# Patient Record
Sex: Male | Born: 1955 | Race: White | Hispanic: No | Marital: Married | Smoking: Former smoker
Health system: Southern US, Community
[De-identification: ages and names within clinical notes are randomized; demographics above are authoritative.]

## PROBLEM LIST (undated history)

## (undated) DIAGNOSIS — E291 Testicular hypofunction: Secondary | ICD-10-CM

## (undated) DIAGNOSIS — E785 Hyperlipidemia, unspecified: Secondary | ICD-10-CM

## (undated) DIAGNOSIS — T7840XA Allergy, unspecified, initial encounter: Secondary | ICD-10-CM

## (undated) DIAGNOSIS — K219 Gastro-esophageal reflux disease without esophagitis: Secondary | ICD-10-CM

## (undated) DIAGNOSIS — E669 Obesity, unspecified: Secondary | ICD-10-CM

## (undated) DIAGNOSIS — Z860100 Personal history of colon polyps, unspecified: Secondary | ICD-10-CM

## (undated) DIAGNOSIS — I10 Essential (primary) hypertension: Secondary | ICD-10-CM

## (undated) DIAGNOSIS — K5731 Diverticulosis of large intestine without perforation or abscess with bleeding: Secondary | ICD-10-CM

## (undated) DIAGNOSIS — I4891 Unspecified atrial fibrillation: Secondary | ICD-10-CM

## (undated) HISTORY — DX: Hyperlipidemia, unspecified: E78.5

## (undated) HISTORY — DX: Testicular hypofunction: E29.1

## (undated) HISTORY — DX: Allergy, unspecified, initial encounter: T78.40XA

## (undated) HISTORY — DX: Diverticulosis of large intestine without perforation or abscess with bleeding: K57.31

## (undated) HISTORY — PX: COLONOSCOPY: SHX174

## (undated) HISTORY — DX: Personal history of colon polyps, unspecified: Z86.0100

## (undated) HISTORY — DX: Personal history of colonic polyps: Z86.010

## (undated) HISTORY — DX: Essential (primary) hypertension: I10

## (undated) HISTORY — DX: Unspecified atrial fibrillation: I48.91

## (undated) HISTORY — DX: Gastro-esophageal reflux disease without esophagitis: K21.9

## (undated) HISTORY — PX: OTHER SURGICAL HISTORY: SHX169

## (undated) HISTORY — DX: Obesity, unspecified: E66.9

---

## 2002-08-01 ENCOUNTER — Encounter: Admission: RE | Admit: 2002-08-01 | Discharge: 2002-10-30 | Payer: Self-pay | Admitting: Family Medicine

## 2006-07-04 ENCOUNTER — Ambulatory Visit: Payer: Self-pay | Admitting: Family Medicine

## 2006-08-10 ENCOUNTER — Ambulatory Visit: Payer: Self-pay | Admitting: Family Medicine

## 2006-08-18 ENCOUNTER — Ambulatory Visit: Payer: Self-pay | Admitting: Gastroenterology

## 2006-12-26 HISTORY — PX: LAPAROSCOPIC GASTRIC BANDING: SHX1100

## 2007-01-01 ENCOUNTER — Ambulatory Visit: Payer: Self-pay | Admitting: Gastroenterology

## 2007-01-15 ENCOUNTER — Encounter (INDEPENDENT_AMBULATORY_CARE_PROVIDER_SITE_OTHER): Payer: Self-pay | Admitting: Specialist

## 2007-01-15 ENCOUNTER — Ambulatory Visit: Payer: Self-pay | Admitting: Gastroenterology

## 2007-01-15 LAB — HM COLONOSCOPY: HM Colonoscopy: NORMAL

## 2007-04-02 ENCOUNTER — Ambulatory Visit (HOSPITAL_BASED_OUTPATIENT_CLINIC_OR_DEPARTMENT_OTHER): Admission: RE | Admit: 2007-04-02 | Discharge: 2007-04-02 | Payer: Self-pay | Admitting: Pediatrics

## 2007-04-03 ENCOUNTER — Ambulatory Visit: Payer: Self-pay | Admitting: Family Medicine

## 2007-04-08 ENCOUNTER — Ambulatory Visit: Payer: Self-pay | Admitting: Internal Medicine

## 2007-04-30 ENCOUNTER — Ambulatory Visit: Payer: Self-pay | Admitting: Internal Medicine

## 2007-05-29 ENCOUNTER — Ambulatory Visit: Payer: Self-pay | Admitting: Family Medicine

## 2007-06-05 ENCOUNTER — Ambulatory Visit (HOSPITAL_COMMUNITY): Admission: RE | Admit: 2007-06-05 | Discharge: 2007-06-05 | Payer: Self-pay | Admitting: *Deleted

## 2007-06-06 ENCOUNTER — Encounter: Admission: RE | Admit: 2007-06-06 | Discharge: 2007-06-06 | Payer: Self-pay | Admitting: *Deleted

## 2007-06-11 ENCOUNTER — Ambulatory Visit (HOSPITAL_COMMUNITY): Admission: RE | Admit: 2007-06-11 | Discharge: 2007-06-11 | Payer: Self-pay | Admitting: *Deleted

## 2007-06-11 ENCOUNTER — Ambulatory Visit: Payer: Self-pay | Admitting: Internal Medicine

## 2007-08-14 ENCOUNTER — Encounter: Admission: RE | Admit: 2007-08-14 | Discharge: 2007-11-12 | Payer: Self-pay | Admitting: *Deleted

## 2007-08-28 ENCOUNTER — Ambulatory Visit (HOSPITAL_COMMUNITY): Admission: RE | Admit: 2007-08-28 | Discharge: 2007-08-29 | Payer: Self-pay | Admitting: *Deleted

## 2007-09-11 ENCOUNTER — Encounter: Admission: RE | Admit: 2007-09-11 | Discharge: 2007-12-10 | Payer: Self-pay | Admitting: *Deleted

## 2007-10-08 DIAGNOSIS — J309 Allergic rhinitis, unspecified: Secondary | ICD-10-CM

## 2007-10-08 DIAGNOSIS — G4733 Obstructive sleep apnea (adult) (pediatric): Secondary | ICD-10-CM

## 2007-10-08 DIAGNOSIS — I10 Essential (primary) hypertension: Secondary | ICD-10-CM

## 2007-10-08 DIAGNOSIS — E669 Obesity, unspecified: Secondary | ICD-10-CM

## 2007-10-08 DIAGNOSIS — Z8679 Personal history of other diseases of the circulatory system: Secondary | ICD-10-CM

## 2007-11-02 ENCOUNTER — Ambulatory Visit: Payer: Self-pay | Admitting: Family Medicine

## 2007-12-26 ENCOUNTER — Encounter: Admission: RE | Admit: 2007-12-26 | Discharge: 2007-12-26 | Payer: Self-pay | Admitting: *Deleted

## 2008-01-24 ENCOUNTER — Ambulatory Visit: Payer: Self-pay | Admitting: Internal Medicine

## 2008-01-27 DIAGNOSIS — I4891 Unspecified atrial fibrillation: Secondary | ICD-10-CM

## 2008-01-27 DIAGNOSIS — G4730 Sleep apnea, unspecified: Secondary | ICD-10-CM | POA: Insufficient documentation

## 2008-02-05 ENCOUNTER — Ambulatory Visit: Payer: Self-pay | Admitting: Family Medicine

## 2008-03-27 ENCOUNTER — Encounter: Admission: RE | Admit: 2008-03-27 | Discharge: 2008-03-27 | Payer: Self-pay | Admitting: *Deleted

## 2008-07-29 ENCOUNTER — Ambulatory Visit: Payer: Self-pay | Admitting: Family Medicine

## 2008-08-15 ENCOUNTER — Ambulatory Visit: Payer: Self-pay | Admitting: Family Medicine

## 2008-08-29 ENCOUNTER — Ambulatory Visit: Payer: Self-pay | Admitting: Family Medicine

## 2008-10-03 ENCOUNTER — Ambulatory Visit: Payer: Self-pay | Admitting: Family Medicine

## 2008-10-06 ENCOUNTER — Ambulatory Visit: Payer: Self-pay | Admitting: Family Medicine

## 2009-01-20 ENCOUNTER — Ambulatory Visit: Payer: Self-pay | Admitting: Internal Medicine

## 2009-12-14 ENCOUNTER — Ambulatory Visit: Payer: Self-pay | Admitting: Family Medicine

## 2010-02-10 ENCOUNTER — Ambulatory Visit: Payer: Self-pay | Admitting: Family Medicine

## 2010-11-24 ENCOUNTER — Ambulatory Visit: Payer: Self-pay | Admitting: Family Medicine

## 2011-02-24 HISTORY — PX: ENDOVENOUS ABLATION SAPHENOUS VEIN W/ LASER: SUR449

## 2011-05-10 NOTE — Op Note (Signed)
NAMEHERNANDEZ, LOSASSO NO.:  [id number]   MEDICAL RECORD NO.:  [id number]          PATIENT TYPE:  AMB   LOCATION:  DAY                          FACILITY:  [facility]   PHYSICIAN:  Alfonse Ras, MD   DATE OF BIRTH:  01/27/1956   DATE OF PROCEDURE:  08/28/2007  DATE OF DISCHARGE:                               OPERATIVE REPORT   PREOPERATIVE DIAGNOSIS:  Medically refractory morbid obesity.   POSTOPERATIVE DIAGNOSIS:  Medically refractory morbid obesity.   PROCEDURE:  Laparoscopic adjustable gastric banding with the APL system.   ANESTHESIA:  General.   SURGEON:  Dr. Baruch Merl.   ASSISTANT:  Dr. Ovidio Kin.   DESCRIPTION:  The patient was taken to the operating room, placed in a  supine position.  After adequate general anesthesia was induced using  endotracheal tube, the abdomen was prepped and draped in normal sterile  fashion using a 11 mm trocar in the left upper quadrant under direct  vision, peritoneal access was obtained.  Pneumoperitoneum was obtained.  Under direct vision a 15 mm trocar was placed in the left upper quadrant  and additional 11 mm trocar was placed in the right upper quadrant.  Additional 11 mm trocar was placed in the right midabdomen.  The  [facility] liver retractor was placed in subxiphoid region and the left  lateral segment of the liver was retracted anteriorly.  Additional 5 mm  trocar was placed in the left abdomen.  The angle of His was identified  sharply and bluntly dissected.  An area on the lesser curve was  identified and the gastrohepatic ligament was incised.  The area of  crossing fat was identified at the right crus.  The band passer was then  placed in a pars flaccida technique in the retrogastric position and  brought out at the angle of His.  There was fairly large fat pad  overlying the anterior surface of the EG junction and this was taken  down and excised with Bovie electrocautery.  The APL band was then  placed through the 15 mm port and passed around the stomach.  This was  closed down around the sizing tube which had been passed orally.  Plication sutures were then placed anteriorly x3 with 2-0 Ethibond.  Satisfied with placement of the band, the tubing was removed through the  11 mm port in the right abdomen.  Pneumoperitoneum was released.  Trocars were removed.  The tubing was connected to the port and the port  was attached to the anterior abdominal wall fascia with interrupted 2-0  Prolene sutures and lay in good position.  The wounds were irrigated and  injected with 0.5 Marcaine.  Skin incisions were closed with staples.  Sterile dressings were applied.  The patient tolerated the procedure  well, went to PACU in good condition.      Alfonse Ras, MD  Electronically Signed     KRE/MEDQ  D:  08/28/2007  T:  08/28/2007  Job:  [id number]

## 2011-05-10 NOTE — Assessment & Plan Note (Signed)
[facility]                             PULMONARY OFFICE NOTE   NAME:[name], [name]                       MRN:          [id number]  DATE:06/11/2007                            DOB:          1956-02-04    PROBLEM:  1. Severe obstructive apnea.  2. Exogenous obesity.  3. Allergic rhinitis.  4. Hypertension.  5. History of atrial fibrillation/sinus rhythm.   HISTORY:  He returns for followup reporting he is doing very well on  fixed CPAP at 10 CWP.  He is quite sure he sleeps better, and feels  better during the day.  His wife is happy he is not snoring, and he has  no particular complaints.  He is using a nasal pillows mask style, and  we reviewed options.  He is being evaluated for lap band bariatric  surgery by Dr. Colin Benton, and we discussed how his sleep apnea likely would  change with weight loss.   MEDICATIONS:  1. Caduet 20/10.  2. Prilosec OTC.  3. AndroGel.  4. Asmanex 1 puff daily.  5. Nasonex p.r.n.  6. CPAP at 10 CWP through [facility].  7. Proventil p.r.n.  8. Singulair p.r.n.   DRUG INTOLERANT TO CODEINE.   OBJECTIVE:  Weight 315 pounds.  BP 132/88.  Pulse 75.  Room air  saturation 96%.  He is substantially overweight.  Very alert.  There are no pressure marks from his CPAP on his face.  Nose is not  obstructed.  LUNGS:  Clear.  HEART:  Sounds regular and normal.   IMPRESSION:  Obstructive sleep apnea with a good start on CPAP at 10  CWP.   PLAN:  1. Weight loss.  2. Schedule return in 6 months, earlier p.r.n.     Clinton D. Maple Hudson, MD, Tonny Bollman, FACP  Electronically Signed    CDY/MedQ  DD: 06/12/2007  DT: 06/13/2007  Job #: [id number]   cc:   Stephannie Li, M.D.  Alfonse Ras, MD

## 2011-05-13 NOTE — Assessment & Plan Note (Signed)
[facility]                             PULMONARY OFFICE NOTE   NAME:[name], [name]                       MRN:          [id number]  DATE:04/30/2007                            DOB:          November 28, 1956    SLEEP MEDICINE CONSULTATION:   PROBLEM:  This 55 year old man comes with his wife in sleep medicine  consultation at the kind request of Dr. Beaulah Dinning with concern of  obstructive sleep apnea.   HISTORY:  His wife says he has always been a loud snorer but has gotten  much worse in recent years.  She also notices him struggling to breathe  and having obvious apneas repeatedly in sleep.  He is having to fight  daytime sleepiness, and she says he will fall asleep any time he sits in  a chair for more than a few minutes.  He has gained about 40 pounds in  recent years and has failed multiple diet efforts.  He has been to an  information talk and is considering exploring bariatric surgery.  A  nocturnal polysomnogram on April 02, 2007, at the [facility]  documented severe obstructive apnea with an index of 71 per hour and  snoring with desaturation to 73%.  Bedtime is between 10 and 12 p.m.,  falling asleep very quickly.  He estimate waking 2-3 times during the  night but usually has no awareness of any difficulty with breathing or  any awareness of his own snoring.  He is usually up between 6 and 7 a.m.   MEDICATIONS:  1. Caduet 20/10 mg.  2. Prilosec.  3. AndroGel.  4. Asmanex one puff.  5. Nasonex.  6. Proventil rescue inhaler.  7. Singulair.   Drug-intolerant to codeine.   REVIEW OF SYSTEMS:  A 40-pound weight gain in recent years.  Chronic  sleepiness, loud snoring and witnessed apneas as described.  Nonrestorative sleep.  Some indigestion without frank reflux or  heartburn.  Bruxism.  He denies chest pain or palpitation, significant  cough or wheeze.  Nasal congestion is being addressed by Dr. Beaulah Dinning.   PAST HISTORY:  1.  Hypertension.  2. Interval of atrial fibrillation 6 or 7 years ago, which apparently      resolved spontaneously.  3. Elevated cholesterol.  4. Nasal fractures twice, reset.  5. Allergic rhinitis and at least some degree of asthma, followed by      Dr. Beaulah Dinning.  6. He has not had tonsillectomy.  7. 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   SOCIAL HISTORY:  Quit smoking 5 years ago.  Occasional alcohol.  Married  with children.   FAMILY HISTORY:  Father snored, died of emphysema.  Maternal grandfather  had heart disease.  Mother with cancer and rheumatism.  Nobody known to  have sleep problems other probably than his father.   OBJECTIVE:  VITAL SIGNS:  Weight 320 pounds, BP 132/90, pulse 77, room  air saturation 95%.  GENERAL:  This is an obese, alert, pleasant gentleman.  NEUROLOGIC:  Unremarkable to observation.  Speech is clear.  HEENT:  Moderate bilateral nasal crusting.  There is slight external  deviation of his nose.  No periorbital edema or conjunctival injection.  Voice quality is normal with no visible postnasal drainage.  Palate  spacing is 3-4/4.  NECK:  Neck veins are not distended.  CHEST:  Quiet, clear, unlabored breathing.  CARDIAC:  Heart sounds regular without murmur or gallop.  EXTREMITIES:  No cyanosis, clubbing, edema or tremor.   IMPRESSION:  1. Severe obstructive apnea with an index of 71 per hour on April 02, 2007.  2. Exogenous obesity.  3. Allergic rhinitis.  4. Hypertension.  5. History of atrial fibrillation, converted spontaneously.   PLAN:  1. We discussed options.  I discussed the physiology, medical concerns      and available treatments for sleep apnea, emphasizing the      importance of weight loss and his responsibility to drive safely.  2. We are arranging a CPAP titration trial with discussion of CPAP      expectations.  He will return in a month, earlier p.r.n., for       follow-up.   I appreciate the chance to meet him.     Charles D. Maple Hudson, MD, Tonny Bollman, FACP  Electronically Signed    CDY/MedQ  DD: 04/30/2007  DT: 05/01/2007  Job #: [id number]   cc:   Stephannie Li, M.D.

## 2011-05-13 NOTE — Assessment & Plan Note (Signed)
HEALTHCARE                           GASTROENTEROLOGY OFFICE NOTE   NAME:[name], [name]                       MRN:          [id number]  DATE:08/18/2006                            DOB:          17-Jul-1956    New patient evaluation.   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 Susann Givens for a screening colonoscopy.   [name] has suffered from extreme obesity and has planned bariatric  surgery.  He is also followed by Dr. Susann Givens because of hypogonadism and  decreased libido and is on AndroGel.  He denies any lower GI complaints.  He  is having regular bowel movements without melena or hematochezia.  He has  had chronic acid reflux for at least five years and takes daily Prilosec  with good response.  Most of his problems are burning substernal chest pain  and regurgitation but no dysphagia.  He has not had endoscopy or barium  studies of his bowels.  He denies specific food intolerances.  He has never  had hepatitis, pancreatitis, or hepatobiliary problems.  He has gained 100  pounds over the last 10 years.   PAST MEDICAL HISTORY:  Remarkable for hypertension and adult onset asthmatic  bronchitis.  He also suffers from hypercholesterolemia.   CURRENT MEDICATIONS:  1. Caduet 20/10 mg a day.  2. Over-the-counter Prilosec.  3. AndroGel.  4. Asmanex one puff daily.   In the past he has had reactions to CODEINE with nausea and vomiting.   FAMILY HISTORY:  Noncontributory.   SOCIAL HISTORY:  Patient is married, lives with his wife.  He has a Systems analyst.  He does not smoke or abuse ethanol.   REVIEW OF SYSTEMS:  Otherwise noncontributory.  He has had no blood in his  stools on hemoccult testing, and recent lab data per Dr. Susann Givens showed a  normal CBC with a hemoglobin of 14.8 and a normal metabolic profile.   PHYSICAL EXAMINATION:  VITAL SIGNS:  He is 6 feet, 1 inch tall and weighs  313  pounds.  Blood pressure 128/80 and pulse was 64 and regular.  A healthy-appearing white male in no acute distress.  His stated age.  I  could not appreciate stigmata of chronic liver disease.  CHEST:  Entirely clear without wheezes or rhonchi.  CARDIAC:  No murmurs, gallops or rubs.  Appeared to be in a regular rhythm.  ABDOMEN:  Somewhat obese but I could not appreciate definite organomegaly,  masses, or tenderness.  PERIPHERAL EXTREMITIES:  Unremarkable.  MENTAL STATUS:  Normal.   ASSESSMENT:  1. Chronic acid reflux, asymptomatic on daily PPI therapy - rule out      Barrett's mucosa.  This patient would be a good prototype for the      development of Barrett's mucosa because of his ethnicity and the      longevity of his acid reflux.  2. Need for screening colonoscopy because of his age.  3. Exogenous obesity with planned possible gastric bypass surgery.  4. Mild, well-controlled essential hypertension.  5. History of hyperlipidemia.  6. History of asthmatic bronchitis, perhaps GERD related.   RECOMMENDATIONS:  1. Outpatient endoscopy and colonoscopy at his convenience.  2. Reflux regime reviewed with patient and will continue with daily PPI      therapy.  3. Continue medications and followup with Dr. Susann Givens is otherwise      planned.                                   Vania Rea. Jarold Motto, MD, Clementeen Graham, [place]   DRP/MedQ  DD:  08/18/2006  DT:  08/18/2006  Job #:  [id number]   cc:   Sharlot Gowda, MD

## 2011-05-13 NOTE — Procedures (Signed)
NAME:  [name], [name] NO.:  [id number]   MEDICAL RECORD NO.:  [id number]          PATIENT TYPE:  OUT   LOCATION:  SLEEP CENTER                 [facility]ACILITY[facility]  [facility]   PHYSICIAN:  Clinton D. Maple Hudson, MD, FCCP, FACPDATE OF BIRTH:  08-12-1956   DATE OF STUDY:  04/02/2007                            NOCTURNAL POLYSOMNOGRAM   REFERRING PHYSICIAN:  Stephannie Li, M.D.   INDICATION FOR STUDY:  Hypersomnia with sleep apnea.   EPWORTH SLEEPINESS SCORE:  17/24, BMI 40.3, weight 315 pounds.   MEDICATIONS:  Home medications are listed and reviewed.  The diagnostic  NPSG protocol was requested.   SLEEP ARCHITECTURE:  Total sleep time 290 minutes with sleep efficiency  79%.  Stage I was 8%, stage II 85%, stages III and IV 1%, REM 6% of  total sleep time.  Sleep latency 31 minutes, REM latency 70 minutes,  awake after sleep onset 46 minutes, arousal index 11.  Asmanex and  Nasonex were taken at 10:45 p.m.  Caduet, Prilosec and AndroGel were  taken at 5:50 a.m.   RESPIRATORY DATA:  Diagnostic NPSG protocol.  Apnea-hypopnea index (AHI,  RDI) 71 obstructive events per hour, indicating severe obstructive sleep  apnea/hypopnea syndrome.  There were 236 obstructive apneas and 107  hypopneas.  Events were most common while supine but significant in all  sleep positions.  REM AHI 46.7.   OXYGEN DATA:  Snoring with oxygen desaturation to a nadir of 73%.  Mean  oxygen saturation through the study was 90% on room air.   CARDIAC DATA:  Normal sinus rhythm.   MOVEMENT-PARASOMNIA:  No significant movement disturbance noted.   IMPRESSIONS-RECOMMENDATIONS:  1. Severe obstructive sleep apnea/hypopnea syndrome, apnea-hypopnea      index 71 per hour with events most common while supine but      significant in all positions.  Snoring with oxygen desaturation to      a nadir of 73%.  2. Consider return for continuous positive airway pressure titration      or evaluation for alternative therapies  as appropriate.      Clinton D. Maple Hudson, MD, [facility], FACP  Diplomate, Biomedical engineer of Sleep Medicine  Electronically Signed     CDY/MEDQ  D:  04/08/2007 08:31:26  T:  04/08/2007 09:11:16  Job:  [id number]

## 2011-06-07 ENCOUNTER — Other Ambulatory Visit: Payer: Self-pay

## 2011-06-07 MED ORDER — TESTOSTERONE 20.25 MG/ACT (1.62%) TD GEL: 2.0000 | Freq: Every day | TRANSDERMAL | Status: DC

## 2011-06-08 ENCOUNTER — Telehealth: Payer: Self-pay | Admitting: Family Medicine

## 2011-06-08 NOTE — Telephone Encounter (Signed)
Pt called needs Androgel refill  1.62  2 pumps per day   [employer].   Pt has cpe scheduled 07/01/11

## 2011-06-13 ENCOUNTER — Encounter: Payer: Self-pay | Admitting: Family Medicine

## 2011-06-30 ENCOUNTER — Encounter: Payer: Self-pay | Admitting: Family Medicine

## 2011-07-01 ENCOUNTER — Ambulatory Visit (INDEPENDENT_AMBULATORY_CARE_PROVIDER_SITE_OTHER): Payer: BC Managed Care – PPO | Admitting: Family Medicine

## 2011-07-01 ENCOUNTER — Encounter: Payer: Self-pay | Admitting: Family Medicine

## 2011-07-01 VITALS — BP 130/88 | HR 62 | Ht 73.0 in | Wt 262.0 lb

## 2011-07-01 DIAGNOSIS — M461 Sacroiliitis, not elsewhere classified: Secondary | ICD-10-CM

## 2011-07-01 DIAGNOSIS — E291 Testicular hypofunction: Secondary | ICD-10-CM

## 2011-07-01 DIAGNOSIS — I83893 Varicose veins of bilateral lower extremities with other complications: Secondary | ICD-10-CM

## 2011-07-01 DIAGNOSIS — G4730 Sleep apnea, unspecified: Secondary | ICD-10-CM

## 2011-07-01 DIAGNOSIS — Z0000 Encounter for general adult medical examination without abnormal findings: Secondary | ICD-10-CM

## 2011-07-01 DIAGNOSIS — E559 Vitamin D deficiency, unspecified: Secondary | ICD-10-CM

## 2011-07-01 DIAGNOSIS — I83811 Varicose veins of right lower extremities with pain: Secondary | ICD-10-CM

## 2011-07-01 DIAGNOSIS — J45909 Unspecified asthma, uncomplicated: Secondary | ICD-10-CM

## 2011-07-01 DIAGNOSIS — J301 Allergic rhinitis due to pollen: Secondary | ICD-10-CM

## 2011-07-01 DIAGNOSIS — K219 Gastro-esophageal reflux disease without esophagitis: Secondary | ICD-10-CM

## 2011-07-01 LAB — CBC WITH DIFFERENTIAL/PLATELET
Basophils Absolute: 0 10*3/uL (ref 0.0–0.1)
Basophils Relative: 0 % (ref 0–1)
Eosinophils Absolute: 0.1 10*3/uL (ref 0.0–0.7)
Eosinophils Relative: 2 % (ref 0–5)
HCT: 46.9 % (ref 39.0–52.0)
Hemoglobin: 14.9 g/dL (ref 13.0–17.0)
MCH: 28.9 pg (ref 26.0–34.0)
MCHC: 31.8 g/dL (ref 30.0–36.0)
MCV: 91.1 fL (ref 78.0–100.0)
Monocytes Absolute: 0.8 10*3/uL (ref 0.1–1.0)
Monocytes Relative: 11 % (ref 3–12)
RDW: 13.3 % (ref 11.5–15.5)

## 2011-07-01 LAB — POCT URINALYSIS DIPSTICK
Bilirubin, UA: NEGATIVE
Leukocytes, UA: NEGATIVE
Nitrite, UA: NEGATIVE
Protein, UA: NEGATIVE
Urobilinogen, UA: NEGATIVE
pH, UA: 5

## 2011-07-01 LAB — COMPREHENSIVE METABOLIC PANEL
AST: 21 U/L (ref 0–37)
Albumin: 4.3 g/dL (ref 3.5–5.2)
Alkaline Phosphatase: 49 U/L (ref 39–117)
BUN: 15 mg/dL (ref 6–23)
Creat: 1.12 mg/dL (ref 0.50–1.35)
Glucose, Bld: 91 mg/dL (ref 70–99)

## 2011-07-01 LAB — LIPID PANEL
HDL: 49 mg/dL (ref >39)
LDL Cholesterol: 143 mg/dL — ABNORMAL HIGH (ref 0–99)
Total CHOL/HDL Ratio: 4.5 Ratio
Triglycerides: 143 mg/dL (ref <150)

## 2011-07-01 LAB — PSA: PSA: 0.4 ng/mL (ref <=4.00)

## 2011-07-01 MED ORDER — ALBUTEROL SULFATE HFA 108 (90 BASE) MCG/ACT IN AERS: 2.0000 | INHALATION_SPRAY | Freq: Four times a day (QID) | RESPIRATORY_TRACT | Status: DC | PRN

## 2011-07-01 NOTE — Progress Notes (Signed)
Subjective:    Patient ID: [name], male    DOB: 1956-07-06, 55 y.o.   MRN: [id number]  HPI He is here for a complete examination. He has an underlying history of allergies however this is under good control. He also has reflux disease and again is having no difficulty with this. He continues on his blood pressure medication. He continues to slowly lose weight. He did have lap band surgery. He is on multiple over-the-counter medications mainly vitamins. He occasionally uses his Proventil for treatment of asthma. This occurs once or twice per year. He continues on testosterone replacement and is having no difficulty with this. His family and social history was reviewed. He does have one of his children living with him. He does complain of some right-sided back pain as well as painful varicosities on the right. He has a previous history of vitamin D deficiency. He has a history of sleep apnea however since his weight reduction, he has had less difficulty with this.   Review of Systems  Constitutional: Positive for activity change. Negative for appetite change.  HENT: Negative.   Eyes: Negative.   Respiratory: Positive for apnea.   Cardiovascular: Negative.   Gastrointestinal: Negative.   Genitourinary: Negative.   Musculoskeletal: Positive for back pain.  Skin: Negative.   Neurological: Negative.   Psychiatric/Behavioral: Negative.        Objective:   Physical Exam BP 130/88  Pulse 62  Ht 6\' 1"  (1.854 m)  Wt 262 lb (118.842 kg)  BMI 34.57 kg/m2  General Appearance:    Alert, cooperative, no distress, appears stated age  Head:    Normocephalic, without obvious abnormality, atraumatic  Eyes:    PERRL, conjunctiva/corneas clear, EOM's intact, fundi    benign  Ears:    Normal TM's and external ear canals  Nose:   Nares normal, mucosa normal, no drainage or sinus   tenderness  Throat:   Lips, mucosa, and tongue normal; teeth and gums normal  Neck:   Supple, no lymphadenopathy;   thyroid:  no   enlargement/tenderness/nodules; no carotid   bruit or JVD  Back:    Spine nontender, no curvature, ROM normal, no CVA     tenderness  Lungs:     Clear to auscultation bilaterally without wheezes, rales or     ronchi; respirations unlabored  Chest Wall:    No tenderness or deformity   Heart:    Regular rate and rhythm, S1 and S2 normal, no murmur, rub   or gallop  Breast Exam:    No chest wall tenderness, masses or gynecomastia  Abdomen:     Soft, non-tender, nondistended, normoactive bowel sounds,    no masses, no hepatosplenomegaly  Genitalia:    Normal male external genitalia without lesions.  Testicles without masses.  No inguinal hernias.      Extremities:   No clubbing, cyanosis or edema.painful varicosity noted on the right medial calf.   Pulses:   2+ and symmetric all extremities  Skin:   Skin color, texture, turgor normal, no rashes or lesions  Lymph nodes:   Cervical, supraclavicular, and axillary nodes normal  Neurologic:   CNII-XII intact, normal strength, sensation and gait; reflexes 2+ and symmetric throughout          Psych:   Normal mood, affect, hygiene and grooming.   Exam of his back does show tenderness with palpation over the right SI joint with Pearlean Brownie and Stork test positive.       Assessment &  Plan:  Allergic rhinitis. GERD. Hypertension. Obesity. Asthma. Sacroiliitis. Hypogonadism. Painful varicosity. Sleep apnea He will be sent for evaluation of varicose vein ablation. I will set him up for an apnea linked to evaluate his sleep apnea. Routine blood screening. Also discussed stretching and exercises for his back. Recommend he do this 3 times per day. Ventolin was renewed. Routine blood screening including PSA and testosterone.

## 2011-07-01 NOTE — Patient Instructions (Signed)
I will call with the results of your blood work. Do the exercises as planned. Continue with weight reduction.

## 2011-07-13 ENCOUNTER — Telehealth: Payer: Self-pay

## 2011-07-13 NOTE — Telephone Encounter (Signed)
Called pt he has appt Thursday 19

## 2011-07-14 ENCOUNTER — Encounter: Payer: Self-pay | Admitting: Family Medicine

## 2011-07-14 ENCOUNTER — Ambulatory Visit (INDEPENDENT_AMBULATORY_CARE_PROVIDER_SITE_OTHER): Payer: BC Managed Care – PPO | Admitting: Family Medicine

## 2011-07-14 DIAGNOSIS — G4730 Sleep apnea, unspecified: Secondary | ICD-10-CM

## 2011-07-14 NOTE — Progress Notes (Signed)
  Subjective:    Patient ID: [name], male    DOB: 1956/09/06, 55 y.o.   MRN: [id number]  HPI He is here for consult concerning his sleep apnea. He has a previous history of difficulty with this however he did have stomach stapling procedure and did lose some weight. He apparently then stop the CPAP based on the fact that he was having less difficulty with snoring. Recent study did show evidence of sleep apnea with an AHI of 21 and 20% time spent with saturation less than 90%.   Review of Systems     Objective:   Physical Exam Alert and in no distress otherwise not examined       Assessment & Plan:  Sleep apnea. Obesity. He is to start that on the CPAP machine and set up for auto titrate. Recommend followup evaluation in approximately one month.

## 2011-07-14 NOTE — Patient Instructions (Signed)
Get all your supplies and when you get everything set up, call me so we can get a repeat study in check her oxygen level

## 2011-08-18 ENCOUNTER — Encounter: Payer: Self-pay | Admitting: Vascular Surgery

## 2011-08-25 ENCOUNTER — Other Ambulatory Visit: Payer: Self-pay | Admitting: Family Medicine

## 2011-08-25 MED ORDER — LISINOPRIL-HYDROCHLOROTHIAZIDE 10-12.5 MG PO TABS: 1.0000 | ORAL_TABLET | Freq: Every day | ORAL | Status: DC

## 2011-08-25 MED ORDER — TESTOSTERONE 20.25 MG/ACT (1.62%) TD GEL: 2.0000 | Freq: Every day | TRANSDERMAL | Status: DC

## 2011-08-25 NOTE — Telephone Encounter (Signed)
Not for sure what the source is discontinued

## 2011-09-23 ENCOUNTER — Encounter: Payer: Self-pay | Admitting: Vascular Surgery

## 2011-09-26 ENCOUNTER — Encounter: Payer: Self-pay | Admitting: Vascular Surgery

## 2011-09-26 ENCOUNTER — Ambulatory Visit (INDEPENDENT_AMBULATORY_CARE_PROVIDER_SITE_OTHER): Payer: BC Managed Care – PPO | Admitting: *Deleted

## 2011-09-26 ENCOUNTER — Ambulatory Visit (INDEPENDENT_AMBULATORY_CARE_PROVIDER_SITE_OTHER): Payer: BC Managed Care – PPO | Admitting: Vascular Surgery

## 2011-09-26 VITALS — BP 137/94 | HR 64 | Resp 20 | Ht 74.0 in | Wt 273.0 lb

## 2011-09-26 DIAGNOSIS — I83893 Varicose veins of bilateral lower extremities with other complications: Secondary | ICD-10-CM

## 2011-09-26 DIAGNOSIS — I8310 Varicose veins of unspecified lower extremity with inflammation: Secondary | ICD-10-CM

## 2011-09-26 NOTE — Progress Notes (Signed)
Varicose veins right leg

## 2011-09-26 NOTE — Progress Notes (Signed)
Subjective:     Patient ID: [name], male   DOB: 21-Mar-1956, 55 y.o.   MRN: [id number]  [facility] HISTORY AND PHYSICAL  Referring Physician: Dr. Susann Givens History of Present Illness: This 55 year old male patient was referred for evaluation of severe venous insufficiency of the right leg with painful varicosities for the past 10-15 years he has had varicose veins in the right medial and posterior calf area which have become increasingly symptomatic. He describes an aching burning hypersensitive feeling as well as a hot sensation surrounding these veins which increased as the day progresses the he has no history of DVT, thrombophlebitis, stasis ulcers, bleeding or he does not wear elastic compression stockings. His symptoms worsened as the day progresses. He has no symptoms in the contralateral left leg. Past Medical History  Diagnosis Date  . Allergy     RHINITIS  . Dyslipidemia   . GERD (gastroesophageal reflux disease)   . Hypertension   . Obesity   . Asthma   . Hypogonadism male   . Atrial fibrillation   . History of colonic polyps   . Vitamin D deficiency     ROS: [x]  Positive   [ ]  Negative   [ ]  All sytems reviewed and are negative  General: [x ] Weight loss, [ ]  Fever, [ ]  chills Neurologic: [ ]  Dizziness, [ ]  Blackouts, [ ]  Seizure [ ]  Stroke, [ ]  "Mini stroke", [ ]  Slurred speech, [ ]  Temporary blindness; [ ]  weakness in arms or legs, [ ]  Hoarseness Cardiac: [ ]  Chest pain/pressure, [ ]  Shortness of breath at rest [ ]  Shortness of breath with exertion, [ ]  Atrial fibrillation or irregular heartbeat Vascular: [x Pain in legs with walking, [ ]  Pain in legs at rest, [ ]  Pain in legs at night,  [ ]  Non-healing ulcer, [ ]  Blood clot in vein/DVT,   Pulmonary: [ ]  Home oxygen, [ ]  Productive cough, [ ]  Coughing up blood, [ ]  Asthma,  [ ]  Wheezing Musculoskeletal:  [ ]  Arthritis, [ ]  Low back pain, [ ]  Joint pain Hematologic: [ ]  Easy Bruising,  [ ]  Anemia; [ ]  Hepatitis Gastrointestinal: [ ]  Blood in stool, [ ]  Gastroesophageal Reflux/heartburn, [ ]  Trouble swallowing Urinary: [ ]  chronic Kidney disease, [ ]  on HD - [ ]  MWF or [ ]  TTHS, [ ]  Burning with urination, [ ]  Difficulty urinating Skin: [ ]  Rashes, [ ]  Wounds Psychological: [ ]  Anxiety, [ ]  Depression   Social History History  Substance Use Topics  . Smoking status: Never Smoker   . Smokeless tobacco: Never Used  . Alcohol Use: 0.6 oz/week    1 Glasses of wine per week    Family History Family History  Problem Relation Age of Onset  . Arthritis Mother   . Hypertension Mother   . Ulcers Mother   . Asthma Maternal Aunt   . Heart disease Maternal Uncle   . Stroke Maternal Grandmother   . Heart disease Paternal Grandmother     Allergies  Allergen Reactions  . Codeine     Current Outpatient Prescriptions  Medication Sig Dispense Refill  . aspirin 81 MG tablet Take 81 mg by mouth daily.        Marland Kitchen lisinopril-hydrochlorothiazide (PRINZIDE,ZESTORETIC) 10-12.5 MG per tablet Take 1 tablet by mouth daily.  90 tablet  prn  . Multiple Vitamins-Minerals (MULTIVITAMIN WITH MINERALS) tablet Take 1 tablet by mouth daily.        Marland Kitchen  Testosterone 20.25 MG/ACT (1.62%) GEL Place 2 Squirts onto the skin daily.  75 g  5  . Vitamins A & D (VITAMIN A & D) 10000-400 UNITS CAPS Take by mouth.          Physical Examination  Filed Vitals:   09/26/11 1418  BP: 137/94  Pulse: 64  Resp: 20    Body mass index is 35.05 kg/(m^2).  General:  WDWN in NAD Gait: Normal HENT: WNL Eyes: Pupils equal Pulmonary: normal non-labored breathing , without Rales, rhonchi,  wheezing Cardiac: RRR, without  Murmurs, rubs or gallops; No carotid bruits Abdomen: soft, NT, no masses obese Skin: no rashes, ulcers noted Vascular Exam/Pulses:  Extremities without ischemic changes, no Gangrene , no cellulitis; no open wounds; the right leg has bulging varicosities in the distribution of the great  saphenous vein. These are primarily located in the medial calf and posterior calf area. There are multiple spider and reticular veins in the lower third of the leg concentrated around the ankle. There is 1+ edema. There is no hyperpigmentation or ulceration. left leg is free of varicosities the history plus femoral popliteal and dorsalis pedis pulses palpable bilaterally. Musculoskeletal: no muscle wasting or atrophy  Neurologic: A&O X 3; Appropriate Affect ; SENSATION: normal; MOTOR FUNCTION:  moving all extremities equally. Speech is fluent/normal  Non-Invasive Vascular Imaging: Today I ordered a venous duplex exam of the right lower extremity which I reviewed and interpreted. There is no DVT. There is gross reflux in the right right saphenous vein from the saphenofemoral junction to the knee supplying these bulging varicosities in the calf. Right small saphenous vein does have competent valves. ASSESSMENT: Severe venous insufficiency right leg secondary to valvular incompetence and reflux of right great saphenous system. These are affecting his daily living and increasing fashion. PLAN: #1 long elastic compression stockings 20-30 mm gradient #2 elevation of legs as much as his occupation we'll allow #3 ibuprofen on a daily basis #4 return in 3 months to see if he has any symptomatic improvement. If not he would be a good candidate for laser ablation right great saphenous vein with greater than 20 stab phlebectomy.   Review of Systems     Objective:   Physical Exam     Assessment:       Plan:

## 2011-10-03 NOTE — Procedures (Unsigned)
LOWER EXTREMITY VENOUS REFLUX EXAM  INDICATION:  Right varicose vein.  EXAM:  Using color-flow imaging and pulse Doppler spectral analysis, the right common femoral, superficial femoral, popliteal, posterior tibial, greater and lesser saphenous veins are evaluated.  There is evidence suggesting deep venous insufficiency in the right lower extremity at the common femoral vein.  The right saphenofemoral junction is not competent with Reflux of >536milliseconds. The right GSV is not competent with Reflux of >562milliseconds with the caliber as described below.  The right proximal short saphenous vein demonstrates competency.  GSV Diameter (used if found to be incompetent only)                                           Right    Left Proximal Greater Saphenous Vein           0.55 cm  cm Proximal-to-mid-thigh                     0.54 cm  cm Mid thigh                                 0.56 cm  cm Mid-distal thigh                          cm       cm Distal thigh                              0.56 cm  cm Knee                                      0.43 cm  cm  IMPRESSION: 1. The right greater saphenous vein is not competent with reflux     >575milliseconds. 2. The right greater saphenous vein is tortuous below the knee. 3. The deep venous system is competent. 4. The right lesser saphenous vein is competent.  ___________________________________________ Quita Skye. Hart Rochester, M.D.  EM/MEDQ  D:  09/26/2011  T:  09/26/2011  Job:  [id number]

## 2011-10-07 LAB — DIFFERENTIAL
Basophils Absolute: 0
Eosinophils Absolute: 0
Eosinophils Relative: 0
Lymphs Abs: 2.2
Neutrophils Relative %: 62

## 2011-10-07 LAB — BASIC METABOLIC PANEL
BUN: 13
Calcium: 8.9
Creatinine, Ser: 1.02
GFR calc non Af Amer: >60
Glucose, Bld: 98

## 2011-10-07 LAB — CBC
HCT: 38.3 — ABNORMAL LOW
MCV: 86.8
Platelets: 210
RDW: 13.6
WBC: 7.6

## 2011-10-07 LAB — HEMOGLOBIN AND HEMATOCRIT, BLOOD
HCT: 43.8
Hemoglobin: 14.9

## 2012-01-03 ENCOUNTER — Ambulatory Visit: Payer: BC Managed Care – PPO | Admitting: Vascular Surgery

## 2012-01-09 ENCOUNTER — Encounter: Payer: Self-pay | Admitting: Vascular Surgery

## 2012-01-10 ENCOUNTER — Ambulatory Visit (INDEPENDENT_AMBULATORY_CARE_PROVIDER_SITE_OTHER): Payer: BC Managed Care – PPO | Admitting: Vascular Surgery

## 2012-01-10 ENCOUNTER — Encounter: Payer: Self-pay | Admitting: Vascular Surgery

## 2012-01-10 VITALS — BP 115/78 | HR 68 | Resp 20 | Ht 73.5 in | Wt 272.0 lb

## 2012-01-10 DIAGNOSIS — I83893 Varicose veins of bilateral lower extremities with other complications: Secondary | ICD-10-CM

## 2012-01-10 NOTE — Progress Notes (Signed)
Subjective:     Patient ID: [name], male   DOB: 12-03-1956, 56 y.o.   MRN: [id number]  HPI this 56 year old male patient returns for further evaluation of his severe venous insufficiency of the right leg. He was previously evaluated by me in October of 2012 4 bulging varicosities in the right calf which have become quite hypersensitive with aching throbbing and stinging discomfort as the day progresses. He has been wearing long leg elastic compression stockings (20-30 mm gradient) as well as elevating his legs as much as possible and trying ibuprofen. This is not improved his symptomatology which seems to be worsening. He also has edema in the right leg and ankle as the day progresses.  Past Medical History  Diagnosis Date  . Allergy     RHINITIS  . Dyslipidemia   . GERD (gastroesophageal reflux disease)   . Hypertension   . Obesity   . Asthma   . Hypogonadism male   . Atrial fibrillation   . History of colonic polyps   . Vitamin D deficiency     History  Substance Use Topics  . Smoking status: Never Smoker   . Smokeless tobacco: Never Used  . Alcohol Use: 0.6 oz/week    1 Glasses of wine per week    Family History  Problem Relation Age of Onset  . Arthritis Mother   . Hypertension Mother   . Ulcers Mother   . Asthma Maternal Aunt   . Heart disease Maternal Uncle   . Stroke Maternal Grandmother   . Heart disease Paternal Grandmother     Allergies  Allergen Reactions  . Codeine     Current outpatient prescriptions:aspirin 81 MG tablet, Take 81 mg by mouth daily.  , Disp: , Rfl: ;  lisinopril-hydrochlorothiazide (PRINZIDE,ZESTORETIC) 10-12.5 MG per tablet, Take 1 tablet by mouth daily., Disp: 90 tablet, Rfl: prn;  Multiple Vitamins-Minerals (MULTIVITAMIN WITH MINERALS) tablet, Take 1 tablet by mouth daily.  , Disp: , Rfl: ;  Testosterone 20.25 MG/ACT (1.62%) GEL, Place 2 Squirts onto the skin daily., Disp: 75 g, Rfl: 5 Vitamins A & D (VITAMIN A & D) 10000-400 UNITS  CAPS, Take by mouth.  , Disp: , Rfl:   BP 115/78  Pulse 68  Resp 20  Ht 6' 1.5" (1.867 m)  Wt 272 lb (123.378 kg)  BMI 35.40 kg/m2  Body mass index is 35.40 kg/(m^2).        Review of Systems denies chest pain, dyspnea on exertion, PND, orthopnea, hemoptysis, claudication    Objective:   Physical Exam blood pressure 115/78 heart rate 68 respirations 20 General well-developed well-nourished male no apparent stress alert and oriented x3 HEENT normal for age Lungs no rhonchi or wheezing Lower extremity exam on the right reveals 3+ femoral dorsalis pedis pulse palpable. He has bulging varicosities in the right mid calf extending posteriorly area and he has 1+ edema. There no active ulcerations or hyperpigmentation noted.  He has documented gross reflux in the right great saphenous system supplying these bulging varicosities in the right leg with no DVT    Assessment:     Severe venous insufficiency right great saphenous vein with secondary bulging varicosities-affecting his daily living and not responsive to conservative therapy    Plan:       patient needs a laser ablation right great saphenous vein with 10-20 stab phlebectomy to relieve his symptoms which are affecting his daily living. Will proceed with precertification to perform this in the near future

## 2012-01-11 ENCOUNTER — Other Ambulatory Visit: Payer: Self-pay | Admitting: *Deleted

## 2012-01-11 DIAGNOSIS — I83893 Varicose veins of bilateral lower extremities with other complications: Secondary | ICD-10-CM

## 2012-02-22 ENCOUNTER — Encounter: Payer: Self-pay | Admitting: Gastroenterology

## 2012-02-24 ENCOUNTER — Encounter: Payer: Self-pay | Admitting: Vascular Surgery

## 2012-02-27 ENCOUNTER — Encounter: Payer: Self-pay | Admitting: Vascular Surgery

## 2012-02-27 ENCOUNTER — Ambulatory Visit (INDEPENDENT_AMBULATORY_CARE_PROVIDER_SITE_OTHER): Payer: BC Managed Care – PPO | Admitting: Vascular Surgery

## 2012-02-27 VITALS — BP 126/85 | HR 78 | Resp 16 | Ht 73.5 in | Wt 267.0 lb

## 2012-02-27 DIAGNOSIS — I83893 Varicose veins of bilateral lower extremities with other complications: Secondary | ICD-10-CM

## 2012-02-27 NOTE — Progress Notes (Signed)
Subjective:     Patient ID: [name], male   DOB: [birth year], 56 y.o.   MRN: [id number]  HPI this 56 year old male had laser ablation of the right great saphenous vein from the knee to the saphenofemoral junction under local tumescent anesthesia with 10-20 stab phlebectomy. He tolerated the procedure well. This was done for painful varicosities due to valvular incompetence in the right great saphenous system.   Review of Systems     Objective:   Physical ExamBP 126/85  Pulse 78  Resp 16  Ht 6' 1.5" (1.867 m)  Wt 267 lb (121.11 kg)  BMI 34.75 kg/m2     Assessment:     Successful well-tolerated laser ablation right great saphenous vein with 10-20 stab phlebectomy    Plan:     Return in one week for a venous duplex exam to confirm closure right great saphenous vein.

## 2012-02-27 NOTE — Progress Notes (Signed)
Laser Ablation Procedure      Date: 02/27/2012    [name] DOB:[birth year]  Consent signed: Yes  Surgeon:J.D. Hart Rochester  Procedure: Laser Ablation: right Greater Saphenous Vein  BP 126/85  Pulse 78  Resp 16  Ht 6' 1.5" (1.867 m)  Wt 267 lb (121.11 kg)  BMI 34.75 kg/m2  Start time: 9:00   End time: 10:15  Tumescent Anesthesia: 450 cc 0.9% NaCl with 50 cc Lidocaine HCL with 1% Epi and 15 cc 8.4% NaHCO3  Local Anesthesia: 18 cc Lidocaine HCL and NaHCO3 (ratio 2:1)  Pulsed mode: Watts 15 Seconds 1 Pulses:1 Total Pulses:127 Total Energy: 1905 Total Time: 2:07   Stab Phlebectomy:  Sites: Calf  Patient tolerated procedure well: Yes    Description of Procedure:  After marking the course of the saphenous vein and the secondary varicosities in the standing position, the patient was placed on the operating table in the supine position, and the right leg was prepped and draped in sterile fashion. Local anesthetic was administered, and under ultrasound guidance the saphenous vein was accessed with a micro needle and guide wire; then the micro puncture sheath was placed. A guide wire was inserted to the saphenofemoral junction, followed by a 5 french sheath.  The position of the sheath and then the laser fiber below the junction was confirmed using the ultrasound and visualization of the aiming beam.  Tumescent anesthesia was administered along the course of the saphenous vein using ultrasound guidance. Protective laser glasses were placed on the patient, and the laser was fired at 15 watt pulsed mode advancing 1-2 mm per sec.  For a total of 1905 joules.  A steri strip was applied to the puncture site.  The patient was then put into Trendelenburg position.  Local anesthetic was utilized overlying the marked varicosities.  Ten to 20 stab wounds were made using the tip of an 11 blade; and using the vein hook,  The phlebectomies were performed using a hemostat to avulse these varicosities.   Adequate hemostasis was achieved, and steri strips were applied to the stab wound.    ABD pads and thigh high compression stockings were applied.  Ace wrap bandages were applied over the phlebectomy sites and at the top of the saphenofemoral junction.  Blood loss was less than 15 cc.  The patient ambulated out of the operating room having tolerated the procedure well.

## 2012-02-28 ENCOUNTER — Encounter: Payer: Self-pay | Admitting: Vascular Surgery

## 2012-02-28 ENCOUNTER — Telehealth: Payer: Self-pay | Admitting: *Deleted

## 2012-02-28 NOTE — Telephone Encounter (Signed)
Patient had a good night. No problems. Following all instructions. Reminded him of his fu appt.

## 2012-02-29 ENCOUNTER — Encounter: Payer: Self-pay | Admitting: Internal Medicine

## 2012-03-01 ENCOUNTER — Encounter: Payer: Self-pay | Admitting: Gastroenterology

## 2012-03-02 ENCOUNTER — Encounter: Payer: Self-pay | Admitting: Vascular Surgery

## 2012-03-05 ENCOUNTER — Ambulatory Visit (INDEPENDENT_AMBULATORY_CARE_PROVIDER_SITE_OTHER): Payer: BC Managed Care – PPO | Admitting: Vascular Surgery

## 2012-03-05 ENCOUNTER — Encounter (INDEPENDENT_AMBULATORY_CARE_PROVIDER_SITE_OTHER): Payer: BC Managed Care – PPO | Admitting: *Deleted

## 2012-03-05 ENCOUNTER — Encounter: Payer: Self-pay | Admitting: Vascular Surgery

## 2012-03-05 VITALS — BP 139/85 | HR 59 | Resp 16 | Ht 73.5 in | Wt 262.0 lb

## 2012-03-05 DIAGNOSIS — I83893 Varicose veins of bilateral lower extremities with other complications: Secondary | ICD-10-CM

## 2012-03-05 DIAGNOSIS — Z48812 Encounter for surgical aftercare following surgery on the circulatory system: Secondary | ICD-10-CM

## 2012-03-05 NOTE — Progress Notes (Signed)
Subjective:     Patient ID: [name], male   DOB: 18-Nov-1956, 56 y.o.   MRN: [id number]  HPI this 56 year old male returns 1 week post laser ablation right great saphenous vein with multiple stab phlebectomy of secondary varicosities. He has had mild discomfort along the course of the great saphenous vein. He said no edema. He said no chest pain or hemoptysis. He did take ibuprofen for a few days and discontinue it and is wearing his elastic compression stockings until 2 days ago when he discontinued them.  Review of Systems     Objective:   Physical ExamBP 139/85  Pulse 59  Resp 16  Ht 6' 1.5" (1.867 m)  Wt 262 lb (118.842 kg)  BMI 34.10 kg/m2 Right lower extremity has moderate ecchymosis between the saphenofemoral junction in the distal thigh medially. There is mild tenderness along the course of the great saphenous vein. There is no distal edema. Stab phlebectomy sites in the calf and thigh are healing nicely.  Today I ordered a venous duplex exam of the right leg which are reviewed and interpreted. There is total closure of the right great saphenous vein with no DVT.    Assessment:     Well-tolerated and successful laser ablation right great saphenous vein with multiple stab phlebectomy for painful varicosities    Plan:     Will wear stockings for one further week and return to see me on when necessary basis

## 2012-03-06 ENCOUNTER — Telehealth: Payer: Self-pay | Admitting: Internal Medicine

## 2012-03-06 MED ORDER — TESTOSTERONE 20.25 MG/ACT (1.62%) TD GEL: 2.0000 | Freq: Every day | TRANSDERMAL | Status: DC

## 2012-03-06 NOTE — Telephone Encounter (Signed)
Renew the medicine and set him up for an appointment. His last blood draw was July of last year

## 2012-03-06 NOTE — Telephone Encounter (Signed)
Pt has appt

## 2012-03-06 NOTE — Telephone Encounter (Signed)
Called med in per jcl 

## 2012-03-12 NOTE — Procedures (Unsigned)
DUPLEX DEEP VENOUS EXAM - LOWER EXTREMITY  INDICATION:  One week status post right GSV EVLT.  HISTORY:  Edema:  Yes Trauma/Surgery:  EVLT Pain:  Yes PE:  No Previous DVT:  No Anticoagulants:  No Other:  DUPLEX EXAM:               CFV   SFV   PopV  PTV    GSV               R  L  R  L  R  L  R   L  R  L Thrombosis    o     o     o     o      + Spontaneous   +     +     +            o Phasic        +     +     +            o Augmentation  +     +     +     +      o Compressible  +     +     +     +      o Competent  Legend:  + - yes  o - no  p - partial  D - decreased  IMPRESSION: 1. Successful ablation of the right greater saphenous vein without     deep venous involvement. 2. Reflux of the right posterior tibial veins and peroneal veins is     observed.   _____________________________ Quita Skye. Hart Rochester, M.D.  LT/MEDQ  D:  03/05/2012  T:  03/05/2012  Job:  [id number]

## 2012-03-26 ENCOUNTER — Ambulatory Visit (INDEPENDENT_AMBULATORY_CARE_PROVIDER_SITE_OTHER): Payer: BC Managed Care – PPO | Admitting: Family Medicine

## 2012-03-26 ENCOUNTER — Encounter: Payer: Self-pay | Admitting: Family Medicine

## 2012-03-26 VITALS — BP 112/80 | HR 69 | Wt 266.0 lb

## 2012-03-26 DIAGNOSIS — I10 Essential (primary) hypertension: Secondary | ICD-10-CM

## 2012-03-26 DIAGNOSIS — E291 Testicular hypofunction: Secondary | ICD-10-CM

## 2012-03-26 DIAGNOSIS — Z79899 Other long term (current) drug therapy: Secondary | ICD-10-CM

## 2012-03-26 DIAGNOSIS — G4733 Obstructive sleep apnea (adult) (pediatric): Secondary | ICD-10-CM

## 2012-03-26 DIAGNOSIS — E669 Obesity, unspecified: Secondary | ICD-10-CM

## 2012-03-26 LAB — CBC WITH DIFFERENTIAL/PLATELET
Basophils Absolute: 0 10*3/uL (ref 0.0–0.1)
Basophils Relative: 0 % (ref 0–1)
Lymphocytes Relative: 43 % (ref 12–46)
MCHC: 31.8 g/dL (ref 30.0–36.0)
Monocytes Absolute: 0.5 10*3/uL (ref 0.1–1.0)
Neutro Abs: 2.8 10*3/uL (ref 1.7–7.7)
Neutrophils Relative %: 44 % (ref 43–77)
Platelets: 228 10*3/uL (ref 150–400)
RDW: 12.9 % (ref 11.5–15.5)
WBC: 6.3 10*3/uL (ref 4.0–10.5)

## 2012-03-26 LAB — COMPREHENSIVE METABOLIC PANEL
ALT: 16 U/L (ref 0–53)
AST: 24 U/L (ref 0–37)
Albumin: 4.2 g/dL (ref 3.5–5.2)
Alkaline Phosphatase: 51 U/L (ref 39–117)
BUN: 14 mg/dL (ref 6–23)
Chloride: 106 mEq/L (ref 96–112)
Potassium: 4.6 mEq/L (ref 3.5–5.3)
Sodium: 143 mEq/L (ref 135–145)

## 2012-03-26 MED ORDER — SILDENAFIL CITRATE 50 MG PO TABS: 50.0000 mg | ORAL_TABLET | Freq: Every day | ORAL | Status: DC | PRN

## 2012-03-26 MED ORDER — TESTOSTERONE 20.25 MG/ACT (1.62%) TD GEL: 2.0000 | Freq: Every day | TRANSDERMAL | Status: DC

## 2012-03-26 NOTE — Patient Instructions (Signed)
-  I will call you with the results

## 2012-03-26 NOTE — Progress Notes (Signed)
  Subjective:    Patient ID: [name], male    DOB: 1956/04/25, 56 y.o.   MRN: [id number]  HPI He is here for an interval evaluation. He does need his testosterone renewed. He has noted some energy and stamina improvement. He continues to lose weight and have a LAP-BAND procedure. He has noted difficulty with getting and maintaining erections. This has been bothersome since he was placed on his blood pressure meds. He does have underlying sleep apnea however is not using the mask due to inability to find one that works correctly.   Review of Systems     Objective:   Physical Exam alert and in no distress. Tympanic membranes and canals are normal. Throat is clear. Tonsils are normal. Neck is supple without adenopathy or thyromegaly. Cardiac exam shows a regular sinus rhythm without murmurs or gallops. Lungs are clear to auscultation.        Assessment & Plan:   1. HYPERTENSION  CBC with Differential, Comprehensive metabolic panel  2. EXOGENOUS OBESITY  Comprehensive metabolic panel  3. OBSTRUCTIVE SLEEP APNEA    4. Hypogonadism male  Testosterone 20.25 MG/ACT (1.62%) GEL, CBC with Differential  5. Encounter for long-term (current) use of other medications  Testosterone 20.25 MG/ACT (1.62%) GEL, CBC with Differential, Comprehensive metabolic panel   encouraged him to continue with his weight reduction. Also encouraged increased physical activity at least 5 days a week for one half hour. A sample of Viagra given with instructions on proper use and possible side effects.

## 2012-03-27 ENCOUNTER — Other Ambulatory Visit: Payer: Self-pay

## 2012-03-27 NOTE — Progress Notes (Signed)
Quick Note:  The blood work is normal ______ 

## 2012-03-30 ENCOUNTER — Ambulatory Visit (AMBULATORY_SURGERY_CENTER): Payer: BC Managed Care – PPO | Admitting: *Deleted

## 2012-03-30 ENCOUNTER — Encounter: Payer: Self-pay | Admitting: Gastroenterology

## 2012-03-30 VITALS — Ht 73.5 in | Wt 269.1 lb

## 2012-03-30 DIAGNOSIS — Z1211 Encounter for screening for malignant neoplasm of colon: Secondary | ICD-10-CM

## 2012-03-30 MED ORDER — PEG-KCL-NACL-NASULF-NA ASC-C 100 G PO SOLR: ORAL | Status: DC

## 2012-04-17 ENCOUNTER — Encounter: Payer: Self-pay | Admitting: Gastroenterology

## 2012-04-17 ENCOUNTER — Ambulatory Visit (AMBULATORY_SURGERY_CENTER): Payer: BC Managed Care – PPO | Admitting: Gastroenterology

## 2012-04-17 VITALS — BP 126/62 | HR 63 | Temp 95.5°F | Resp 14 | Ht 73.5 in | Wt 269.0 lb

## 2012-04-17 DIAGNOSIS — Z86010 Personal history of colonic polyps: Secondary | ICD-10-CM

## 2012-04-17 DIAGNOSIS — K5730 Diverticulosis of large intestine without perforation or abscess without bleeding: Secondary | ICD-10-CM

## 2012-04-17 DIAGNOSIS — Z1211 Encounter for screening for malignant neoplasm of colon: Secondary | ICD-10-CM

## 2012-04-17 MED ORDER — SODIUM CHLORIDE 0.9 % IV SOLN: 500.0000 mL | INTRAVENOUS | Status: DC

## 2012-04-17 NOTE — Progress Notes (Signed)
No complaints noted in the recovery room. Maw  Patient did not experience any of the following events: a burn prior to discharge; a fall within the facility; wrong site/side/patient/procedure/implant event; or a hospital transfer or hospital admission upon discharge from the facility. (G8907) Patient did not have preoperative order for IV antibiotic SSI prophylaxis. (G8918)  

## 2012-04-17 NOTE — Patient Instructions (Signed)
Handout given on diverticulosis and high fiber diet to your care partner.  Resume your prior medications today.  Please call if any questions or concerns.    YOU HAD AN ENDOSCOPIC PROCEDURE TODAY AT THE [facility]: Refer to the procedure report that was given to you for any specific questions about what was found during the examination.  If the procedure report does not answer your questions, please call your gastroenterologist to clarify.  If you requested that your care partner not be given the details of your procedure findings, then the procedure report has been included in a sealed envelope for you to review at your convenience later.  YOU SHOULD EXPECT: Some feelings of bloating in the abdomen. Passage of more gas than usual.  Walking can help get rid of the air that was put into your GI tract during the procedure and reduce the bloating. If you had a lower endoscopy (such as a colonoscopy or flexible sigmoidoscopy) you may notice spotting of blood in your stool or on the toilet paper. If you underwent a bowel prep for your procedure, then you may not have a normal bowel movement for a few days.  DIET: Your first meal following the procedure should be a light meal and then it is ok to progress to your normal diet.  A half-sandwich or bowl of soup is an example of a good first meal.  Heavy or fried foods are harder to digest and may make you feel nauseous or bloated.  Likewise meals heavy in dairy and vegetables can cause extra gas to form and this can also increase the bloating.  Drink plenty of fluids but you should avoid alcoholic beverages for 24 hours.  ACTIVITY: Your care partner should take you home directly after the procedure.  You should plan to take it easy, moving slowly for the rest of the day.  You can resume normal activity the day after the procedure however you should NOT DRIVE or use heavy machinery for 24 hours (because of the sedation medicines used during the test).      SYMPTOMS TO REPORT IMMEDIATELY: A gastroenterologist can be reached at any hour.  During normal business hours, 8:30 AM to 5:00 PM Monday through Friday, call [phone].  After hours and on weekends, please call the GI answering service at [phone] who will take a message and have the physician on call contact you.   Following lower endoscopy (colonoscopy or flexible sigmoidoscopy):  Excessive amounts of blood in the stool  Significant tenderness or worsening of abdominal pains  Swelling of the abdomen that is new, acute  Fever of 100F or higher    FOLLOW UP: If any biopsies were taken you will be contacted by phone or by letter within the next 1-3 weeks.  Call your gastroenterologist if you have not heard about the biopsies in 3 weeks.  Our staff will call the home number listed on your records the next business day following your procedure to check on you and address any questions or concerns that you may have at that time regarding the information given to you following your procedure. This is a courtesy call and so if there is no answer at the home number and we have not heard from you through the emergency physician on call, we will assume that you have returned to your regular daily activities without incident.  SIGNATURES/CONFIDENTIALITY: You and/or your care partner have signed paperwork which will be entered into your electronic medical  record.  These signatures attest to the fact that that the information above on your After Visit Summary has been reviewed and is understood.  Full responsibility of the confidentiality of this discharge information lies with you and/or your care-partner.

## 2012-04-17 NOTE — Op Note (Signed)
[facility] [address] [place], [place]  [address]  COLONOSCOPY PROCEDURE REPORT  PATIENT:  [name], [name]  MR#:  [id number] BIRTHDATE:  1956-04-10, 55 yrs. old  GENDER:  male ENDOSCOPIST:  Vania Rea. Jarold Motto, MD, [facility] REF. BY: PROCEDURE DATE:  04/17/2012 PROCEDURE:  Colonoscopy 19147 ASA CLASS:  Class II INDICATIONS:  history of pre-cancerous (adenomatous) colon polyps  MEDICATIONS:   propofol (Diprivan) 150 mg IV  DESCRIPTION OF PROCEDURE:   After the risks and benefits and of the procedure were explained, informed consent was obtained. Digital rectal exam was performed and revealed no abnormalities. The LB [id number] endoscope was introduced through the anus and advanced to the cecum, which was identified by both the appendix and ileocecal valve.  The quality of the prep was excellent, using MoviPrep.  The instrument was then slowly withdrawn as the colon was fully examined. <<PROCEDUREIMAGES>>  FINDINGS:  Moderate diverticulosis was found in the sigmoid to descending colon segments.  No polyps or cancers were seen.  This was otherwise a normal examination of the colon.   Retroflexed views in the rectum revealed no abnormalities.    The scope was then withdrawn from the patient and the procedure completed.  COMPLICATIONS:  None ENDOSCOPIC IMPRESSION: 1) Moderate diverticulosis in the sigmoid to descending colon segments 2) No polyps or cancers 3) Otherwise normal examination RECOMMENDATIONS: 1) Repeat Colonscopy in 10 years. 2) High fiber diet  REPEAT EXAM:  No  ______________________________ Vania Rea. Jarold Motto, MD, [facility]  CC:  n. eSIGNED:   Vania Rea. Elgar Scoggins at 04/17/2012 10:32 AM  Lillia Pauls, [phone]

## 2012-04-17 NOTE — Progress Notes (Signed)
PROPOFOL PER SCAMP CRNA. SEE SCANNED INTRA PROCEDURE REPORT. MEDS ALL TITRATED PER CRNA AND MD. Brandy Hale

## 2012-04-18 ENCOUNTER — Telehealth: Payer: Self-pay | Admitting: *Deleted

## 2012-04-18 NOTE — Telephone Encounter (Signed)
  Follow up Call-  Call back number 04/17/2012  Post procedure Call Back phone  # [phone]  Permission to leave phone message Yes     Patient questions:  Do you have a fever, pain , or abdominal swelling? no Pain Score  0 *  Have you tolerated food without any problems? yes  Have you been able to return to your normal activities? yes  Do you have any questions about your discharge instructions: Diet   no Medications  no Follow up visit  no  Do you have questions or concerns about your Care? no  Actions: * If pain score is 4 or above: No action needed, pain <4.

## 2012-04-30 ENCOUNTER — Telehealth: Payer: Self-pay | Admitting: Internal Medicine

## 2012-04-30 DIAGNOSIS — Z79899 Other long term (current) drug therapy: Secondary | ICD-10-CM

## 2012-04-30 DIAGNOSIS — E291 Testicular hypofunction: Secondary | ICD-10-CM

## 2012-04-30 MED ORDER — TESTOSTERONE 20.25 MG/ACT (1.62%) TD GEL: 2.0000 | Freq: Every day | TRANSDERMAL | Status: DC

## 2012-04-30 NOTE — Telephone Encounter (Signed)
Renew his medication 

## 2012-04-30 NOTE — Telephone Encounter (Signed)
Called med in per jcl 

## 2012-04-30 NOTE — Telephone Encounter (Signed)
Med called in per jcl 

## 2012-07-12 ENCOUNTER — Telehealth: Payer: Self-pay | Admitting: Internal Medicine

## 2012-07-13 ENCOUNTER — Other Ambulatory Visit: Payer: Self-pay

## 2012-07-13 DIAGNOSIS — Z79899 Other long term (current) drug therapy: Secondary | ICD-10-CM

## 2012-07-13 DIAGNOSIS — E291 Testicular hypofunction: Secondary | ICD-10-CM

## 2012-07-13 MED ORDER — TESTOSTERONE 20.25 MG/ACT (1.62%) TD GEL: 2.0000 | Freq: Every day | TRANSDERMAL | Status: DC

## 2012-07-13 NOTE — Telephone Encounter (Signed)
This has been called in

## 2012-07-13 NOTE — Telephone Encounter (Signed)
Called med in per jcl 

## 2012-07-13 NOTE — Telephone Encounter (Signed)
Okay to renew

## 2012-09-24 ENCOUNTER — Telehealth: Payer: Self-pay | Admitting: Gastroenterology

## 2012-09-24 NOTE — Telephone Encounter (Signed)
Pt is calling because his COLON was not listed ans screening and it cost more; pt states he did not know he had polyps before. I cannot find anything in the chart, but I informed him if his 1st COLON had been OK it would have been a 54yr f/u and not 5 yrs. I will get the chart and call him back.

## 2012-09-24 NOTE — Telephone Encounter (Signed)
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 Mailed him copies of his 2008 procedures and path.

## 2012-10-18 ENCOUNTER — Other Ambulatory Visit: Payer: Self-pay | Admitting: Family Medicine

## 2013-01-24 ENCOUNTER — Telehealth: Payer: Self-pay | Admitting: Family Medicine

## 2013-01-24 NOTE — Telephone Encounter (Signed)
He needs an appointment. Only give him a one-month supply

## 2013-01-25 ENCOUNTER — Other Ambulatory Visit: Payer: Self-pay

## 2013-01-25 DIAGNOSIS — E291 Testicular hypofunction: Secondary | ICD-10-CM

## 2013-01-25 DIAGNOSIS — Z79899 Other long term (current) drug therapy: Secondary | ICD-10-CM

## 2013-01-25 MED ORDER — TESTOSTERONE 20.25 MG/ACT (1.62%) TD GEL: 2.0000 | Freq: Every day | TRANSDERMAL | Status: DC

## 2013-01-25 NOTE — Telephone Encounter (Signed)
Med called in pt needs apt before any more refills

## 2013-01-30 ENCOUNTER — Other Ambulatory Visit: Payer: Self-pay | Admitting: Family Medicine

## 2013-01-30 DIAGNOSIS — Z79899 Other long term (current) drug therapy: Secondary | ICD-10-CM

## 2013-01-30 DIAGNOSIS — E291 Testicular hypofunction: Secondary | ICD-10-CM

## 2013-01-30 MED ORDER — TESTOSTERONE 20.25 MG/ACT (1.62%) TD GEL: 2.0000 | Freq: Every day | TRANSDERMAL | Status: DC

## 2013-01-30 NOTE — Telephone Encounter (Signed)
Called [facility] [phone] order 1 month for Androgel Pump 1.62%  No refills. Called pt scheduled CPE in March

## 2013-03-13 ENCOUNTER — Encounter: Payer: Self-pay | Admitting: Family Medicine

## 2013-03-13 ENCOUNTER — Ambulatory Visit (INDEPENDENT_AMBULATORY_CARE_PROVIDER_SITE_OTHER): Payer: BC Managed Care – PPO | Admitting: Family Medicine

## 2013-03-13 VITALS — BP 118/78 | HR 85 | Ht 73.0 in | Wt 266.0 lb

## 2013-03-13 DIAGNOSIS — Z125 Encounter for screening for malignant neoplasm of prostate: Secondary | ICD-10-CM

## 2013-03-13 DIAGNOSIS — I10 Essential (primary) hypertension: Secondary | ICD-10-CM

## 2013-03-13 DIAGNOSIS — Z8679 Personal history of other diseases of the circulatory system: Secondary | ICD-10-CM

## 2013-03-13 DIAGNOSIS — J45909 Unspecified asthma, uncomplicated: Secondary | ICD-10-CM

## 2013-03-13 DIAGNOSIS — N529 Male erectile dysfunction, unspecified: Secondary | ICD-10-CM

## 2013-03-13 DIAGNOSIS — E291 Testicular hypofunction: Secondary | ICD-10-CM

## 2013-03-13 DIAGNOSIS — J309 Allergic rhinitis, unspecified: Secondary | ICD-10-CM

## 2013-03-13 DIAGNOSIS — G4733 Obstructive sleep apnea (adult) (pediatric): Secondary | ICD-10-CM

## 2013-03-13 DIAGNOSIS — Z79899 Other long term (current) drug therapy: Secondary | ICD-10-CM

## 2013-03-13 DIAGNOSIS — E669 Obesity, unspecified: Secondary | ICD-10-CM

## 2013-03-13 DIAGNOSIS — Z0000 Encounter for general adult medical examination without abnormal findings: Secondary | ICD-10-CM

## 2013-03-13 DIAGNOSIS — M722 Plantar fascial fibromatosis: Secondary | ICD-10-CM

## 2013-03-13 LAB — COMPREHENSIVE METABOLIC PANEL
ALT: 14 U/L (ref 0–53)
AST: 21 U/L (ref 0–37)
Albumin: 4.2 g/dL (ref 3.5–5.2)
Alkaline Phosphatase: 56 U/L (ref 39–117)
BUN: 16 mg/dL (ref 6–23)
Calcium: 9.2 mg/dL (ref 8.4–10.5)
Chloride: 100 mEq/L (ref 96–112)
Potassium: 4.4 mEq/L (ref 3.5–5.3)
Sodium: 136 mEq/L (ref 135–145)
Total Protein: 7.3 g/dL (ref 6.0–8.3)

## 2013-03-13 LAB — POCT URINALYSIS DIPSTICK
Bilirubin, UA: NEGATIVE
Glucose, UA: NEGATIVE
Ketones, UA: NEGATIVE
Leukocytes, UA: NEGATIVE
Nitrite, UA: NEGATIVE
pH, UA: 5

## 2013-03-13 LAB — LIPID PANEL
Total CHOL/HDL Ratio: 4.4 Ratio
VLDL: 28 mg/dL (ref 0–40)

## 2013-03-13 LAB — TESTOSTERONE: Testosterone: 169 ng/dL — ABNORMAL LOW (ref 300–890)

## 2013-03-13 LAB — CBC WITH DIFFERENTIAL/PLATELET
Eosinophils Absolute: 0.1 10*3/uL (ref 0.0–0.7)
Eosinophils Relative: 1 % (ref 0–5)
Hemoglobin: 15.2 g/dL (ref 13.0–17.0)
Lymphs Abs: 1.5 10*3/uL (ref 0.7–4.0)
MCH: 28.6 pg (ref 26.0–34.0)
MCV: 85.1 fL (ref 78.0–100.0)
Monocytes Absolute: 0.4 10*3/uL (ref 0.1–1.0)
Monocytes Relative: 5 % (ref 3–12)
RBC: 5.31 MIL/uL (ref 4.22–5.81)

## 2013-03-13 MED ORDER — VARDENAFIL HCL 20 MG PO TABS: 20.0000 mg | ORAL_TABLET | Freq: Every day | ORAL | Status: DC | PRN

## 2013-03-13 MED ORDER — LISINOPRIL-HYDROCHLOROTHIAZIDE 10-12.5 MG PO TABS: ORAL_TABLET | ORAL | Status: DC

## 2013-03-13 MED ORDER — ALBUTEROL SULFATE HFA 108 (90 BASE) MCG/ACT IN AERS: 2.0000 | INHALATION_SPRAY | Freq: Four times a day (QID) | RESPIRATORY_TRACT | Status: DC | PRN

## 2013-03-13 MED ORDER — TESTOSTERONE 20.25 MG/ACT (1.62%) TD GEL: 2.0000 | Freq: Every day | TRANSDERMAL | Status: DC

## 2013-03-13 NOTE — Patient Instructions (Addendum)
Get back on the right path with your eating habits.Plantar Fasciitis Plantar fasciitis is a common condition that causes foot pain. It is soreness (inflammation) of the band of tough fibrous tissue on the bottom of the foot that runs from the heel bone (calcaneus) to the ball of the foot. The cause of this soreness may be from excessive standing, poor fitting shoes, running on hard surfaces, being overweight, having an abnormal walk, or overuse (this is common in runners) of the painful foot or feet. It is also common in aerobic exercise dancers and ballet dancers. SYMPTOMS  Most people with plantar fasciitis complain of:  Severe pain in the morning on the bottom of their foot especially when taking the first steps out of bed. This pain recedes after a few minutes of walking.  Severe pain is experienced also during walking following a long period of inactivity.  Pain is worse when walking barefoot or up stairs DIAGNOSIS   Your caregiver will diagnose this condition by examining and feeling your foot.  Special tests such as X-rays of your foot, are usually not needed. PREVENTION   Consult a sports medicine professional before beginning a new exercise program.  Walking programs offer a good workout. With walking there is a lower chance of overuse injuries common to runners. There is less impact and less jarring of the joints.  Begin all new exercise programs slowly. If problems or pain develop, decrease the amount of time or distance until you are at a comfortable level.  Wear good shoes and replace them regularly.  Stretch your foot and the heel cords at the back of the ankle (Achilles tendon) both before and after exercise.  Run or exercise on even surfaces that are not hard. For example, asphalt is better than pavement.  Do not run barefoot on hard surfaces.  If using a treadmill, vary the incline.  Do not continue to workout if you have foot or joint problems. Seek professional  help if they do not improve. HOME CARE INSTRUCTIONS   Avoid activities that cause you pain until you recover.  Use ice or cold packs on the problem or painful areas after working out.  Only take over-the-counter or prescription medicines for pain, discomfort, or fever as directed by your caregiver.  Soft shoe inserts or athletic shoes with air or gel sole cushions may be helpful.  If problems continue or become more severe, consult a sports medicine caregiver or your own health care provider. Cortisone is a potent anti-inflammatory medication that may be injected into the painful area. You can discuss this treatment with your caregiver. MAKE SURE YOU:   Understand these instructions.  Will watch your condition.  Will get help right away if you are not doing well or get worse. Document Released: 09/06/2001 Document Revised: 03/05/2012 Document Reviewed: 11/05/2008 [facility] Patient Information 2013 [place], [place]. If you need to use your inhaler regularly more than twice a week, let me know.

## 2013-03-13 NOTE — Progress Notes (Signed)
Subjective:    Patient ID: [name], male    DOB: 08/29/1956, 57 y.o.   MRN: [id number]  HPI He is here for complete examination. He does have a history of lap band surgery. His weight has been stable for the last couple of years. He has not seen the surgeon in that timeframe. He does have a history of obstructive sleep apnea but has not used his CPAP due to constantly feeling dryness in spite of using a humidifier. He is no longer using this. He has a remote history of atrial fibrillation over a decade ago but no trouble since then. He also stopped using his testosterone stating he didn't think it was doing any good and causing him approximately $60 per month. He does have underlying allergies and is on medications for this. He is also had recent difficulty with asthma. Normally he does not use an inhaler on a regular basis. He also has underlying ED. He did try Viagra but it caused a headache and he therefore has not used it since then. He does complain of intermittent difficulty with left toe pain. It usually bothers him when he walks barefoot.    Review of Systems Negative except as above    Objective:   Physical Exam BP 118/78  Pulse 85  Ht 6\' 1"  (1.854 m)  Wt 266 lb (120.657 kg)  BMI 35.1 kg/m2  General Appearance:    Alert, cooperative, no distress, appears stated age  Head:    Normocephalic, without obvious abnormality, atraumatic  Eyes:    PERRL, conjunctiva/corneas clear, EOM's intact, fundi    benign  Ears:    Normal TM's and external ear canals  Nose:   Nares normal, mucosa normal, no drainage or sinus   tenderness  Throat:   Lips, mucosa, and tongue normal; teeth and gums normal  Neck:   Supple, no lymphadenopathy;  thyroid:  no   enlargement/tenderness/nodules; no carotid   bruit or JVD  Back:    Spine nontender, no curvature, ROM normal, no CVA     tenderness  Lungs:     Clear to auscultation bilaterally without wheezes, rales or     ronchi; respirations unlabored   Chest Wall:    No tenderness or deformity   Heart:    Regular rate and rhythm, S1 and S2 normal, no murmur, rub   or gallop  Breast Exam:    No chest wall tenderness, masses or gynecomastia  Abdomen:     Soft, non-tender, nondistended, normoactive bowel sounds,    no masses, no hepatosplenomegaly  Genitalia:  deferred  Rectal:  deferred  Extremities:   No clubbing, cyanosis or edema  Pulses:   2+ and symmetric all extremities  Skin:   Skin color, texture, turgor normal, no rashes or lesions  Lymph nodes:   Cervical, supraclavicular, and axillary nodes normal  Neurologic:   CNII-XII intact, normal strength, sensation and gait; reflexes 2+ and symmetric throughout          Psych:   Normal mood, affect, hygiene and grooming.           Assessment & Plan:  Routine general medical examination at a health care facility - Plan: Comprehensive metabolic panel, CBC with Differential, Lipid panel  HYPERTENSION - Plan: POCT urinalysis dipstick, lisinopril-hydrochlorothiazide (PRINZIDE,ZESTORETIC) 10-12.5 MG per tablet  Plantar fasciitis of left foot  EXOGENOUS OBESITY - Plan: Ambulatory referral to General Surgery  OBSTRUCTIVE SLEEP APNEA  ALLERGIC RHINITIS  Hypogonadism male - Plan: Testosterone, Testosterone 20.25  MG/ACT (1.62%) GEL  ATRIAL FIBRILLATION, HX OF  Intrinsic asthma - Plan: albuterol (PROVENTIL HFA;VENTOLIN HFA) 108 (90 BASE) MCG/ACT inhaler  Special screening for malignant neoplasm of prostate - Plan: PSA  Encounter for long-term (current) use of other medications  ED (erectile dysfunction) - Plan: vardenafil (LEVITRA) 20 MG tablet all the above issues were discussed in detail. Discussed the fact that continued weight loss this would help with his overall health including the plantar fasciitis, sleep apnea, hypogonadism. Recommend he return to see the surgeons to have this taken care of. Discussed treatment of asthma with the Proventil versus using a steroid inhaler.  Sample of Levitra given with instructions on use and possible side effects. Continue on all present medications. I will followup on the hypogonadism based on the testosterone. He voiced understanding of all this.

## 2013-03-13 NOTE — Progress Notes (Signed)
[facility] [phone] PT HAS APT WITH ANDY THE PA TO GET RESTABLISHED SO HE CAN LAP BAND REEVALUATED MARCH 27 AT 1:30 PT INFORMED

## 2013-03-14 ENCOUNTER — Other Ambulatory Visit: Payer: Self-pay

## 2013-03-14 DIAGNOSIS — E291 Testicular hypofunction: Secondary | ICD-10-CM

## 2013-03-14 LAB — PSA: PSA: 0.49 ng/mL (ref <=4.00)

## 2013-03-14 MED ORDER — TESTOSTERONE 20.25 MG/ACT (1.62%) TD GEL: 4.0000 | Freq: Every day | TRANSDERMAL | Status: DC

## 2013-03-14 NOTE — Progress Notes (Signed)
Quick Note:  CALLED PT TO INFORM HIM OF LABS AND TO USE 4 PUMPS PER DAY PT VERBALIZED UNDERSTANDING ______

## 2013-03-14 NOTE — Telephone Encounter (Signed)
IN NEW DOSE OF TESTOSTERONE

## 2013-03-14 NOTE — Progress Notes (Signed)
Quick Note:  Labs are okay except for testosterone. Have him double the dosing to 4 bottles per day and recheck here in one month. ______

## 2013-03-21 ENCOUNTER — Ambulatory Visit (INDEPENDENT_AMBULATORY_CARE_PROVIDER_SITE_OTHER): Payer: BC Managed Care – PPO | Admitting: Physician Assistant

## 2013-03-21 ENCOUNTER — Encounter (INDEPENDENT_AMBULATORY_CARE_PROVIDER_SITE_OTHER): Payer: Self-pay

## 2013-03-21 VITALS — BP 122/78 | HR 68 | Temp 99.4°F | Resp 18 | Ht 73.5 in | Wt 269.4 lb

## 2013-03-21 DIAGNOSIS — Z4651 Encounter for fitting and adjustment of gastric lap band: Secondary | ICD-10-CM

## 2013-03-21 NOTE — Progress Notes (Signed)
  HISTORY: [name] is a 57 y.o.male who received an AP-Large lap-band in September 2008 by Dr. Colin Benton. He comes in with 5 lbs weight loss since his last visit in November 2011. He wants to lose an additional 20 lbs. He denies regurgitation or reflux symptoms. He says his hunger is under good control but is eating more than expected.  VITAL SIGNS: Filed Vitals:   03/21/13 1346  BP: 122/78  Pulse: 68  Temp: 99.4 F (37.4 C)  Resp: 18    PHYSICAL EXAM: Physical exam reveals a very well-appearing 56 y.o.male in no apparent distress Neurologic: Awake, alert, oriented Psych: Bright affect, conversant Respiratory: Breathing even and unlabored. No stridor or wheezing Abdomen: Soft, nontender, nondistended to palpation. Incisions well-healed. No incisional hernias. Port easily palpated. Extremities: Atraumatic, good range of motion.  ASSESMENT: 57 y.o.  male  s/p AP-Large  lap-band.   PLAN: The patient's port was accessed with a 20G Huber needle without difficulty. Clear fluid was aspirated and 0.5 mL saline was added to the port to give a total predicted volume of 11.25 mL. The patient was able to swallow water without difficulty following the procedure and was instructed to take clear liquids for the next 24-48 hours and advance slowly as tolerated.

## 2013-03-21 NOTE — Patient Instructions (Signed)
Take clear liquids tonight. Thin protein shakes are ok to start tomorrow morning. Slowly advance your diet thereafter. Call us if you have persistent vomiting or regurgitation, night cough or reflux symptoms. Return as scheduled or sooner if you notice no changes in hunger/portion sizes.  

## 2013-04-04 ENCOUNTER — Telehealth: Payer: Self-pay | Admitting: Internal Medicine

## 2013-04-04 ENCOUNTER — Other Ambulatory Visit: Payer: Self-pay

## 2013-04-04 DIAGNOSIS — R7989 Other specified abnormal findings of blood chemistry: Secondary | ICD-10-CM

## 2013-04-04 NOTE — Telephone Encounter (Signed)
Pt cant come any other time he will be back out of town and we are not here Thursday the 17 so I have put order in for test.check

## 2013-04-04 NOTE — Telephone Encounter (Signed)
Make sure he is scheduled as a quick office visit so I can question him on testosterone

## 2013-04-04 NOTE — Telephone Encounter (Signed)
Pt is coming in next Thursday 04/08/13 for a recheck on his testosterone, will you put in future order in the system for that please.

## 2013-04-11 ENCOUNTER — Other Ambulatory Visit: Payer: BC Managed Care – PPO

## 2013-04-11 DIAGNOSIS — R7989 Other specified abnormal findings of blood chemistry: Secondary | ICD-10-CM

## 2013-04-11 LAB — TESTOSTERONE: Testosterone: 731 ng/dL (ref 300–890)

## 2013-04-15 NOTE — Progress Notes (Signed)
Quick Note:  PATIENT INFORMED AND VERBALIZED UNDERSTANDING ______ 

## 2013-06-20 ENCOUNTER — Encounter (INDEPENDENT_AMBULATORY_CARE_PROVIDER_SITE_OTHER): Payer: BC Managed Care – PPO

## 2013-07-17 ENCOUNTER — Encounter: Payer: Self-pay | Admitting: Family Medicine

## 2013-07-17 ENCOUNTER — Ambulatory Visit (INDEPENDENT_AMBULATORY_CARE_PROVIDER_SITE_OTHER): Payer: BC Managed Care – PPO | Admitting: Family Medicine

## 2013-07-17 VITALS — BP 118/80 | HR 63 | Wt 255.0 lb

## 2013-07-17 DIAGNOSIS — R079 Chest pain, unspecified: Secondary | ICD-10-CM

## 2013-07-17 DIAGNOSIS — R0781 Pleurodynia: Secondary | ICD-10-CM

## 2013-07-17 NOTE — Progress Notes (Signed)
  Subjective:    Patient ID: [name], male    DOB: [birth year], 57 y.o.   MRN: [id number]  HPI He has a one-month history of left lower rib pain that gets worse with certain movements and with palpation. No chest pain, shortness of breath, fatigue, fever, chills or abdominal pain. He does have LAP-BAND which was readjusted within the last several months and he continues to lose weight. In fact he has lost approximately 10 pounds since his last visit.  Review of Systems     Objective:   Physical Exam Alert and in no distress. The left lower rib cage is slightly more prominent than the right but no other lesions are palpable. No splenomegaly noted.      Assessment & Plan:  Rib pain  I reassured him that nothing was found and in fact he could be effected he is now actually able to feel his ribs

## 2013-09-06 ENCOUNTER — Telehealth: Payer: Self-pay | Admitting: Family Medicine

## 2013-09-10 ENCOUNTER — Telehealth: Payer: Self-pay | Admitting: Family Medicine

## 2013-09-18 ENCOUNTER — Telehealth: Payer: Self-pay | Admitting: Family Medicine

## 2013-09-18 NOTE — Telephone Encounter (Signed)
Call in the Testim and have him recheck in one month

## 2013-09-18 NOTE — Telephone Encounter (Signed)
Do you want to switch to Testim?

## 2013-09-19 ENCOUNTER — Other Ambulatory Visit: Payer: Self-pay

## 2013-09-19 MED ORDER — TESTOSTERONE 50 MG/5GM (1%) TD GEL: 5.0000 g | Freq: Every day | TRANSDERMAL | Status: DC

## 2013-09-19 NOTE — Telephone Encounter (Signed)
CALLED IN TESTIM PER JCL

## 2013-09-20 ENCOUNTER — Telehealth: Payer: Self-pay | Admitting: Family Medicine

## 2013-09-20 NOTE — Telephone Encounter (Signed)
lm

## 2013-09-23 NOTE — Telephone Encounter (Signed)
LM

## 2013-09-24 NOTE — Telephone Encounter (Signed)
tsd  °

## 2013-09-27 ENCOUNTER — Encounter: Payer: Self-pay | Admitting: Family Medicine

## 2013-10-21 ENCOUNTER — Encounter: Payer: Self-pay | Admitting: Family Medicine

## 2013-10-21 ENCOUNTER — Ambulatory Visit: Payer: BC Managed Care – PPO | Admitting: Family Medicine

## 2013-10-21 ENCOUNTER — Ambulatory Visit (INDEPENDENT_AMBULATORY_CARE_PROVIDER_SITE_OTHER): Payer: 59 | Admitting: Family Medicine

## 2013-10-21 VITALS — BP 122/78 | HR 86 | Wt 241.0 lb

## 2013-10-21 DIAGNOSIS — N529 Male erectile dysfunction, unspecified: Secondary | ICD-10-CM

## 2013-10-21 DIAGNOSIS — E291 Testicular hypofunction: Secondary | ICD-10-CM

## 2013-10-21 MED ORDER — TADALAFIL 5 MG PO TABS: 5.0000 mg | ORAL_TABLET | Freq: Every day | ORAL | Status: DC | PRN

## 2013-10-21 NOTE — Progress Notes (Signed)
  Subjective:    Patient ID: [name], male    DOB: 09-30-1956, 57 y.o.   MRN: [id number]  HPI He is here for recheck on testosterone. He was switched due to insurance issues one month ago. Now he is on Testim. He was stable on his previous dosing of AndroGel. Does have difficulty with erectile dysfunction and has tried Viagra and Cialis in the past. He would like to take something on a regular basis to take the guess work out of it  Review of Systems     Objective:   Physical Exam [name] and in no distress otherwise not examined       Assessment & Plan:  Hypogonadism male - Plan: Testosterone  ED (erectile dysfunction) - Plan: tadalafil (CIALIS) 5 MG tablet  I explained that his insurance might not cover the daily use of Cialis.

## 2013-10-22 ENCOUNTER — Other Ambulatory Visit: Payer: Self-pay

## 2013-10-22 ENCOUNTER — Encounter: Payer: Self-pay | Admitting: Internal Medicine

## 2013-10-22 LAB — TESTOSTERONE: Testosterone: 543 ng/dL (ref 300–890)

## 2013-10-22 MED ORDER — TESTOSTERONE 50 MG/5GM (1%) TD GEL: 5.0000 g | Freq: Every day | TRANSDERMAL | Status: DC

## 2013-10-22 NOTE — Telephone Encounter (Signed)
CALLED TESTIM IN

## 2013-12-23 ENCOUNTER — Encounter: Payer: Self-pay | Admitting: Medical

## 2013-12-23 ENCOUNTER — Ambulatory Visit (INDEPENDENT_AMBULATORY_CARE_PROVIDER_SITE_OTHER): Payer: 59 | Admitting: Medical

## 2013-12-23 ENCOUNTER — Other Ambulatory Visit (INDEPENDENT_AMBULATORY_CARE_PROVIDER_SITE_OTHER): Payer: Self-pay

## 2013-12-23 VITALS — BP 100/80 | HR 60 | Temp 98.3°F | Resp 16 | Wt 230.0 lb

## 2013-12-23 DIAGNOSIS — Z9884 Bariatric surgery status: Secondary | ICD-10-CM

## 2013-12-23 DIAGNOSIS — R1012 Left upper quadrant pain: Secondary | ICD-10-CM

## 2013-12-23 NOTE — Progress Notes (Signed)
Subjective: Here for abdominal pain.  Years ago had lap band procedure, but in the last week or so he has been having some pains in left upper abdomen when he turns certain way, or turns his left shoulder a certain way.   The pain is sudden and can be moderate.   Last had adjustment 02/2013 with [facility].   He has been doing fine up until now.  He has continued to lose a few pounds per month intentionally with diet and exercise.  He did have recent 48 hour stomach bug that went through the household.  The symptoms have resolved. She has had some loose stool with this and upset stomach.    Past Medical History  Diagnosis Date  . Allergy     RHINITIS  . Dyslipidemia   . GERD (gastroesophageal reflux disease)   . Hypertension   . Obesity   . Asthma   . Hypogonadism male   . Atrial fibrillation   . History of colonic polyps   . Vitamin D deficiency    Past Surgical History  Procedure Laterality Date  . Laparoscopic gastric banding  2008    EARLE MD  . Endovenous ablation saphenous vein w/ laser  02/2011    right leg   Review of Systems Constitutional: -fever, -chills, -sweats, -unexpected weight change,-fatigue ENT: -runny nose, -ear pain, -sore throat Cardiology:  -chest pain, -palpitations, -edema Respiratory: -cough, -shortness of breath, -wheezing Gastroenterology:  -nausea, -vomiting, -diarrhea, -constipation Hematology: -bleeding or bruising problems Musculoskeletal: -arthralgias, -myalgias, -joint swelling, -back pain Urology: -dysuria, -difficulty urinating, -hematuria, -urinary frequency, -urgency Neurology: -headache, -weakness, -tingling, -numbness  Objective: Filed Vitals:   12/23/13 1051  BP: 100/80  Pulse: 60  Temp: 98.3 F (36.8 C)  Resp: 16    General appearance: alert, no distress, WD/WN Oral cavity: MMM, no lesions Neck: supple, no lymphadenopathy, no thyromegaly, no masses Heart: RRR, normal S1, S2, no murmurs Lungs: CTA bilaterally, no  wheezes, rhonchi, or rales Abdomen: +bs, soft, right and left upper quadrant linear surgical scars, right upper abdomen with round dense mass consistent with port for lap band, otherwise non tender, non distended, no masses, no hepatomegaly, no splenomegaly Pulses: 2+ symmetric, upper and lower extremities, normal cap refill  Assessment: Encounter Diagnoses  Name Primary?  . Abdominal pain, LUQ Yes  . History of laparoscopic adjustable gastric banding     Plan: I called and spoke to the surgery office, discuss his concerns and symptoms, and per their advice, we will set up an upper GI with KUB.  We also got him appt with Dr. Daphine Deutscher for Wednesday.  Follow-up pending study and appt with Dr. Daphine Deutscher at [facility].

## 2013-12-25 ENCOUNTER — Encounter (INDEPENDENT_AMBULATORY_CARE_PROVIDER_SITE_OTHER): Payer: Self-pay | Admitting: Surgery

## 2013-12-25 ENCOUNTER — Ambulatory Visit (INDEPENDENT_AMBULATORY_CARE_PROVIDER_SITE_OTHER): Payer: 59 | Admitting: Surgery

## 2013-12-25 ENCOUNTER — Ambulatory Visit
Admission: RE | Admit: 2013-12-25 | Discharge: 2013-12-25 | Disposition: A | Discharge status: Home / Self Care | Payer: 59 | Source: Ambulatory Visit | Attending: Medical | Admitting: Medical

## 2013-12-25 VITALS — BP 128/86 | HR 68 | Temp 97.4°F | Resp 16 | Ht 73.5 in | Wt 234.0 lb

## 2013-12-25 DIAGNOSIS — Z9884 Bariatric surgery status: Secondary | ICD-10-CM

## 2013-12-25 DIAGNOSIS — R1012 Left upper quadrant pain: Secondary | ICD-10-CM

## 2013-12-25 NOTE — Progress Notes (Signed)
Lapband Fill Encounter Problem List:   Patient Active Problem List   Diagnosis Date Noted  . Hypogonadism male 03/26/2012  . EXOGENOUS OBESITY 10/08/2007  . OBSTRUCTIVE SLEEP APNEA 10/08/2007  . HYPERTENSION 10/08/2007  . ALLERGIC RHINITIS 10/08/2007  . ATRIAL FIBRILLATION, HX OF 10/08/2007    Lillia Pauls Body mass index is 30.45 kg/(m^2). Weight loss since surgery  80 pounds  Having regurgitation?:  no  Feel that they need a fill?  no  Nocturnal reflux?  no  Amount of fill  0     Instructions given and weight loss goals discussed.    The patient has been having some intermittent pulling sharp pains in the left upper quadrant. He had a upper GI series done this morning and the report indicates that he is band is appropriately adjusted and he is very restricted. I don't think that we need to give him more fluid and he doesn't want any removed. He is happy with his weight loss. He feels like he is weight loss be except for this Corky pain.  I think it is likely that this pain may be related to this band tubing where it was stuck to the common wall and pulling and I think it is giving him that sensation. His lungs he can tolerate this so that we should just follow him. Offered him a followup appointment with you prefer to be seen as needed. Plan return as needed.  Matt B. Daphine Deutscher, MD, FACS

## 2013-12-25 NOTE — Patient Instructions (Signed)
Laparoscopic Gastric Band Surgery Care After These instructions give you information on caring for yourself after your procedure. Your doctor may also give you more specific instructions. Call your doctor if you have any problems or questions after your procedure. HOME CARE   Take walks throughout the day. Do not sit for longer than 1 hour while awake for 4 to 6 weeks.  You may shower 2 days after surgery. Pat the surgery cuts (incisions) dry. Do not rub the surgery cuts.  Do your coughing and deep breathing exercises.  Do not lift, push, or pull anything heavy until your doctor says it is okay.  Only take medicines as told by your doctor. Do not drive while taking pain medicine.  Drink plenty of fluids to keep your pee (urine) clear or pale yellow.  Stay on a liquid diet as long as your doctor tells you to.  Do not drink caffeine for 1 month.  Change bandages (dressings) as told by your doctor.  Check your surgery cuts for redness, pufffiness (swelling), abnormal coloring, fluid, or bleeding.  Follow your doctor's advice about vitamin and protein needs after surgery. GET HELP RIGHT AWAY IF:  You feel sick to your stomach (nauseous) and throw up (vomit).  You have pain and discomfort with swallowing.  You develop shortness of breath or difficulty breathing.  You have pain, puffiness, or feel warmth on your lower body.  You have very bad calf pain or pain not relieved by medicine.  You have a temperature by mouth above 102 F (38.9 C).  Your surgery cuts look red, puffy, or they leak fluid.  Your poop (stool) is black, tarry, or dark red.  You have chills.  You have chest pain.  You feel confused.  You have slurred speech.  You feel lightheaded when standing.  You suddenly feel weak.  You have any questions or concerns. MAKE SURE YOU:   Understand these instructions.  Will watch your condition.  Will get help right away if you are not doing well or get  worse. Document Released: 01/14/2011 Document Revised: 04/08/2013 Document Reviewed: 01/14/2011 [employer] Patient Information 2014 [employer].  

## 2014-02-22 ENCOUNTER — Other Ambulatory Visit: Payer: Self-pay | Admitting: Family Medicine

## 2014-02-24 NOTE — Telephone Encounter (Signed)
Medication sent in. 

## 2014-02-24 NOTE — Telephone Encounter (Signed)
Forwarding to you :)

## 2014-02-27 ENCOUNTER — Other Ambulatory Visit: Payer: Self-pay | Admitting: Family Medicine

## 2014-02-28 NOTE — Telephone Encounter (Signed)
Forwarding to you :)

## 2014-02-28 NOTE — Telephone Encounter (Signed)
Is this ok to refill?  

## 2014-03-01 NOTE — Telephone Encounter (Signed)
Okay to renew for 2 more months and then schedule for a followup visit

## 2014-03-03 NOTE — Telephone Encounter (Signed)
Pt states he will CB to schedule f/u appt.

## 2014-03-03 NOTE — Telephone Encounter (Signed)
Left message on pt VM to CB and schedule med check appt.

## 2014-03-04 MED ORDER — TESTOSTERONE 50 MG/5GM (1%) TD GEL: TRANSDERMAL | Status: DC

## 2014-04-07 ENCOUNTER — Encounter: Payer: Self-pay | Admitting: Family Medicine

## 2014-04-07 ENCOUNTER — Ambulatory Visit (INDEPENDENT_AMBULATORY_CARE_PROVIDER_SITE_OTHER): Payer: 59 | Admitting: Family Medicine

## 2014-04-07 VITALS — BP 108/72 | HR 62 | Wt 237.0 lb

## 2014-04-07 DIAGNOSIS — Z79899 Other long term (current) drug therapy: Secondary | ICD-10-CM

## 2014-04-07 DIAGNOSIS — I10 Essential (primary) hypertension: Secondary | ICD-10-CM

## 2014-04-07 DIAGNOSIS — E669 Obesity, unspecified: Secondary | ICD-10-CM

## 2014-04-07 DIAGNOSIS — E291 Testicular hypofunction: Secondary | ICD-10-CM

## 2014-04-07 LAB — CBC WITH DIFFERENTIAL/PLATELET
Basophils Absolute: 0 10*3/uL (ref 0.0–0.1)
Basophils Relative: 0 % (ref 0–1)
EOS PCT: 2 % (ref 0–5)
Eosinophils Absolute: 0.1 10*3/uL (ref 0.0–0.7)
HCT: 42.2 % (ref 39.0–52.0)
Hemoglobin: 13.8 g/dL (ref 13.0–17.0)
LYMPHS ABS: 2.3 10*3/uL (ref 0.7–4.0)
LYMPHS PCT: 40 % (ref 12–46)
MCH: 26.5 pg (ref 26.0–34.0)
MCHC: 32.7 g/dL (ref 30.0–36.0)
MCV: 81.2 fL (ref 78.0–100.0)
MONOS PCT: 8 % (ref 3–12)
Monocytes Absolute: 0.5 10*3/uL (ref 0.1–1.0)
Neutro Abs: 2.9 10*3/uL (ref 1.7–7.7)
Neutrophils Relative %: 50 % (ref 43–77)
PLATELETS: 243 10*3/uL (ref 150–400)
RBC: 5.2 MIL/uL (ref 4.22–5.81)
RDW: 15.3 % (ref 11.5–15.5)
WBC: 5.8 10*3/uL (ref 4.0–10.5)

## 2014-04-07 LAB — COMPREHENSIVE METABOLIC PANEL
ALT: 9 U/L (ref 0–53)
AST: 18 U/L (ref 0–37)
Albumin: 3.8 g/dL (ref 3.5–5.2)
Alkaline Phosphatase: 52 U/L (ref 39–117)
BILIRUBIN TOTAL: 0.4 mg/dL (ref 0.2–1.2)
BUN: 13 mg/dL (ref 6–23)
CALCIUM: 9.2 mg/dL (ref 8.4–10.5)
CHLORIDE: 101 meq/L (ref 96–112)
CO2: 27 mEq/L (ref 19–32)
CREATININE: 1.08 mg/dL (ref 0.50–1.35)
GLUCOSE: 78 mg/dL (ref 70–99)
Potassium: 4.5 mEq/L (ref 3.5–5.3)
SODIUM: 137 meq/L (ref 135–145)
TOTAL PROTEIN: 6.9 g/dL (ref 6.0–8.3)

## 2014-04-07 MED ORDER — TESTOSTERONE 50 MG/5GM (1%) TD GEL: 5.0000 g | Freq: Every day | TRANSDERMAL | Status: DC

## 2014-04-07 NOTE — Progress Notes (Signed)
   Subjective:    Patient ID: Danen Lapaglia, male    DOB: Nov 22, 1956, 58 y.o.   MRN: [id number]  HPI He is here for recheck. He continues to use Testim and notes good energy, stamina and strength. He continues on Cialis 5 mg. He has tried a higher dose on an as-needed basis but found it not as useful. He continues to be followed by general surgery for his lap band surgery. He has lost a significant amount of weight and would like to consider stopping his blood pressure medication.  Review of Systems     Objective:   Physical Exam Alert and in no distress otherwise not examined       Assessment & Plan:  EXOGENOUS OBESITY  HYPERTENSION - Plan: CBC with Differential, Comprehensive metabolic panel  Hypogonadism male - Plan: CBC with Differential, Comprehensive metabolic panel, Testosterone, PSA  Encounter for long-term (current) use of other medications - Plan: CBC with Differential, Comprehensive metabolic panel, PSA  encouraged him to continue with his weight reduction. I will have him hold his lisinopril and recheck this in roughly one month. Continue on Testim.

## 2014-04-07 NOTE — Patient Instructions (Signed)
Stop your blood pressure medication and we will check you in one month

## 2014-04-08 LAB — TESTOSTERONE: TESTOSTERONE: 201 ng/dL — AB (ref 300–890)

## 2014-04-08 LAB — PSA: PSA: 0.48 ng/mL (ref <=4.00)

## 2014-04-15 ENCOUNTER — Telehealth: Payer: Self-pay | Admitting: Family Medicine

## 2014-04-17 NOTE — Telephone Encounter (Signed)
P.A. Approved til 04/16/15, faxed pharmacy, pt informed

## 2014-04-18 ENCOUNTER — Encounter: Payer: Self-pay | Admitting: Family Medicine

## 2014-04-18 DIAGNOSIS — Z125 Encounter for screening for malignant neoplasm of prostate: Secondary | ICD-10-CM

## 2014-05-07 ENCOUNTER — Other Ambulatory Visit: Payer: 59

## 2014-05-07 ENCOUNTER — Telehealth: Payer: Self-pay | Admitting: Internal Medicine

## 2014-05-07 NOTE — Telephone Encounter (Signed)
Pt came in today for bp readings. His bp was 130/82 and then i checked it again and it was 132/90. Pt wanted to let you know what it is

## 2014-05-07 NOTE — Telephone Encounter (Signed)
Let him know this looks good.

## 2014-05-08 NOTE — Telephone Encounter (Signed)
Pt.notified

## 2014-06-02 ENCOUNTER — Other Ambulatory Visit: Payer: 59

## 2014-06-02 DIAGNOSIS — E291 Testicular hypofunction: Secondary | ICD-10-CM

## 2014-06-03 LAB — TESTOSTERONE: Testosterone: 407 ng/dL (ref 300–890)

## 2014-11-28 ENCOUNTER — Other Ambulatory Visit: Payer: Self-pay | Admitting: Family Medicine

## 2014-11-28 NOTE — Telephone Encounter (Signed)
Is this okay to refill? 

## 2014-11-29 NOTE — Telephone Encounter (Signed)
Needs an appt

## 2014-12-01 NOTE — Telephone Encounter (Signed)
Dr.Lalonde pt has appointment 12/17/14 at 1:30 he wanted to know if you would refill med please advise

## 2014-12-01 NOTE — Telephone Encounter (Signed)
Take care of this 

## 2014-12-02 ENCOUNTER — Telehealth: Payer: Self-pay | Admitting: Internal Medicine

## 2014-12-02 ENCOUNTER — Other Ambulatory Visit: Payer: Self-pay

## 2014-12-02 MED ORDER — TESTOSTERONE 50 MG/5GM (1%) TD GEL: 5.0000 g | Freq: Every day | TRANSDERMAL | Status: DC

## 2014-12-02 NOTE — Telephone Encounter (Signed)
Please take care of this and make sure he gets the correct testosterone. This is a little confusing to me right now

## 2014-12-02 NOTE — Telephone Encounter (Signed)
Per pharmacy fax-Pt uses testim 1% gel not androgel/tesosterone pump(which doesn't come in #75 but #150) . Please change back to Testim 1% gel with old directions if Dr. Is okay with it. Send to [facility]

## 2014-12-02 NOTE — Telephone Encounter (Signed)
I have corrected the problem with med when you put in testim it pulls up with generic and brand if you hit the generic it put the androgel in instead of testim

## 2014-12-09 ENCOUNTER — Telehealth: Payer: Self-pay | Admitting: Family Medicine

## 2014-12-16 NOTE — Telephone Encounter (Signed)
P.A. Cialis denied, plan exclusion.  He will call his ins company and see if there any any covered alternatives and let JCL know tomorrow at his appt.

## 2014-12-17 ENCOUNTER — Encounter: Payer: Self-pay | Admitting: Family Medicine

## 2014-12-23 ENCOUNTER — Encounter: Payer: Self-pay | Admitting: Family Medicine

## 2014-12-23 ENCOUNTER — Ambulatory Visit (INDEPENDENT_AMBULATORY_CARE_PROVIDER_SITE_OTHER): Payer: 59 | Admitting: Family Medicine

## 2014-12-23 VITALS — BP 122/70 | HR 60 | Wt 239.0 lb

## 2014-12-23 DIAGNOSIS — N529 Male erectile dysfunction, unspecified: Secondary | ICD-10-CM

## 2014-12-23 DIAGNOSIS — E669 Obesity, unspecified: Secondary | ICD-10-CM | POA: Diagnosis not present

## 2014-12-23 DIAGNOSIS — Z9884 Bariatric surgery status: Secondary | ICD-10-CM | POA: Diagnosis not present

## 2014-12-23 DIAGNOSIS — J309 Allergic rhinitis, unspecified: Secondary | ICD-10-CM

## 2014-12-23 DIAGNOSIS — Z23 Encounter for immunization: Secondary | ICD-10-CM

## 2014-12-23 DIAGNOSIS — E291 Testicular hypofunction: Secondary | ICD-10-CM | POA: Diagnosis not present

## 2014-12-23 DIAGNOSIS — G4733 Obstructive sleep apnea (adult) (pediatric): Secondary | ICD-10-CM

## 2014-12-23 MED ORDER — TADALAFIL 5 MG PO TABS: ORAL_TABLET | ORAL | Status: DC

## 2014-12-23 NOTE — Progress Notes (Signed)
   Subjective:    Patient ID: [name], male    DOB: 1956/01/27, 58 y.o.   MRN: [id number]  HPI He is here for an interval evaluation. He continues on testosterone and feels as if it is helping a lot. He also continues on his CPAP. His weight has been relatively stable over the last year. His work keeps him busy with travel which interferes with his exercise patterns. He gets good results with his Cialis and would like a refill. He has not seen his surgeon in follow-up on the lap band in several years. He has no other concerns or complaints.   Review of Systems     Objective:   Physical Exam alert and in no distress. Tympanic membranes and canals are normal. Throat is clear. Tonsils are normal. Neck is supple without adenopathy or thyromegaly. Cardiac exam shows a regular sinus rhythm without murmurs or gallops. Lungs are clear to auscultation.        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

## 2014-12-24 ENCOUNTER — Encounter: Payer: Self-pay | Admitting: Family Medicine

## 2014-12-24 ENCOUNTER — Other Ambulatory Visit: Payer: Self-pay

## 2014-12-24 LAB — TESTOSTERONE: TESTOSTERONE: 351.8 ng/dL (ref 300–890)

## 2014-12-24 MED ORDER — TESTOSTERONE 50 MG/5GM (1%) TD GEL: 5.0000 g | Freq: Every day | TRANSDERMAL | Status: DC

## 2015-03-03 ENCOUNTER — Telehealth: Payer: Self-pay | Admitting: Family Medicine

## 2015-03-03 ENCOUNTER — Other Ambulatory Visit: Payer: Self-pay | Admitting: Family Medicine

## 2015-03-03 NOTE — Telephone Encounter (Signed)
Already sent request for this back to Dr. Redmond School to okay this.

## 2015-03-03 NOTE — Telephone Encounter (Signed)
Pharmacy told patient that they never received cialis Rx from 12/21/14  Only testim.  Can you resend or call in?

## 2015-03-03 NOTE — Telephone Encounter (Signed)
Is this okay to refill? 

## 2015-03-17 ENCOUNTER — Ambulatory Visit (INDEPENDENT_AMBULATORY_CARE_PROVIDER_SITE_OTHER): Payer: 59 | Admitting: Family Medicine

## 2015-03-17 ENCOUNTER — Encounter: Payer: Self-pay | Admitting: Family Medicine

## 2015-03-17 VITALS — BP 120/70 | HR 63 | Temp 98.7°F | Wt 239.0 lb

## 2015-03-17 DIAGNOSIS — J309 Allergic rhinitis, unspecified: Secondary | ICD-10-CM | POA: Diagnosis not present

## 2015-03-17 DIAGNOSIS — J4520 Mild intermittent asthma, uncomplicated: Secondary | ICD-10-CM | POA: Diagnosis not present

## 2015-03-17 DIAGNOSIS — J209 Acute bronchitis, unspecified: Secondary | ICD-10-CM | POA: Diagnosis not present

## 2015-03-17 MED ORDER — AZITHROMYCIN 500 MG PO TABS: 500.0000 mg | ORAL_TABLET | Freq: Every day | ORAL | Status: DC

## 2015-03-17 MED ORDER — ALBUTEROL SULFATE HFA 108 (90 BASE) MCG/ACT IN AERS: 2.0000 | INHALATION_SPRAY | Freq: Four times a day (QID) | RESPIRATORY_TRACT | Status: DC | PRN

## 2015-03-17 NOTE — Progress Notes (Signed)
   Subjective:    Patient ID: [name], male    DOB: Dec 16, 1956, 59 y.o.   MRN: [id number]  HPI He is here for a cough that started 4 days ago as a dry cough and progressed to a productive cough and wheezing last night. States he has awakened sweating profusely the past 2 nights.  He also reports that he had body aches 3 days ago but that has resolved. Today he has a frontal headache but denies fever, congestion, drainage, ear pain, sore throat. He has a history of allergic rhinitis and states he usually has problems with this in the fall. Has been using albuterol inhaler for cough, wheezing and states he needs a refill. He does not smoke.    Review of Systems  All other systems reviewed and are negative.      Objective:   Physical Exam Alert and in no distress. Tympanic membranes and canals are normal. No tenderness to sinuses.  Throat is clear. Tonsils are normal. Neck is supple without adenopathy. Cardiac exam shows a regular sinus rhythm without murmurs or gallops. Lungs are clear to auscultation.        Assessment & Plan:  Allergic rhinitis, unspecified allergic rhinitis type  Intrinsic asthma, mild intermittent, uncomplicated - Plan: albuterol (PROVENTIL HFA;VENTOLIN HFA) 108 (90 BASE) MCG/ACT inhaler  Acute bronchitis, unspecified organism - Plan: azithromycin (ZITHROMAX) 500 MG tablet  Continue using albuterol inhaler as needed for asthma and cough. Pick up antibiotic and start taking it if you are not better in 2-3 days.

## 2015-04-28 ENCOUNTER — Telehealth: Payer: Self-pay | Admitting: Family Medicine

## 2015-05-05 NOTE — Telephone Encounter (Signed)
P.A. Testim approved, pt informed, faxed pharmacy

## 2015-07-31 ENCOUNTER — Other Ambulatory Visit: Payer: Self-pay | Admitting: Family Medicine

## 2015-08-03 ENCOUNTER — Other Ambulatory Visit: Payer: Self-pay

## 2015-08-03 NOTE — Telephone Encounter (Signed)
Renew his testosterone but he needs to come in for a follow-up visit and blood work

## 2015-08-03 NOTE — Telephone Encounter (Signed)
Is this okay?

## 2015-08-04 ENCOUNTER — Encounter: Payer: Self-pay | Admitting: Family Medicine

## 2015-08-04 ENCOUNTER — Ambulatory Visit (INDEPENDENT_AMBULATORY_CARE_PROVIDER_SITE_OTHER): Payer: 59 | Admitting: Family Medicine

## 2015-08-04 VITALS — BP 128/70 | HR 62 | Wt 248.0 lb

## 2015-08-04 DIAGNOSIS — E291 Testicular hypofunction: Secondary | ICD-10-CM | POA: Diagnosis not present

## 2015-08-04 DIAGNOSIS — B0089 Other herpesviral infection: Secondary | ICD-10-CM

## 2015-08-04 MED ORDER — ACYCLOVIR 5 % EX OINT: TOPICAL_OINTMENT | CUTANEOUS | Status: DC

## 2015-08-04 NOTE — Progress Notes (Signed)
   Subjective:    Patient ID: [name], male    DOB: Sep 04, 1956, 58 y.o.   MRN: [id number]  HPI He is here for follow-up visit. He has not had testosterone testing done in several months. He continues on Testim without difficulty. He also has a little nasal lesion that tends to recur on a monthly basis. He was seen by Dr. Nena Polio and given Zovirax however he had difficulty taking the pill and did switch to a topical preparation which she would like to continue.   Review of Systems     Objective:   Physical Exam Alert and in no distress. Exam of the nose does show slight erythema at the tip of the nose. Cycles noted.       Assessment & Plan:  Herpes dermatitis - Plan: acyclovir ointment (ZOVIRAX) 5 %  Hypogonadism male - Plan: Testosterone, PSA  he will continue to use of Zovirax topical since he cannot tolerate oral. Routine screening on testosterone and PSA.

## 2015-08-05 ENCOUNTER — Other Ambulatory Visit: Payer: Self-pay

## 2015-08-05 LAB — TESTOSTERONE: Testosterone: 502 ng/dL (ref 300–890)

## 2015-08-05 LAB — PSA: PSA: 0.54 ng/mL (ref <=4.00)

## 2015-08-05 MED ORDER — TESTOSTERONE 50 MG/5GM (1%) TD GEL: TRANSDERMAL | Status: DC

## 2015-08-07 ENCOUNTER — Telehealth: Payer: Self-pay | Admitting: *Deleted

## 2015-08-07 ENCOUNTER — Telehealth: Payer: Self-pay | Admitting: Family Medicine

## 2015-08-07 NOTE — Telephone Encounter (Signed)
Please disregard request for acyclovir tablets-pharmacy faxed over [place] for ointment. You were correct, patient does NOT want capsules.

## 2015-08-07 NOTE — Telephone Encounter (Signed)
Rcvd drug change request from pharmacy with note stating that Acyclovir 5% ointment is not covered. Alternate med include tablet form of this med.

## 2015-08-07 NOTE — Telephone Encounter (Signed)
Cost of ointment is $700, do you want to change to the tablet form and if so what strength and directios please.

## 2015-08-07 NOTE — Telephone Encounter (Signed)
I took care of this earlier. He stated that he wanted the topical

## 2015-08-08 ENCOUNTER — Telehealth: Payer: Self-pay | Admitting: Family Medicine

## 2015-08-08 NOTE — Telephone Encounter (Signed)
Tell hm that the ointment is too expensive and call in valtrex 500 bid for 3days #20 prn refill

## 2015-08-08 NOTE — Telephone Encounter (Signed)
Disregard the last note!!!

## 2015-08-22 ENCOUNTER — Encounter: Payer: Self-pay | Admitting: Family Medicine

## 2015-08-22 NOTE — Telephone Encounter (Signed)
P.A. Denied, appeal letter typed

## 2015-08-24 NOTE — Telephone Encounter (Signed)
Faxed letter of appeal to [phone]

## 2015-09-02 NOTE — Telephone Encounter (Signed)
I think this was to go to you

## 2015-09-02 NOTE — Telephone Encounter (Signed)
See if his insurance will cover regular Zovirax pills and if so I will call in suppressant dosing

## 2015-09-02 NOTE — Telephone Encounter (Signed)
Called member services and Appeal was denied.  No other options, pt will have to use pills or pay out of pocket. Called pt & he states he got the pills and tried breaking them in half but they constantly hang up in his throat and has regurgitation bad afterwards.  The ointment is $770 out of pocket.  He wants to know if there is anything else he could try or a smaller pill? Please advise

## 2015-09-08 ENCOUNTER — Telehealth: Payer: Self-pay | Admitting: Family Medicine

## 2015-09-08 MED ORDER — ACYCLOVIR 200 MG PO CAPS: ORAL_CAPSULE | ORAL | Status: DC

## 2015-09-08 NOTE — Telephone Encounter (Signed)
Spoke with pt and he would like to try the capsules, he says capsules seem to go down a lot better than tablets.  Please Rx Acyclovir capsule 200mg   and send back to me and I will call in to make sure they give him the capsules and not the tablets

## 2015-09-08 NOTE — Telephone Encounter (Signed)
Pt informed and called pharmacy and they will call if there is any problems with insurance

## 2015-09-08 NOTE — Telephone Encounter (Signed)
Called pharmacy and Acyclovir comes in 200mg  capsule & 400mg  tablets that are the same size and also 500 mg tablets which are bigger (either tablet can be broken).  They can order liquid 200/per 5 ml suspension and will be in same day

## 2015-09-08 NOTE — Addendum Note (Signed)
Addended by: Denita Lung on: 09/08/2015 10:11 AM   Modules accepted: Orders

## 2015-09-26 ENCOUNTER — Telehealth: Payer: Self-pay | Admitting: Family Medicine

## 2015-09-28 NOTE — Telephone Encounter (Signed)
P.A. Approved for life on contract, pt informed, faxed pharmacy

## 2015-10-04 IMAGING — RF DG UGI W/ HIGH DENSITY W/KUB
19 of 24 series · 19 of 24 positions shown · non-contrast
Comparison: 06/05/2007.

FLUOROSCOPY TIME:  3 min and 42 seconds

CLINICAL DATA: Left upper quadrant abdominal pain. History of
LapBand procedure.

EXAM:
UPPER GI SERIES W/HIGH DENSITY W/KUB
TECHNIQUE: After obtaining a scout radiograph a routine upper GI series was
performed using thin barium.

[Series 1: run · 1 of 1 slices shown (1 of 19)]
[im 1/1]
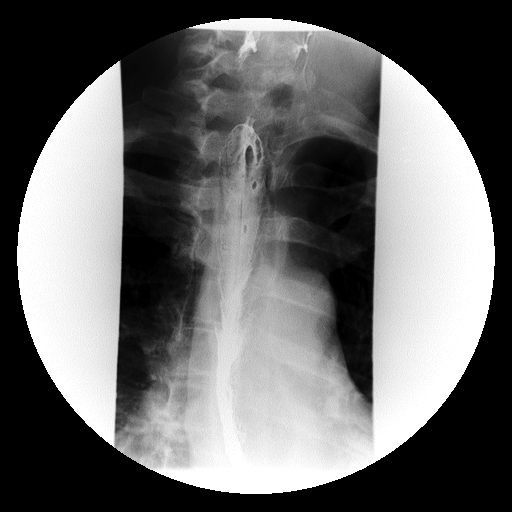

[Series 2: run · 1 of 1 slices shown (2 of 19)]
[im 1/1]
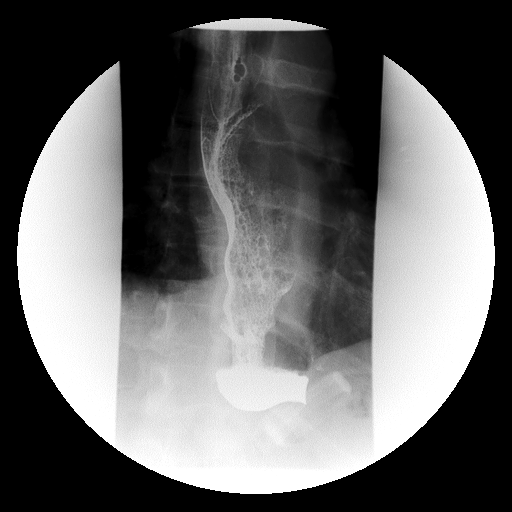

[Series 4: run · 1 of 1 slices shown (3 of 19)]
[im 1/1]
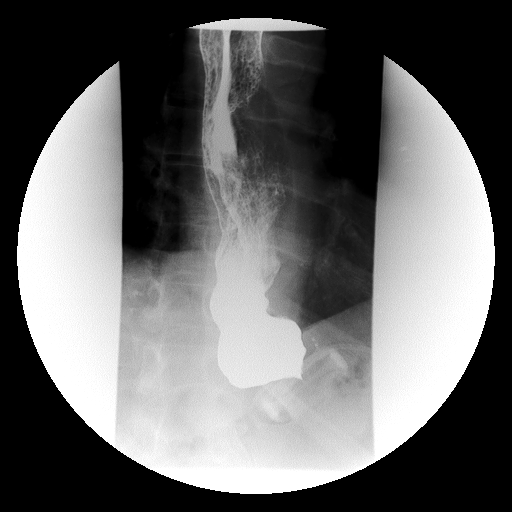

[Series 5: run · 1 of 1 slices shown (4 of 19)]
[im 1/1]
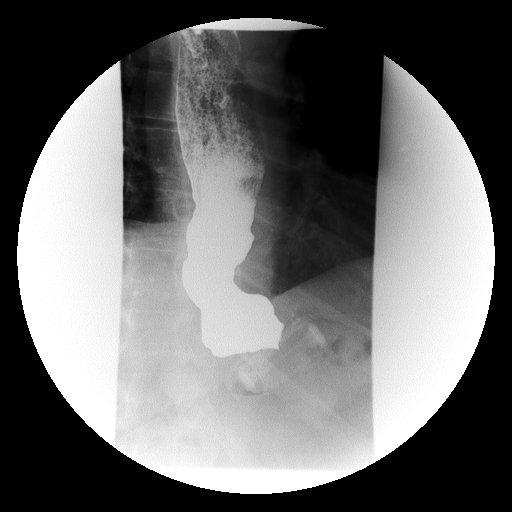

[Series 6: run · 1 of 1 slices shown (5 of 19)]
[im 1/1]
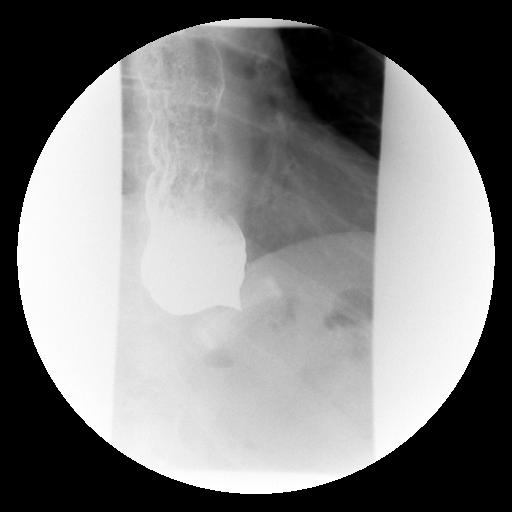

[Series 7: run · 1 of 1 slices shown (6 of 19)]
[im 1/1]
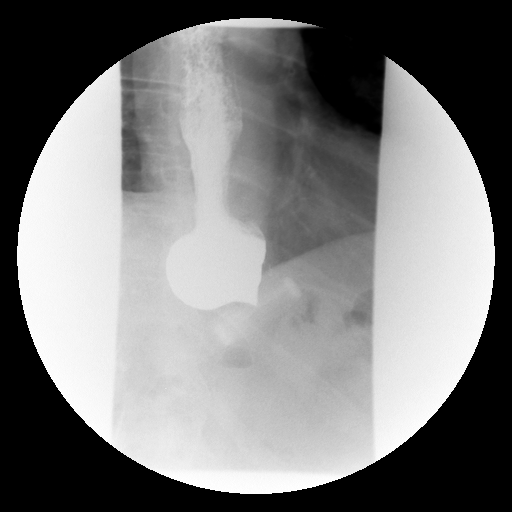

[Series 9: run · 1 of 1 slices shown (7 of 19)]
[im 1/1]
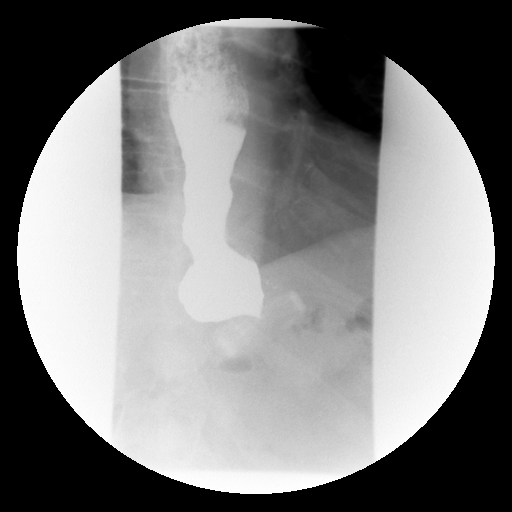

[Series 10: run · 1 of 1 slices shown (8 of 19)]
[im 1/1]
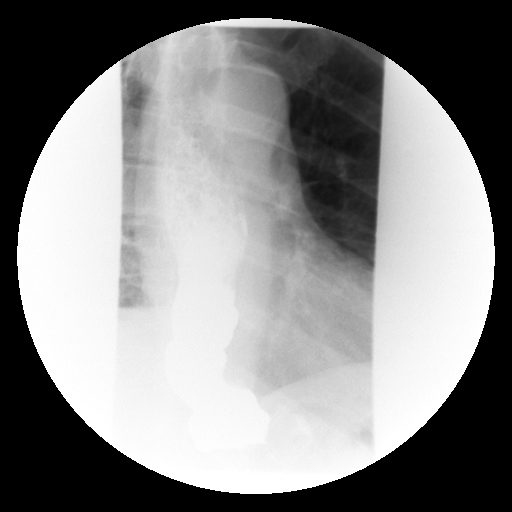

[Series 11: run · 1 of 1 slices shown (9 of 19)]
[im 1/1]
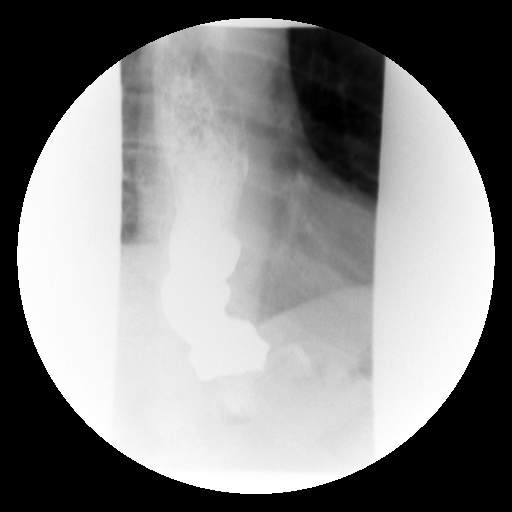

[Series 13: run · 1 of 1 slices shown (10 of 19)]
[im 1/1]
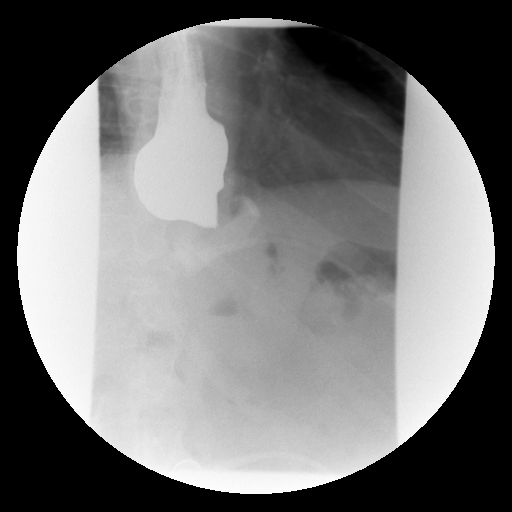

[Series 14: run · 1 of 1 slices shown (11 of 19)]
[im 1/1]
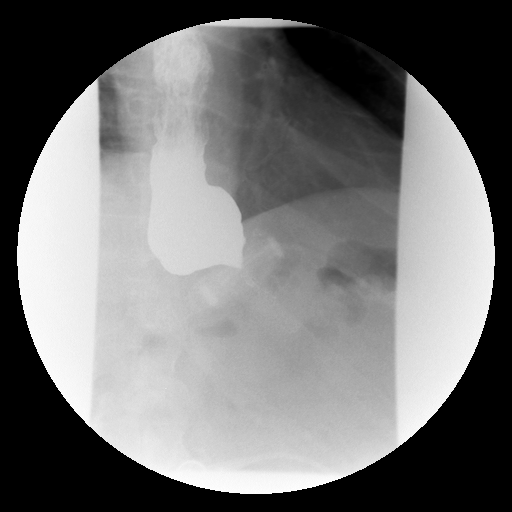

[Series 15: run · 1 of 1 slices shown (12 of 19)]
[im 1/1]
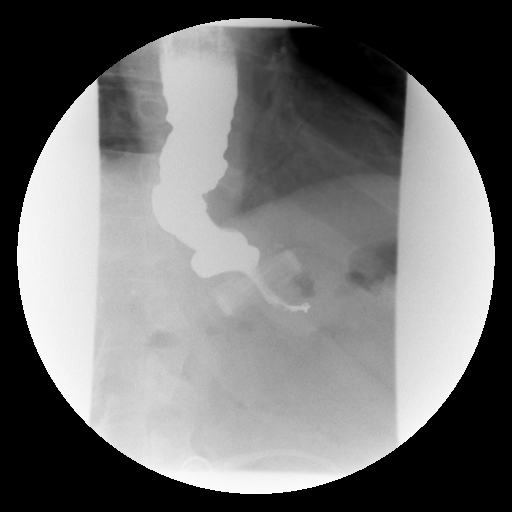

[Series 16: run · 1 of 1 slices shown (13 of 19)]
[im 1/1]
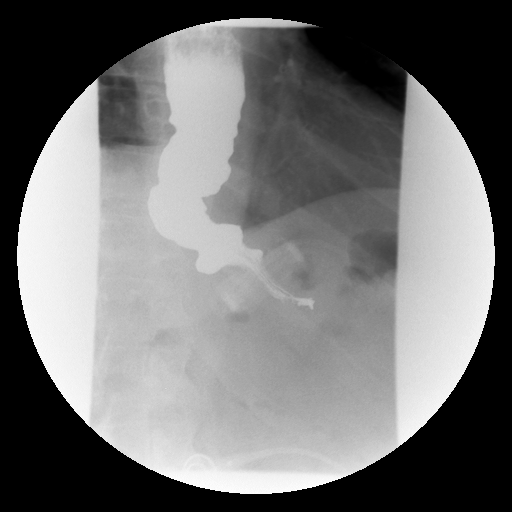

[Series 18: run · 1 of 1 slices shown (14 of 19)]
[im 1/1]
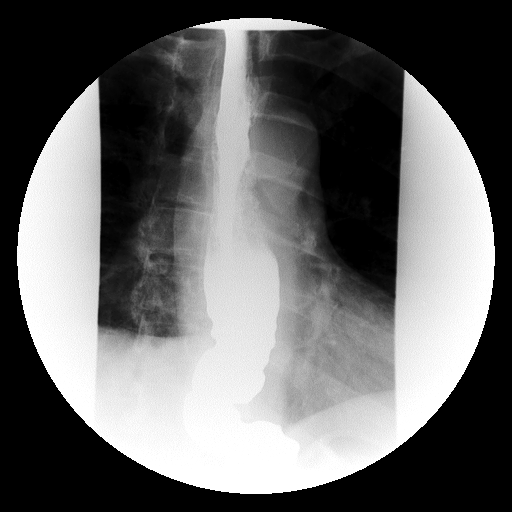

[Series 19: run · 1 of 1 slices shown (15 of 19)]
[im 1/1]
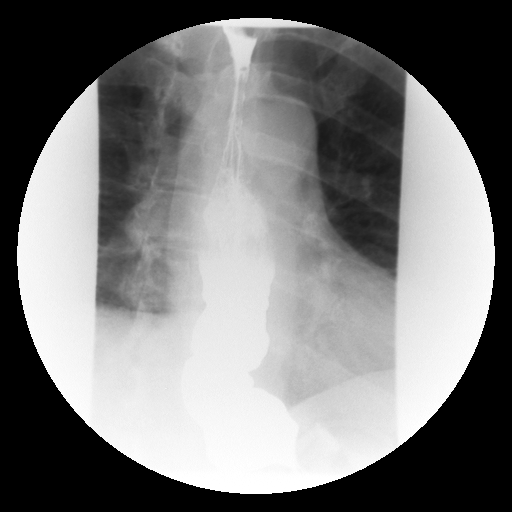

[Series 20: run · 1 of 1 slices shown (16 of 19)]
[im 1/1]
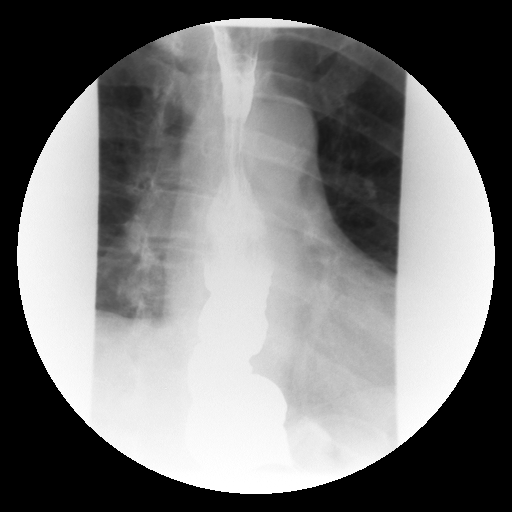

[Series 21: run · 1 of 1 slices shown (17 of 19)]
[im 1/1]
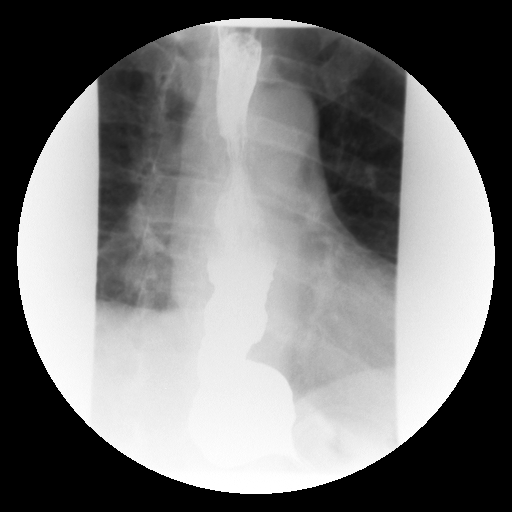

[Series 23: run · 1 of 1 slices shown (18 of 19)]
[im 1/1]
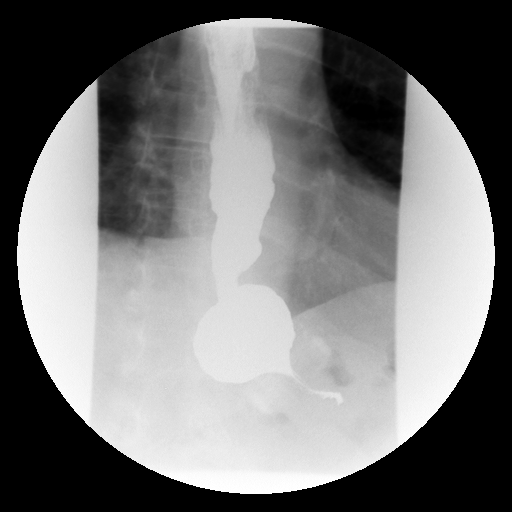

[Series 24: run · 1 of 1 slices shown (19 of 19)]
[im 1/1]
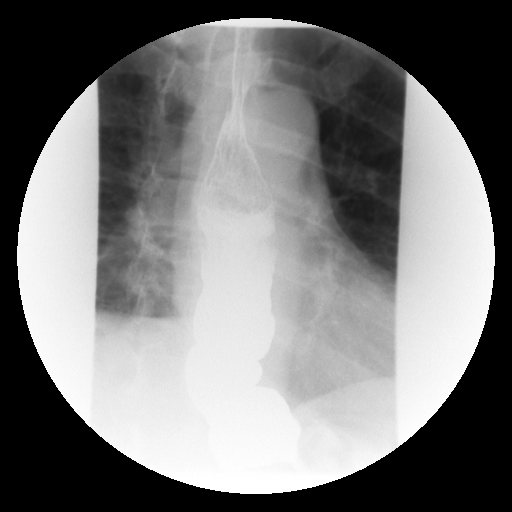

[19 of 24 positions shown; findings below may reference images not displayed]

FINDINGS: Esophagus is patulous. Esophageal motility was generally normal, but
several mild tertiary contractions were observed throughout the
examination. Additionally, retained food products were noted in the
esophagus (patient reports not eating in the past 12 hrs). The
patient's LapBand is oriented from the 2 o'clock the 8 o'clock
position, and is subdiaphragmatic. The proximal gastric pouch was
normal in appearance. After 5 min of observation a trace amount of
barium extended through the region of the LapBand. However, this was
only a very small amount, and despite 9 min of observation during
the examination, only a trace amount of barium ever extended past
the LapBand. The more distal aspect of the stomach and duodenum
could not be evaluated.
IMPRESSION: 1. The patient's LapBand appears appropriately positioned, however,
during 9 min of observation on today's examination only a trace
amount of barium traversed the region of the LapBand. As the patient
reports no recent LapBand adjustments, this finding suggests edema
or inflammation in the stomach at the site of the LapBand. Clinical
correlation is suggested.

## 2015-10-07 ENCOUNTER — Other Ambulatory Visit (INDEPENDENT_AMBULATORY_CARE_PROVIDER_SITE_OTHER): Payer: 59

## 2015-10-07 DIAGNOSIS — Z23 Encounter for immunization: Secondary | ICD-10-CM | POA: Diagnosis not present

## 2015-10-30 ENCOUNTER — Other Ambulatory Visit: Payer: Self-pay | Admitting: Family Medicine

## 2015-10-30 NOTE — Telephone Encounter (Signed)
Is this okay?

## 2015-12-08 ENCOUNTER — Ambulatory Visit (INDEPENDENT_AMBULATORY_CARE_PROVIDER_SITE_OTHER): Payer: 59 | Admitting: Family Medicine

## 2015-12-08 ENCOUNTER — Encounter: Payer: Self-pay | Admitting: Family Medicine

## 2015-12-08 VITALS — BP 140/88 | HR 76 | Temp 98.5°F | Ht 73.5 in | Wt 245.6 lb

## 2015-12-08 DIAGNOSIS — J069 Acute upper respiratory infection, unspecified: Secondary | ICD-10-CM | POA: Diagnosis not present

## 2015-12-08 NOTE — Progress Notes (Signed)
   Subjective:    Patient ID: [name], male    DOB: [birth year], 59 y.o.   MRN: [id number]  HPI He complains of a one day history of nasal congestion, sneezing, slight sore throat, dry cough, fatigue and malaise as well as drainage more from the left eye.   Review of Systems     Objective:   Physical Exam Alert and in no distress. Tympanic membranes and canals are normal. Both conjunctiva appear normal. There is slight swelling of the left upper and lower lids. No erythema. Pharyngeal area is normal. Neck is supple without adenopathy or thyromegaly. Cardiac exam shows a regular sinus rhythm without murmurs or gallops. Lungs are clear to auscultation.        Assessment & Plan:  Acute URI  recommend supportive care. Call in one week if still having difficulty.

## 2015-12-08 NOTE — Patient Instructions (Signed)
Claritin or Allegra for the runny nose and Afrin at night to help  your nose. Tylenol for aches and pains. Use NyQuil at night for the coughing

## 2015-12-18 ENCOUNTER — Other Ambulatory Visit: Payer: Self-pay | Admitting: Family Medicine

## 2016-01-11 ENCOUNTER — Ambulatory Visit (INDEPENDENT_AMBULATORY_CARE_PROVIDER_SITE_OTHER): Payer: 59 | Admitting: Family Medicine

## 2016-01-11 ENCOUNTER — Encounter: Payer: Self-pay | Admitting: Family Medicine

## 2016-01-11 VITALS — BP 130/88 | HR 68 | Temp 98.2°F | Ht 73.5 in | Wt 247.6 lb

## 2016-01-11 DIAGNOSIS — Z1159 Encounter for screening for other viral diseases: Secondary | ICD-10-CM | POA: Diagnosis not present

## 2016-01-11 DIAGNOSIS — B009 Herpesviral infection, unspecified: Secondary | ICD-10-CM

## 2016-01-11 DIAGNOSIS — D649 Anemia, unspecified: Secondary | ICD-10-CM | POA: Diagnosis not present

## 2016-01-11 LAB — CBC WITH DIFFERENTIAL/PLATELET
BASOS PCT: 0 % (ref 0–1)
Basophils Absolute: 0 10*3/uL (ref 0.0–0.1)
Eosinophils Absolute: 0.2 10*3/uL (ref 0.0–0.7)
Eosinophils Relative: 4 % (ref 0–5)
HEMATOCRIT: 40.5 % (ref 39.0–52.0)
HEMOGLOBIN: 12.5 g/dL — AB (ref 13.0–17.0)
LYMPHS PCT: 40 % (ref 12–46)
Lymphs Abs: 2.2 10*3/uL (ref 0.7–4.0)
MCH: 25.5 pg — AB (ref 26.0–34.0)
MCHC: 30.9 g/dL (ref 30.0–36.0)
MCV: 82.5 fL (ref 78.0–100.0)
MONO ABS: 0.4 10*3/uL (ref 0.1–1.0)
MPV: 10 fL (ref 8.6–12.4)
Monocytes Relative: 7 % (ref 3–12)
NEUTROS ABS: 2.7 10*3/uL (ref 1.7–7.7)
Neutrophils Relative %: 49 % (ref 43–77)
Platelets: 241 10*3/uL (ref 150–400)
RBC: 4.91 MIL/uL (ref 4.22–5.81)
RDW: 15.1 % (ref 11.5–15.5)
WBC: 5.6 10*3/uL (ref 4.0–10.5)

## 2016-01-11 NOTE — Progress Notes (Signed)
   Subjective:    Patient ID: [name], male    DOB: 22-Dec-1956, 60 y.o.   MRN: [id number]  HPI He is here for consult concerning possible history of anemia. He apparently has been trying to give blood on the 2 separate occasions and told us blood count was too low. He is here for further evaluation. He has not noted any easy bruising. He has had a normal colonoscopy. He does have a herpetic infection present on his nose and presently is treating this with Valtrex.   Review of Systems     Objective:   Physical Exam Healing one and half centimeter lesion noted on the tip of the nose on the right.       Assessment & Plan:  Herpes infection  Need for hepatitis C screening test - Plan: CBC with Differential/Platelet  Anemia, unspecified anemia type - Plan: Hepatitis C antibody, Iron, TIBC and Ferritin Panel He will continue on his Valtrex. I will do routine blood screening on him to further evaluate the possible anemia issue.

## 2016-01-12 LAB — IRON,TIBC AND FERRITIN PANEL
%SAT: 8 % — AB (ref 15–60)
Ferritin: 9 ng/mL — ABNORMAL LOW (ref 22–322)
Iron: 30 ug/dL — ABNORMAL LOW (ref 50–180)
TIBC: 375 ug/dL (ref 250–425)

## 2016-01-12 LAB — HEPATITIS C ANTIBODY: HCV Ab: NEGATIVE

## 2016-01-12 NOTE — Addendum Note (Signed)
Addended by: Denita Lung on: 01/12/2016 08:17 AM   Modules accepted: Orders

## 2016-01-13 ENCOUNTER — Telehealth: Payer: Self-pay

## 2016-01-13 NOTE — Telephone Encounter (Signed)
error 

## 2016-01-22 ENCOUNTER — Telehealth: Payer: Self-pay | Admitting: Family Medicine

## 2016-01-25 NOTE — Telephone Encounter (Signed)
P.A. 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 Do you want to switch?

## 2016-02-04 NOTE — Telephone Encounter (Signed)
Discussed with Dr. Redmond School & Pt understands ins will not cover ointment & pt will have to pay out of pocket

## 2016-02-04 NOTE — Telephone Encounter (Signed)
Called pt & informed, he states ins is paying for the pills but not the ointment

## 2016-02-23 ENCOUNTER — Telehealth: Payer: Self-pay | Admitting: Family Medicine

## 2016-02-23 NOTE — Telephone Encounter (Signed)
Pt called to let Dr Redmond School know that he went to [facility] ER on Sunday for 52ml kidney stone. He was told to f/u with urologist at [place], Dr Dolphus Jenny and to let Dr. Redmond School know that he had been to ER. Pt still has not passed stone, been taking Flomax and pain pills constantly but will be out of pain pills soon. How long should he wait to see if he passes the stone? Does Dr. Redmond School prefer that pt see a different urologist? Will pt need to see Dr Redmond School to get more pain pills or what should he do until seen by urologist?

## 2016-02-23 NOTE — Telephone Encounter (Signed)
Let him know that the size of the stone indicates he will probably pass this on his own. Recommend that he follow-up here rather than urology office. Urologist is only can be needed if the stone doesn't pass and I would give it a week or 2. Have him drink plenty of fluids If he needs pain medication, I will need to see him

## 2016-03-16 ENCOUNTER — Other Ambulatory Visit: Payer: Self-pay | Admitting: Family Medicine

## 2016-03-16 NOTE — Telephone Encounter (Signed)
He needs a follow-up visit concerning his testosterone. Don't let him run out

## 2016-03-16 NOTE — Telephone Encounter (Signed)
Last OV 01/11/2016  Last filled Testim- 08/05/15 Ventolin 12/18/15

## 2016-03-17 ENCOUNTER — Other Ambulatory Visit: Payer: Self-pay | Admitting: Family Medicine

## 2016-03-17 NOTE — Telephone Encounter (Signed)
Is this okay?

## 2016-03-17 NOTE — Telephone Encounter (Signed)
He needs blood work but don't let him run out

## 2016-03-21 ENCOUNTER — Other Ambulatory Visit: Payer: Self-pay | Admitting: Family Medicine

## 2016-03-21 NOTE — Telephone Encounter (Signed)
Medication called into [facility].  Patient advised to make appointment.

## 2016-03-22 ENCOUNTER — Other Ambulatory Visit: Payer: Self-pay | Admitting: Family Medicine

## 2016-03-24 ENCOUNTER — Telehealth: Payer: Self-pay | Admitting: Family Medicine

## 2016-03-24 MED ORDER — TADALAFIL 5 MG PO TABS: ORAL_TABLET | ORAL | Status: DC

## 2016-03-24 NOTE — Telephone Encounter (Signed)
Recvd refill request for Cialis 5 mg along with a note stating that we denied refill request saying too soon for refill but per pharmacy pt last refilled this on 12/18/15 & need refills now

## 2016-04-04 ENCOUNTER — Ambulatory Visit (INDEPENDENT_AMBULATORY_CARE_PROVIDER_SITE_OTHER): Payer: 59 | Admitting: Family Medicine

## 2016-04-04 ENCOUNTER — Encounter: Payer: Self-pay | Admitting: Family Medicine

## 2016-04-04 ENCOUNTER — Telehealth: Payer: Self-pay

## 2016-04-04 ENCOUNTER — Telehealth: Payer: Self-pay | Admitting: Gastroenterology

## 2016-04-04 VITALS — BP 130/90 | HR 68 | Ht 73.0 in | Wt 238.6 lb

## 2016-04-04 DIAGNOSIS — K625 Hemorrhage of anus and rectum: Secondary | ICD-10-CM

## 2016-04-04 DIAGNOSIS — Z8719 Personal history of other diseases of the digestive system: Secondary | ICD-10-CM | POA: Diagnosis not present

## 2016-04-04 LAB — CBC WITH DIFFERENTIAL/PLATELET
BASOS ABS: 60 {cells}/uL (ref 0–200)
Basophils Relative: 1 %
EOS ABS: 480 {cells}/uL (ref 15–500)
EOS PCT: 8 %
HEMATOCRIT: 39.4 % (ref 38.5–50.0)
HEMOGLOBIN: 12.3 g/dL — AB (ref 13.2–17.1)
LYMPHS ABS: 2460 {cells}/uL (ref 850–3900)
Lymphocytes Relative: 41 %
MCH: 25.8 pg — AB (ref 27.0–33.0)
MCHC: 31.2 g/dL — AB (ref 32.0–36.0)
MCV: 82.6 fL (ref 80.0–100.0)
MONO ABS: 480 {cells}/uL (ref 200–950)
MPV: 9.9 fL (ref 7.5–12.5)
Monocytes Relative: 8 %
NEUTROS PCT: 42 %
Neutro Abs: 2520 cells/uL (ref 1500–7800)
Platelets: 238 10*3/uL (ref 140–400)
RBC: 4.77 MIL/uL (ref 4.20–5.80)
RDW: 15.4 % — ABNORMAL HIGH (ref 11.0–15.0)
WBC: 6 10*3/uL (ref 4.0–10.5)

## 2016-04-04 NOTE — Telephone Encounter (Signed)
Patient scheduled for tomorrow 04/05/16 3:15 with Dr. Carlean Purl .  [employer] will notify the patient

## 2016-04-04 NOTE — Progress Notes (Signed)
   Subjective:    Patient ID: [name], male    DOB: 12-02-1956, 60 y.o.   MRN: [id number]  HPI He notes the onset of bright red rectal bleeding that started last night. He has had for 5 the stools since then. He has had no abdominal pain, fever, chills, nausea or vomiting. He does have a previous history of diverticulosis.   Review of Systems     Objective:   Physical Exam Alert and in no distress. Anal exam shows no external hemorrhoids. Anoscopy was performed approximately 3 cm into the rectum and dark blood was noted.       Assessment & Plan:  Rectal bleeding - Plan: CBC with Differential/Platelet, Ambulatory referral to Gastroenterology  History of diverticulosis - Plan: CBC with Differential/Platelet He is really not having any symptoms of sound like diverticulitis but he could possibly have a bleeding diverticulum. Plan to refer to GI for further consultation concerning this.

## 2016-04-04 NOTE — Telephone Encounter (Signed)
Patient informed of appointment 04/05/16 at 3 pm with Dr.Gessner at [facility] pt confirmed understanding

## 2016-04-05 ENCOUNTER — Ambulatory Visit (INDEPENDENT_AMBULATORY_CARE_PROVIDER_SITE_OTHER): Payer: 59 | Admitting: Internal Medicine

## 2016-04-05 ENCOUNTER — Encounter: Payer: Self-pay | Admitting: Internal Medicine

## 2016-04-05 ENCOUNTER — Other Ambulatory Visit (INDEPENDENT_AMBULATORY_CARE_PROVIDER_SITE_OTHER): Payer: 59

## 2016-04-05 VITALS — BP 136/86 | HR 72 | Ht 73.0 in | Wt 239.8 lb

## 2016-04-05 DIAGNOSIS — D62 Acute posthemorrhagic anemia: Secondary | ICD-10-CM | POA: Diagnosis not present

## 2016-04-05 DIAGNOSIS — E611 Iron deficiency: Secondary | ICD-10-CM

## 2016-04-05 DIAGNOSIS — K921 Melena: Secondary | ICD-10-CM

## 2016-04-05 LAB — CBC WITH DIFFERENTIAL/PLATELET
BASOS PCT: 0.5 % (ref 0.0–3.0)
Basophils Absolute: 0 10*3/uL (ref 0.0–0.1)
Eosinophils Absolute: 0.6 10*3/uL (ref 0.0–0.7)
Eosinophils Relative: 7.3 % — ABNORMAL HIGH (ref 0.0–5.0)
HEMATOCRIT: 37.5 % — AB (ref 39.0–52.0)
Hemoglobin: 12.1 g/dL — ABNORMAL LOW (ref 13.0–17.0)
LYMPHS PCT: 41.9 % (ref 12.0–46.0)
Lymphs Abs: 3.2 10*3/uL (ref 0.7–4.0)
MCHC: 32.3 g/dL (ref 30.0–36.0)
MCV: 81.2 fl (ref 78.0–100.0)
MONOS PCT: 7.2 % (ref 3.0–12.0)
Monocytes Absolute: 0.6 10*3/uL (ref 0.1–1.0)
Neutro Abs: 3.3 10*3/uL (ref 1.4–7.7)
Neutrophils Relative %: 43.1 % (ref 43.0–77.0)
PLATELETS: 224 10*3/uL (ref 150.0–400.0)
RBC: 4.62 Mil/uL (ref 4.22–5.81)
RDW: 16.7 % — AB (ref 11.5–15.5)
WBC: 7.7 10*3/uL (ref 4.0–10.5)

## 2016-04-05 NOTE — Patient Instructions (Addendum)
  You have been scheduled for a colonoscopy. Please follow written instructions given to you at your visit today.  Please pick up your over the counter prep supplies at the pharmacy. If you use inhalers (even only as needed), please bring them with you on the day of your procedure. Your physician has requested that you go to www.startemmi.com and enter the access code given to you at your visit today. This web site gives a general overview about your procedure. However, you should still follow specific instructions given to you by our office regarding your preparation for the procedure.    Your physician has requested that you go to the basement for the following lab work before leaving today: CBC/diff   Go to the ED if you have more bleeding.    If you are age 46 or younger, your body mass index should be between 19-25. Your Body mass index is 31.64 kg/(m^2). If this is out of the aformentioned range listed, please consider follow up with your Primary Care Provider.     I appreciate the opportunity to care for you.

## 2016-04-05 NOTE — Progress Notes (Signed)
  Referred by: Denita Lung, MD [address] [place], [place] [address]  Subjective:    Patient ID: [name], male    DOB: [birth year], 60 y.o.   MRN: [id number] Cc: passing blood HPI Pleasant middle-aged wm here after 2 d of hematochezia. Wife w/ him. Had painless bloody stools x 4-5 x 2 d ago - saw dr. Redmond School yesteday and hgb 12ish - since then 1-2 darker red stools, small amounts. Feels ok - no orthostasis, abd pain. No melena. No anti-coagulants or excessive NSAI. Hx colonoscopy DRP 2013 - diverticulosis. 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  Medications, allergies, past medical history, past surgical history, family history and social history are reviewed and updated in the EMR. Review of Systems As above, otherwise all negative    Objective:   Physical Exam @BP  136/86 mmHg  Pulse 72  Ht 6\' 1"  (1.854 m)  Wt 239 lb 12.8 oz (108.773 kg)  BMI 31.64 kg/m2@  General:  Well-developed, well-nourished and in no acute distress Eyes:  anicteric. Lungs: Clear to auscultation bilaterally. Heart:  S1S2, no rubs, murmurs, gallops. Abdomen:  soft, non-tender, no hepatosplenomegaly, hernia, or mass and BS+. Does lave lap band port medial RUQ  Lymph:  no cervical or supraclavicular adenopathy. Extremities:   no edema, cyanosis or clubbing Skin   no rash. Pale compexion pink nailbeds good refill Neuro:  A&O x 3.  Psych:  appropriate mood and  Affect.   Data Reviewed: CBC Latest Ref Rng 04/05/2016 04/04/2016 01/11/2016  WBC 4.0 - 10.5 K/uL 7.7 6.0 5.6  Hemoglobin 13.0 - 17.0 g/dL 12.1(L) 12.3(L) 12.5(L)  Hematocrit 39.0 - 52.0 % 37.5(L) 39.4 40.5  Platelets 150.0 - 400.0 K/uL 224.0 238 241      Assessment & Plan:   Encounter Diagnoses  Name Primary?  . Hematochezia Yes  . Acute blood loss anemia   . 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 [id number] Juanda Crumble, MD

## 2016-04-06 NOTE — Progress Notes (Signed)
Quick Note:  Blood count stable - basically the same No new plans ______

## 2016-04-20 ENCOUNTER — Ambulatory Visit (AMBULATORY_SURGERY_CENTER): Payer: 59 | Admitting: Internal Medicine

## 2016-04-20 ENCOUNTER — Encounter: Payer: Self-pay | Admitting: Internal Medicine

## 2016-04-20 ENCOUNTER — Other Ambulatory Visit (INDEPENDENT_AMBULATORY_CARE_PROVIDER_SITE_OTHER): Payer: 59

## 2016-04-20 VITALS — BP 124/94 | HR 58 | Temp 98.7°F | Resp 18 | Ht 73.0 in | Wt 239.0 lb

## 2016-04-20 DIAGNOSIS — K921 Melena: Secondary | ICD-10-CM

## 2016-04-20 DIAGNOSIS — D128 Benign neoplasm of rectum: Secondary | ICD-10-CM

## 2016-04-20 DIAGNOSIS — K5730 Diverticulosis of large intestine without perforation or abscess without bleeding: Secondary | ICD-10-CM | POA: Diagnosis not present

## 2016-04-20 DIAGNOSIS — D123 Benign neoplasm of transverse colon: Secondary | ICD-10-CM | POA: Diagnosis not present

## 2016-04-20 DIAGNOSIS — D129 Benign neoplasm of anus and anal canal: Secondary | ICD-10-CM

## 2016-04-20 DIAGNOSIS — D122 Benign neoplasm of ascending colon: Secondary | ICD-10-CM | POA: Diagnosis not present

## 2016-04-20 LAB — CBC
HCT: 35.9 % — ABNORMAL LOW (ref 39.0–52.0)
Hemoglobin: 11.6 g/dL — ABNORMAL LOW (ref 13.0–17.0)
MCHC: 32.3 g/dL (ref 30.0–36.0)
MCV: 80.7 fl (ref 78.0–100.0)
PLATELETS: 241 10*3/uL (ref 150.0–400.0)
RBC: 4.45 Mil/uL (ref 4.22–5.81)
RDW: 16 % — ABNORMAL HIGH (ref 11.5–15.5)
WBC: 5.9 10*3/uL (ref 4.0–10.5)

## 2016-04-20 MED ORDER — SODIUM CHLORIDE 0.9 % IV SOLN: 500.0000 mL | INTRAVENOUS | Status: DC

## 2016-04-20 NOTE — Patient Instructions (Addendum)
I think you had bleeding from diverticulosis. I found 4 tiny polyps that look benign and I removed them.  I will let you know pathology results and when to have another routine colonoscopy by mail.  I appreciate the opportunity to care for you. Gatha Mayer, MD, FACG YOU HAD AN ENDOSCOPIC PROCEDURE TODAY AT [facility]:   Refer to the procedure report that was given to you for any specific questions about what was found during the examination.  If the procedure report does not answer your questions, please call your gastroenterologist to clarify.  If you requested that your care partner not be given the details of your procedure findings, then the procedure report has been included in a sealed envelope for you to review at your convenience later.  YOU SHOULD EXPECT: Some feelings of bloating in the abdomen. Passage of more gas than usual.  Walking can help get rid of the air that was put into your GI tract during the procedure and reduce the bloating. If you had a lower endoscopy (such as a colonoscopy or flexible sigmoidoscopy) you may notice spotting of blood in your stool or on the toilet paper. If you underwent a bowel prep for your procedure, you may not have a normal bowel movement for a few days.  Please Note:  You might notice some irritation and congestion in your nose or some drainage.  This is from the oxygen used during your procedure.  There is no need for concern and it should clear up in a day or so.  SYMPTOMS TO REPORT IMMEDIATELY:   Following lower endoscopy (colonoscopy or flexible sigmoidoscopy):  Excessive amounts of blood in the stool  Significant tenderness or worsening of abdominal pains  Swelling of the abdomen that is new, acute  Fever of 100F or higher   Following upper endoscopy (EGD)  Vomiting of blood or coffee ground material  New chest pain or pain under the shoulder blades  Painful or persistently difficult swallowing  New  shortness of breath  Fever of 100F or higher  Black, tarry-looking stools  For urgent or emergent issues, a gastroenterologist can be reached at any hour by calling [phone].   DIET: Your first meal following the procedure should be a small meal and then it is ok to progress to your normal diet. Heavy or fried foods are harder to digest and may make you feel nauseous or bloated.  Likewise, meals heavy in dairy and vegetables can increase bloating.  Drink plenty of fluids but you should avoid alcoholic beverages for 24 hours.  ACTIVITY:  You should plan to take it easy for the rest of today and you should NOT DRIVE or use heavy machinery until tomorrow (because of the sedation medicines used during the test).    FOLLOW UP: Our staff will call the number listed on your records the next business day following your procedure to check on you and address any questions or concerns that you may have regarding the information given to you following your procedure. If we do not reach you, we will leave a message.  However, if you are feeling well and you are not experiencing any problems, there is no need to return our call.  We will assume that you have returned to your regular daily activities without incident.  If any biopsies were taken you will be contacted by phone or by letter within the next 1-3 weeks.  Please call us at [phone] if  you have not heard about the biopsies in 3 weeks.    SIGNATURES/CONFIDENTIALITY: You and/or your care partner have signed paperwork which will be entered into your electronic medical record.  These signatures attest to the fact that that the information above on your After Visit Summary has been reviewed and is understood.  Full responsibility of the confidentiality of this discharge information lies with you and/or your care-partner.  Polyp and diverticulosis information given.  CBC today.

## 2016-04-20 NOTE — Progress Notes (Signed)
Called to room to assist during endoscopic procedure.  Patient ID and intended procedure confirmed with present staff. Received instructions for my participation in the procedure from the performing physician.  

## 2016-04-20 NOTE — Progress Notes (Signed)
A and Ox 3 Report to RN 

## 2016-04-20 NOTE — Op Note (Signed)
[place] Patient Name: [name] Procedure Date: 04/20/2016 10:47 AM MRN: [id number] Endoscopist: Gatha Mayer , MD Age: 60 Date of Birth: Feb 08, 1956 Gender: Male Procedure:                Colonoscopy Indications:              Evaluation of unexplained GI bleeding Medicines:                Propofol per Anesthesia, Monitored Anesthesia Care Procedure:                Pre-Anesthesia Assessment:                           - Prior to the procedure, a History and Physical                            was performed, and patient medications and                            allergies were reviewed. The patient's tolerance of                            previous anesthesia was also reviewed. The risks                            and benefits of the procedure and the sedation                            options and risks were discussed with the patient.                            All questions were answered, and informed consent                            was obtained. Prior Anticoagulants: The patient has                            taken no previous anticoagulant or antiplatelet                            agents. ASA Grade Assessment: II - A patient with                            mild systemic disease. After reviewing the risks                            and benefits, the patient was deemed in                            satisfactory condition to undergo the procedure.                           After obtaining informed consent, the colonoscope  was passed under direct vision. Throughout the                            procedure, the patient's blood pressure, pulse, and                            oxygen saturations were monitored continuously. The                            Model CF-HQ190L [id number]) scope was introduced                            through the anus and advanced to the the cecum,                            identified by appendiceal orifice and  ileocecal                            valve. The colonoscopy was performed without                            difficulty. The patient tolerated the procedure                            well. The quality of the bowel preparation was                            good. The bowel preparation used was Miralax. The                            ileocecal valve, appendiceal orifice, and rectum                            were photographed. Scope In: 11:00:44 AM Scope Out: 11:13:16 AM Scope Withdrawal Time: 0 hours 10 minutes 7 seconds  Total Procedure Duration: 0 hours 12 minutes 32 seconds  Findings:                 The perianal and digital rectal examinations were                            normal. Pertinent negatives include normal prostate                            (size, shape, and consistency).                           Four sessile polyps were found in the rectum,                            transverse colon and ascending colon. The polyps                            were 3 to 5 mm in size. These polyps were removed  with a cold snare. Resection and retrieval were                            complete. Verification of patient identification                            for the specimen was done. Estimated blood loss was                            minimal.                           Multiple diverticula were found in the sigmoid                            colon. There was no evidence of diverticular                            bleeding.                           The exam was otherwise without abnormality on                            direct and retroflexion views. Complications:            No immediate complications. Estimated Blood Loss:     Estimated blood loss was minimal. Impression:               - Four 3 to 5 mm polyps in the rectum, in the                            transverse colon and in the ascending colon,                            removed with a cold snare.  Resected and retrieved.                           - Severe diverticulosis in the sigmoid colon. There                            was no evidence of diverticular bleeding.                           - The examination was otherwise normal on direct                            and retroflexion views. Recommendation:           - Patient has a contact number available for                            emergencies. The signs and symptoms of potential                            delayed complications  were discussed with the                            patient. Return to normal activities tomorrow.                            Written discharge instructions were provided to the                            patient.                           - Resume previous diet.                           - Continue present medications.                           - Repeat colonoscopy is recommended for                            surveillance. The colonoscopy date will be                            determined after pathology results from today's                            exam become available for review.                           - CBC today - suspect bleeding he had was from                            diverticulosis. Gatha Mayer, MD 04/20/2016 11:25:43 AM This report has been signed electronically. CC Letter to:             Denita Lung, MD

## 2016-04-20 NOTE — Progress Notes (Signed)
To lab for CBC prior to discharge.

## 2016-04-21 ENCOUNTER — Encounter: Payer: Self-pay | Admitting: Internal Medicine

## 2016-04-21 ENCOUNTER — Telehealth: Payer: Self-pay

## 2016-04-21 NOTE — Progress Notes (Signed)
Quick Note:  Hgb slightly low still I recommend ferrous sulfate 325 mg bid and f/u PCP this summer (July/Aug) for recheck Have cced Dr. Redmond School ______

## 2016-04-21 NOTE — Telephone Encounter (Signed)
  Follow up Call-  Call back number 04/20/2016  Post procedure Call Back phone  # [phone]  Permission to leave phone message Yes     Patient questions:  Do you have a fever, pain , or abdominal swelling? No. Pain Score  0 *  Have you tolerated food without any problems? Yes.    Have you been able to return to your normal activities? Yes.    Do you have any questions about your discharge instructions: Diet   No. Medications  No. Follow up visit  No.  Do you have questions or concerns about your Care? No.  Actions: * If pain score is 4 or above: No action needed, pain <4.

## 2016-04-25 ENCOUNTER — Encounter: Payer: Self-pay | Admitting: Internal Medicine

## 2016-04-25 NOTE — Progress Notes (Signed)
Quick Note:  2 adenomas recall 2020 ______

## 2016-05-01 ENCOUNTER — Other Ambulatory Visit: Payer: Self-pay | Admitting: Family Medicine

## 2016-05-02 ENCOUNTER — Other Ambulatory Visit: Payer: Self-pay

## 2016-05-02 ENCOUNTER — Other Ambulatory Visit: Payer: 59

## 2016-05-02 ENCOUNTER — Telehealth: Payer: Self-pay | Admitting: Family Medicine

## 2016-05-02 DIAGNOSIS — D649 Anemia, unspecified: Secondary | ICD-10-CM | POA: Insufficient documentation

## 2016-05-02 DIAGNOSIS — R7989 Other specified abnormal findings of blood chemistry: Secondary | ICD-10-CM

## 2016-05-02 LAB — CBC WITH DIFFERENTIAL/PLATELET
BASOS PCT: 1 %
Basophils Absolute: 56 cells/uL (ref 0–200)
Eosinophils Absolute: 448 cells/uL (ref 15–500)
Eosinophils Relative: 8 %
HEMATOCRIT: 36.5 % — AB (ref 38.5–50.0)
HEMOGLOBIN: 11.4 g/dL — AB (ref 13.2–17.1)
LYMPHS ABS: 2072 {cells}/uL (ref 850–3900)
Lymphocytes Relative: 37 %
MCH: 26 pg — ABNORMAL LOW (ref 27.0–33.0)
MCHC: 31.2 g/dL — ABNORMAL LOW (ref 32.0–36.0)
MCV: 83.3 fL (ref 80.0–100.0)
MONO ABS: 504 {cells}/uL (ref 200–950)
MPV: 10.5 fL (ref 7.5–12.5)
Monocytes Relative: 9 %
Neutro Abs: 2520 cells/uL (ref 1500–7800)
Neutrophils Relative %: 45 %
Platelets: 260 10*3/uL (ref 140–400)
RBC: 4.38 MIL/uL (ref 4.20–5.80)
RDW: 15.5 % — AB (ref 11.0–15.0)
WBC: 5.6 10*3/uL (ref 4.0–10.5)

## 2016-05-02 LAB — FERRITIN: Ferritin: 13 ng/mL — ABNORMAL LOW (ref 20–380)

## 2016-05-02 MED ORDER — TESTOSTERONE 50 MG/5GM (1%) TD GEL: TRANSDERMAL | Status: DC

## 2016-05-02 NOTE — Telephone Encounter (Signed)
CALLED [facility] IN AND CALLED PT LEFT MESSAGE TO PLEASE MAKE APPOINTMENT TO HAVE BLOOD WORK DONE

## 2016-05-02 NOTE — Telephone Encounter (Signed)
Don't let him run out but he needs to come in for blood work

## 2016-05-02 NOTE — Telephone Encounter (Signed)
Pt coming in today for testosterone lab. He would like to have iron level checked as well since iron levels were off per a little while back per another doctor. Is this ok?

## 2016-05-02 NOTE — Telephone Encounter (Signed)
Is this okay to refill? 

## 2016-05-03 LAB — TESTOSTERONE: Testosterone: 214 ng/dL — ABNORMAL LOW (ref 250–827)

## 2016-05-03 NOTE — Telephone Encounter (Signed)
His testosterone level is low. Recommended he apply the medication to his lateral chest area as well as thigh and avoid using it on his shoulders to see if that will help with better absorption. He's had difficulty with the iron causing STOMACH. Recommend he take a multivitamin with iron and see how he does down the road. His  Ferritin level is better.

## 2016-05-07 ENCOUNTER — Telehealth: Payer: Self-pay | Admitting: Family Medicine

## 2016-05-10 NOTE — Telephone Encounter (Signed)
Pt called to check on P.A. And also wanted to know why he only gets 1 refill at a time when he used to get 6 months at a time.  ADvised waiting on P.a. & once approved will refer to Dr. Redmond School for refills.

## 2016-05-10 NOTE — Telephone Encounter (Signed)
Called ins company [facility] t# [phone] & states was denied, due to not receiving lab value,  I explained that I did send lab value.  She claims it's not on the form.  Faxed labs to t# [phone] PA# [id number]

## 2016-05-17 ENCOUNTER — Telehealth: Payer: Self-pay | Admitting: Family Medicine

## 2016-05-17 ENCOUNTER — Other Ambulatory Visit: Payer: Self-pay

## 2016-05-17 DIAGNOSIS — D509 Iron deficiency anemia, unspecified: Secondary | ICD-10-CM

## 2016-05-17 NOTE — Telephone Encounter (Signed)
Have him come in for a CBC, iron and iron binding capacity

## 2016-05-17 NOTE — Telephone Encounter (Signed)
His previous number was 11.4. This one was 11.1. He needs to make sure he is on a multivitamin with iron and have him recheck with [place] in about 1 month.

## 2016-05-17 NOTE — Telephone Encounter (Signed)
Orders have been put in.

## 2016-05-17 NOTE — Telephone Encounter (Signed)
He is taking a multi vit with 40% of daily  iron and 135 mg  Iron that Dr.Guessner gave him that is why he is concerned

## 2016-05-17 NOTE — Telephone Encounter (Signed)
Pt came in and stated that he tried to give blood today and was turned down because his hemoglobin was too low. [employer] provided him a paper with the level listed. I am sending back for review. Pt can be reached at [phone]. Pt wanted you to know that he is taking iron that Dr. Carlean Purl recommended. He also takes a multi-vitamin.

## 2016-05-18 ENCOUNTER — Other Ambulatory Visit: Payer: 59

## 2016-05-18 DIAGNOSIS — D509 Iron deficiency anemia, unspecified: Secondary | ICD-10-CM

## 2016-05-18 LAB — CBC WITH DIFFERENTIAL/PLATELET
BASOS ABS: 0 {cells}/uL (ref 0–200)
Basophils Relative: 0 %
EOS ABS: 460 {cells}/uL (ref 15–500)
Eosinophils Relative: 10 %
HCT: 39.9 % (ref 38.5–50.0)
Hemoglobin: 12.7 g/dL — ABNORMAL LOW (ref 13.2–17.1)
LYMPHS PCT: 31 %
Lymphs Abs: 1426 cells/uL (ref 850–3900)
MCH: 27.4 pg (ref 27.0–33.0)
MCHC: 31.8 g/dL — ABNORMAL LOW (ref 32.0–36.0)
MCV: 86 fL (ref 80.0–100.0)
MONOS PCT: 7 %
MPV: 10.2 fL (ref 7.5–12.5)
Monocytes Absolute: 322 cells/uL (ref 200–950)
NEUTROS PCT: 52 %
Neutro Abs: 2392 cells/uL (ref 1500–7800)
PLATELETS: 232 10*3/uL (ref 140–400)
RBC: 4.64 MIL/uL (ref 4.20–5.80)
RDW: 16.5 % — AB (ref 11.0–15.0)
WBC: 4.6 10*3/uL (ref 4.0–10.5)

## 2016-05-18 LAB — IRON AND TIBC
%SAT: 34 % (ref 15–60)
Iron: 113 ug/dL (ref 50–180)
TIBC: 336 ug/dL (ref 250–425)
UIBC: 223 ug/dL (ref 125–400)

## 2016-05-18 LAB — FERRITIN: Ferritin: 24 ng/mL (ref 20–380)

## 2016-05-24 ENCOUNTER — Other Ambulatory Visit: Payer: Self-pay | Admitting: Family Medicine

## 2016-05-24 ENCOUNTER — Telehealth: Payer: Self-pay | Admitting: Family Medicine

## 2016-05-24 NOTE — Telephone Encounter (Signed)
Called [facility] and they state they didn't receive the fax so after long discussion with appeals dept and got it approved til 05/24/17, they will fax letter. Called pharmacy & it went thru & called pt & informed approved.

## 2016-05-24 NOTE — Telephone Encounter (Signed)
PA Approved pt informed.

## 2016-05-24 NOTE — Telephone Encounter (Signed)
Please call re PA for testim

## 2016-05-25 ENCOUNTER — Other Ambulatory Visit: Payer: Self-pay

## 2016-05-25 ENCOUNTER — Telehealth: Payer: Self-pay | Admitting: Family Medicine

## 2016-05-25 MED ORDER — TESTOSTERONE 50 MG/5GM (1%) TD GEL: TRANSDERMAL | Status: DC

## 2016-05-25 NOTE — Telephone Encounter (Signed)
Okay 

## 2016-05-25 NOTE — Telephone Encounter (Signed)
Called in.

## 2016-05-25 NOTE — Telephone Encounter (Signed)
Pt is now approved for Testim, he would like 6 months of refills on Testim

## 2016-08-09 ENCOUNTER — Ambulatory Visit (INDEPENDENT_AMBULATORY_CARE_PROVIDER_SITE_OTHER): Payer: 59 | Admitting: Family Medicine

## 2016-08-09 ENCOUNTER — Ambulatory Visit
Admission: RE | Admit: 2016-08-09 | Discharge: 2016-08-09 | Disposition: A | Discharge status: Home / Self Care | Payer: 59 | Source: Ambulatory Visit | Attending: Family Medicine | Admitting: Family Medicine

## 2016-08-09 ENCOUNTER — Encounter: Payer: Self-pay | Admitting: Family Medicine

## 2016-08-09 VITALS — BP 160/100 | HR 66 | Wt 239.0 lb

## 2016-08-09 DIAGNOSIS — J4520 Mild intermittent asthma, uncomplicated: Secondary | ICD-10-CM | POA: Diagnosis not present

## 2016-08-09 DIAGNOSIS — J309 Allergic rhinitis, unspecified: Secondary | ICD-10-CM | POA: Diagnosis not present

## 2016-08-09 DIAGNOSIS — E291 Testicular hypofunction: Secondary | ICD-10-CM | POA: Diagnosis not present

## 2016-08-09 DIAGNOSIS — D649 Anemia, unspecified: Secondary | ICD-10-CM

## 2016-08-09 MED ORDER — FLUTICASONE FUROATE 200 MCG/ACT IN AEPB: 1.0000 | INHALATION_SPRAY | Freq: Every day | RESPIRATORY_TRACT | 5 refills | Status: DC

## 2016-08-09 NOTE — Progress Notes (Signed)
   Subjective:    Patient ID: [name], male    DOB: 09/01/1956, 60 y.o.   MRN: [id number]  HPI he has a history of allergic rhinitis and occasional difficulty with asthma during the allergy season. He states that over the last month he has noted increasing difficulty with wheezing a slightly productive cough and headache but no fever, chills, chest congestion. He has been using his albuterol now every few hours. He also has a previous history of anemia with a workup that was negative except for tubular adenoma. Apparently no etiology of the anemia. He also continues on his testosterone and is using it topically mainly on his torso and inner thighs. His previous testosterone level was slightly low.  Review of Systems     Objective:   Physical Exam Alert and in no distress. Tympanic membranes and canals are normal. Pharyngeal area is normal. Neck is supple without adenopathy or thyromegaly. Cardiac exam shows a regular sinus rhythm without murmurs or gallops. Lungs show expiratory wheezing. Chest x-ray is negative.        Assessment & Plan:  Allergic rhinitis, unspecified allergic rhinitis type  Anemia, unspecified anemia type - Plan: CBC with Differential/Platelet  Asthma, mild intermittent, uncomplicated - Plan: CBC with Differential/Platelet, DG Chest 2 View, Fluticasone Furoate (ARNUITY ELLIPTA) 200 MCG/ACT AEPB  Hypogonadism male He is to call me in approximately 2 weeks to let me know how he is doing on the steroid inhaler. He is to follow-up with me in the near future for complete examination. Weight on testosterone and adjust accordingly. His blood pressure today is slightly elevated but do not feel the need to make any decisions today.

## 2016-08-10 ENCOUNTER — Other Ambulatory Visit: Payer: Self-pay

## 2016-08-10 LAB — TESTOSTERONE: TESTOSTERONE: 286 ng/dL (ref 250–827)

## 2016-08-10 MED ORDER — TESTOSTERONE 50 MG/5GM (1%) TD GEL: TRANSDERMAL | 1 refills | Status: DC

## 2016-08-11 ENCOUNTER — Encounter: Payer: Self-pay | Admitting: Family Medicine

## 2016-08-11 LAB — CBC WITH DIFFERENTIAL/PLATELET
BASOS ABS: 0 {cells}/uL (ref 0–200)
BASOS PCT: 0 %
EOS ABS: 496 {cells}/uL (ref 15–500)
Eosinophils Relative: 8 %
HEMATOCRIT: 46.9 % (ref 38.5–50.0)
HEMOGLOBIN: 15 g/dL (ref 13.2–17.1)
Lymphocytes Relative: 34 %
Lymphs Abs: 2108 cells/uL (ref 850–3900)
MCH: 28.9 pg (ref 27.0–33.0)
MCHC: 32 g/dL (ref 32.0–36.0)
MCV: 90.4 fL (ref 80.0–100.0)
MONO ABS: 372 {cells}/uL (ref 200–950)
MPV: 10.9 fL (ref 7.5–12.5)
Monocytes Relative: 6 %
NEUTROS ABS: 3224 {cells}/uL (ref 1500–7800)
Neutrophils Relative %: 52 %
Platelets: 224 10*3/uL (ref 140–400)
RBC: 5.19 MIL/uL (ref 4.20–5.80)
RDW: 14.8 % (ref 11.0–15.0)
WBC: 6.2 10*3/uL (ref 4.0–10.5)

## 2016-09-02 ENCOUNTER — Other Ambulatory Visit: Payer: Self-pay | Admitting: *Deleted

## 2016-09-02 ENCOUNTER — Ambulatory Visit (INDEPENDENT_AMBULATORY_CARE_PROVIDER_SITE_OTHER): Payer: 59 | Admitting: Family Medicine

## 2016-09-02 ENCOUNTER — Encounter: Payer: Self-pay | Admitting: Family Medicine

## 2016-09-02 VITALS — BP 152/100 | HR 56 | Ht 73.0 in | Wt 240.0 lb

## 2016-09-02 DIAGNOSIS — Z0000 Encounter for general adult medical examination without abnormal findings: Secondary | ICD-10-CM | POA: Diagnosis not present

## 2016-09-02 DIAGNOSIS — Z8679 Personal history of other diseases of the circulatory system: Secondary | ICD-10-CM

## 2016-09-02 DIAGNOSIS — G4733 Obstructive sleep apnea (adult) (pediatric): Secondary | ICD-10-CM

## 2016-09-02 DIAGNOSIS — E669 Obesity, unspecified: Secondary | ICD-10-CM

## 2016-09-02 DIAGNOSIS — E291 Testicular hypofunction: Secondary | ICD-10-CM

## 2016-09-02 DIAGNOSIS — B009 Herpesviral infection, unspecified: Secondary | ICD-10-CM

## 2016-09-02 DIAGNOSIS — Z9884 Bariatric surgery status: Secondary | ICD-10-CM | POA: Diagnosis not present

## 2016-09-02 DIAGNOSIS — K219 Gastro-esophageal reflux disease without esophagitis: Secondary | ICD-10-CM

## 2016-09-02 DIAGNOSIS — Z87891 Personal history of nicotine dependence: Secondary | ICD-10-CM | POA: Diagnosis not present

## 2016-09-02 DIAGNOSIS — J309 Allergic rhinitis, unspecified: Secondary | ICD-10-CM

## 2016-09-02 DIAGNOSIS — Z23 Encounter for immunization: Secondary | ICD-10-CM | POA: Diagnosis not present

## 2016-09-02 DIAGNOSIS — I10 Essential (primary) hypertension: Secondary | ICD-10-CM | POA: Diagnosis not present

## 2016-09-02 DIAGNOSIS — Z8719 Personal history of other diseases of the digestive system: Secondary | ICD-10-CM | POA: Diagnosis not present

## 2016-09-02 LAB — COMPREHENSIVE METABOLIC PANEL
ALK PHOS: 59 U/L (ref 40–115)
ALT: 15 U/L (ref 9–46)
AST: 25 U/L (ref 10–35)
Albumin: 4.2 g/dL (ref 3.6–5.1)
BUN: 12 mg/dL (ref 7–25)
CALCIUM: 9.1 mg/dL (ref 8.6–10.3)
CHLORIDE: 101 mmol/L (ref 98–110)
CO2: 26 mmol/L (ref 20–31)
Creat: 1.09 mg/dL (ref 0.70–1.25)
GLUCOSE: 87 mg/dL (ref 65–99)
POTASSIUM: 4.3 mmol/L (ref 3.5–5.3)
Sodium: 138 mmol/L (ref 135–146)
TOTAL PROTEIN: 7.6 g/dL (ref 6.1–8.1)
Total Bilirubin: 0.7 mg/dL (ref 0.2–1.2)

## 2016-09-02 LAB — LIPID PANEL
CHOL/HDL RATIO: 3.7 ratio (ref <=5.0)
CHOLESTEROL: 196 mg/dL (ref 125–200)
HDL: 53 mg/dL (ref >=40)
LDL Cholesterol: 129 mg/dL (ref <130)
Triglycerides: 69 mg/dL (ref <150)
VLDL: 14 mg/dL (ref <30)

## 2016-09-02 LAB — POCT URINALYSIS DIPSTICK
BILIRUBIN UA: NEGATIVE
Blood, UA: NEGATIVE
Glucose, UA: NEGATIVE
KETONES UA: NEGATIVE
Leukocytes, UA: NEGATIVE
Nitrite, UA: NEGATIVE
PH UA: 6
PROTEIN UA: NEGATIVE
SPEC GRAV UA: 1.025
Urobilinogen, UA: NEGATIVE

## 2016-09-02 LAB — PSA: PSA: 0.4 ng/mL (ref <=4.0)

## 2016-09-02 NOTE — Progress Notes (Addendum)
Subjective:    Patient ID: [name], male    DOB: 04-04-1956, 61 y.o.   MRN: [id number]  HPI He is here for a complete examination. He has had difficulty recently with cavities and the dentist thinking it could be related to the steroid that he is using. He also is having another outbreak of herpes on his nose. He states that that usually occurs in that area. He is doing well on his Arnuity inhaler however he is not using it on a daily basis. He does have a history of OSA but has had a significant weight loss and notes no difficulty with excessive sleepiness or snoring. He does have reflux symptoms on occasions this usually related to his eating habits. His allergies are under good control. Review of the record also indicates history of diverticulosis. He also has a very remote history of atrial fibrillation has not had trouble with that in several years. He gets routine follow-up on his lap band surgery. His weight has been stable. He has a previous history of smoking but stopped roughly 25 years ago. He continues on testosterone and has no symptoms from this. He has a previous history of hypertension and still has lisinopril/HCTZ at home. Family and social history as well as health maintenance and immunizations were reviewed   Review of Systems  All other systems reviewed and are negative.      Objective:   Physical Exam BP (!) 152/100 (BP Location: Right Arm, Patient Position: Sitting, Cuff Size: Normal)   Pulse (!) 56   Ht 6\' 1"  (1.854 m)   Wt 240 lb (108.9 kg)   BMI 31.66 kg/m   General Appearance:    Alert, cooperative, no distress, appears stated age  Head:    Normocephalic, without obvious abnormality, atraumatic  Eyes:    PERRL, conjunctiva/corneas clear, EOM's intact, fundi    benign  Ears:    Normal TM's and external ear canals  Nose:   Nares normal, mucosa normal, no drainage or sinus   Tenderness.Reddish 0.5 cm lesion noted on the left tip of the nose   Throat:   Lips,  mucosa, and tongue normal; teeth and gums normal  Neck:   Supple, no lymphadenopathy;  thyroid:  no   enlargement/tenderness/nodules; no carotid   bruit or JVD     Lungs:     Clear to auscultation bilaterally without wheezes, rales or     ronchi; respirations unlabored      Heart:    Regular rate and rhythm, S1 and S2 normal, no murmur, rub   or gallop     Abdomen:     Soft, non-tender, nondistended, normoactive bowel sounds,    no masses, no hepatosplenomegaly  Genitalia:    Normal male external genitalia without lesions.  Testicles are slightly atrophied without masses.  No inguinal hernias.     Extremities:   No clubbing, cyanosis or edema  Pulses:   2+ and symmetric all extremities  Skin:   Skin color, texture, turgor normal, no rashes or lesions  Lymph nodes:   Cervical, supraclavicular, and axillary nodes normal  Neurologic:   CNII-XII intact, normal strength, sensation and gait; reflexes 2+ and symmetric throughout          Psych:   Normal mood, affect, hygiene and grooming.          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 Discussed the use of his inhaler and encouraged  him to use it on a daily basis. Will also start back on his lisinopril/HCTZ since his last few blood pressures have started to creep up in spite of his weight loss. He is to return here in one month for a blood pressure recheck. His reflux is giving him very little difficulty and he usually knows that this is dietary indiscretion that causes it. We'll continue on his acyclovir to help with the nasal lesion.

## 2016-09-02 NOTE — Patient Instructions (Signed)
Start back on your blood pressure medication and come back here in one month for recheck

## 2016-09-03 LAB — TESTOSTERONE: Testosterone: 420 ng/dL (ref 250–827)

## 2016-09-05 ENCOUNTER — Other Ambulatory Visit: Payer: Self-pay

## 2016-09-05 DIAGNOSIS — J4520 Mild intermittent asthma, uncomplicated: Secondary | ICD-10-CM

## 2016-09-05 MED ORDER — ACYCLOVIR 200 MG PO CAPS: ORAL_CAPSULE | ORAL | 3 refills | Status: DC

## 2016-09-05 MED ORDER — FLUTICASONE FUROATE 200 MCG/ACT IN AEPB: 1.0000 | INHALATION_SPRAY | Freq: Every day | RESPIRATORY_TRACT | 5 refills | Status: DC

## 2016-09-26 ENCOUNTER — Encounter: Payer: Self-pay | Admitting: Family Medicine

## 2016-09-26 ENCOUNTER — Telehealth: Payer: Self-pay | Admitting: Internal Medicine

## 2016-09-26 ENCOUNTER — Ambulatory Visit (INDEPENDENT_AMBULATORY_CARE_PROVIDER_SITE_OTHER): Payer: 59 | Admitting: Family Medicine

## 2016-09-26 VITALS — BP 122/84 | HR 74 | Wt 240.0 lb

## 2016-09-26 DIAGNOSIS — I10 Essential (primary) hypertension: Secondary | ICD-10-CM | POA: Diagnosis not present

## 2016-09-26 DIAGNOSIS — Z8719 Personal history of other diseases of the digestive system: Secondary | ICD-10-CM | POA: Diagnosis not present

## 2016-09-26 DIAGNOSIS — K625 Hemorrhage of anus and rectum: Secondary | ICD-10-CM | POA: Diagnosis not present

## 2016-09-26 LAB — CBC WITH DIFFERENTIAL/PLATELET
BASOS ABS: 0 {cells}/uL (ref 0–200)
BASOS PCT: 0 %
EOS ABS: 195 {cells}/uL (ref 15–500)
Eosinophils Relative: 3 %
HEMATOCRIT: 44 % (ref 38.5–50.0)
HEMOGLOBIN: 14.7 g/dL (ref 13.2–17.1)
Lymphocytes Relative: 44 %
Lymphs Abs: 2860 cells/uL (ref 850–3900)
MCH: 29.5 pg (ref 27.0–33.0)
MCHC: 33.4 g/dL (ref 32.0–36.0)
MCV: 88.4 fL (ref 80.0–100.0)
MONO ABS: 455 {cells}/uL (ref 200–950)
MONOS PCT: 7 %
MPV: 10.4 fL (ref 7.5–12.5)
NEUTROS ABS: 2990 {cells}/uL (ref 1500–7800)
Neutrophils Relative %: 46 %
Platelets: 224 10*3/uL (ref 140–400)
RBC: 4.98 MIL/uL (ref 4.20–5.80)
RDW: 13.9 % (ref 11.0–15.0)
WBC: 6.5 10*3/uL (ref 4.0–10.5)

## 2016-09-26 MED ORDER — LISINOPRIL-HYDROCHLOROTHIAZIDE 10-12.5 MG PO TABS: 1.0000 | ORAL_TABLET | Freq: Every day | ORAL | 3 refills | Status: DC

## 2016-09-26 NOTE — Progress Notes (Addendum)
   Subjective:    Patient ID: [name], male    DOB: May 30, 1956, 60 y.o.   MRN: [id number]  HPI He is here for evaluation of breath red rectal bleeding. He had a normal BM this morning and later on in the morning he had the urge to pass gas but instead passed bright red blood. Review of his record indicates he has had previous difficulty with this with a colonoscopy that showed diverticular disease but no obvious bleeding. Presently is having no abdominal pain, nausea, vomiting. While here in the office he had the urge and did note loose bloody stool. Also he started back on his lisinopril/HCTZ and was scheduled for a follow-up visit in 2 weeks.  Review of Systems     Objective:   Physical Exam Alert and in no distress. Blood pressure today is normal.      Assessment & Plan:  Rectal bleeding - Plan: CBC with Differential/Platelet  Essential hypertension - Plan: lisinopril-hydrochlorothiazide (PRINZIDE,ZESTORETIC) 10-12.5 MG tablet  History of diverticulosis - Plan: CBC with Differential/Platelet His case was discussed with Dr. Carlean Purl. He will give the patient a call tomorrow and decide whether admission is needed. Hopefully the bleeding will stop on its own. This was all explained to the patient. Will also continue on his Prinzide. I called on 10 3. He stated that he is having less bleeding. His hemoglobin was 14.7. Recommended that he call Dr. Carlean Purl to let him know.

## 2016-09-26 NOTE — Telephone Encounter (Signed)
Patient in Dr. Lanice Shirts office with 2 episodes of hematochezia today. No other sxs Similar to April 2017 events - colonoscopy then revealed diverticulosis and 2 adenomas   Advised if he has persistent bleeding, other sxs with it then to ED  Will check on CBC and call him tomorrow to f/u and see what next step is

## 2016-09-27 NOTE — Telephone Encounter (Signed)
Patient notified of the results.  He report bleeding has improved.  Seeing it in the stool .  "appears to be stopping".  Denies pain.

## 2016-09-27 NOTE — Telephone Encounter (Signed)
Lab Results  Component Value Date   WBC 6.5 09/26/2016   HGB 14.7 09/26/2016   HCT 44.0 09/26/2016   MCV 88.4 09/26/2016   PLT 224 09/26/2016    Hgb was normal We will call him and get an update on the bleeding and triage follow-up

## 2016-09-28 NOTE — Telephone Encounter (Signed)
Let him know that based upon what we know would think he has had bleeding from the diverticulosis. If severe and not stopping ever go to ED.  If not yet stopped let me know.  If keeps happening would repeat a colonoscopy to make sure something else not bleeding

## 2016-09-28 NOTE — Telephone Encounter (Signed)
Patient notified.  He reports all bleeding has stopped at this time.

## 2016-10-05 ENCOUNTER — Ambulatory Visit: Payer: 59 | Admitting: Family Medicine

## 2016-10-10 ENCOUNTER — Encounter (HOSPITAL_BASED_OUTPATIENT_CLINIC_OR_DEPARTMENT_OTHER): Payer: 59

## 2016-10-31 ENCOUNTER — Ambulatory Visit (HOSPITAL_BASED_OUTPATIENT_CLINIC_OR_DEPARTMENT_OTHER): Payer: 59 | Attending: Family Medicine | Admitting: Internal Medicine

## 2016-10-31 DIAGNOSIS — R0683 Snoring: Secondary | ICD-10-CM | POA: Insufficient documentation

## 2016-10-31 DIAGNOSIS — G4733 Obstructive sleep apnea (adult) (pediatric): Secondary | ICD-10-CM | POA: Diagnosis present

## 2016-11-01 ENCOUNTER — Other Ambulatory Visit (HOSPITAL_BASED_OUTPATIENT_CLINIC_OR_DEPARTMENT_OTHER): Payer: Self-pay

## 2016-11-01 DIAGNOSIS — G4733 Obstructive sleep apnea (adult) (pediatric): Secondary | ICD-10-CM

## 2016-11-02 ENCOUNTER — Other Ambulatory Visit: Payer: Self-pay | Admitting: Family Medicine

## 2016-11-03 DIAGNOSIS — G4733 Obstructive sleep apnea (adult) (pediatric): Secondary | ICD-10-CM

## 2016-11-03 NOTE — Progress Notes (Signed)
I informed him of the results of the study which indicates OSA but at a very low level. Presently he is not using the CPAP and I recommended continued weight loss and sleeping more on his side. He rarely if ever drinks.

## 2016-11-03 NOTE — Telephone Encounter (Signed)
Renew for 3 months

## 2016-11-03 NOTE — Telephone Encounter (Signed)
Called in med per [employer]

## 2016-11-03 NOTE — Procedures (Signed)
   Patient Name: [name], [place] Date: 10/31/2016 Gender: Male D.O.B: 1956/06/16 Age (years): 30 Referring Provider: Denita Lung Height (inches): 49 Interpreting Physician: Baird Lyons MD, ABSM Weight (lbs): 239 RPSGT: Jacolyn Reedy BMI: 32 MRN: [id number] Neck Size: 17.00 CLINICAL INFORMATION Sleep Study Type: unattended HST   Indication for sleep study: OSA   Epworth Sleepiness Score: 5 SLEEP STUDY TECHNIQUE A multi-channel overnight portable sleep study was performed. The channels recorded were: nasal airflow, thoracic respiratory movement, and oxygen saturation with a pulse oximetry. Snoring was also monitored.  MEDICATIONS Patient self administered medications include: none reported.  SLEEP ARCHITECTURE Patient was studied for 407.0 minutes. The sleep efficiency was 98.8 % and the patient was supine for 49.3%. The arousal index was 0.0 per hour.  RESPIRATORY PARAMETERS The overall AHI was 8.1 per hour, with a central apnea index of 0.0 per hour. The oxygen nadir was 79% during sleep, Mean saturation 91%.   CARDIAC DATA Mean heart rate during sleep was 61.9 bpm.  IMPRESSIONS - Mild obstructive sleep apnea occurred during this study (AHI = 8.1/h). - No significant central sleep apnea occurred during this study (CAI = 0.0/h). - Oxygen desaturation was noted during this study (Min O2 = 79%). - Patient snored.  DIAGNOSIS - Obstructive Sleep Apnea (327.23 [G47.33 ICD-10])  RECOMMENDATIONS - Return to provider to discuss management options for mild OSA, possibly including a fitted oral appliance or CPAP. - Positional therapy avoiding supine position during sleep. - Avoid alcohol, sedatives and other CNS depressants that may worsen sleep apnea and disrupt normal sleep architecture. - Sleep hygiene should be reviewed to assess factors that may improve sleep quality. - Weight management and regular exercise should be initiated or  continued.  [Electronically signed] 11/03/2016 12:52 PM  Baird Lyons MD, [place], American Board of Sleep Medicine   NPI: [id number]  [place], [facility]  ELECTRONICALLY SIGNED ON:  11/03/2016, 12:48 PM [place] PH: [phone]   FX: [phone] [place]

## 2016-12-29 ENCOUNTER — Other Ambulatory Visit: Payer: Self-pay | Admitting: Family Medicine

## 2016-12-29 NOTE — Telephone Encounter (Signed)
Is this okay to refill? 

## 2017-02-01 ENCOUNTER — Other Ambulatory Visit: Payer: Self-pay | Admitting: Family Medicine

## 2017-02-02 NOTE — Telephone Encounter (Signed)
Is this okay to refill? 

## 2017-03-14 ENCOUNTER — Other Ambulatory Visit: Payer: Self-pay | Admitting: Family Medicine

## 2017-03-14 DIAGNOSIS — J4520 Mild intermittent asthma, uncomplicated: Secondary | ICD-10-CM

## 2017-03-14 NOTE — Telephone Encounter (Signed)
Is this okay to refill? 

## 2017-05-31 ENCOUNTER — Ambulatory Visit (INDEPENDENT_AMBULATORY_CARE_PROVIDER_SITE_OTHER): Payer: 59 | Admitting: Medical

## 2017-05-31 ENCOUNTER — Encounter: Payer: Self-pay | Admitting: Medical

## 2017-05-31 VITALS — BP 132/74 | HR 85 | Temp 98.2°F | Wt 242.6 lb

## 2017-05-31 DIAGNOSIS — M25561 Pain in right knee: Secondary | ICD-10-CM | POA: Diagnosis not present

## 2017-05-31 DIAGNOSIS — M79604 Pain in right leg: Secondary | ICD-10-CM | POA: Insufficient documentation

## 2017-05-31 DIAGNOSIS — K143 Hypertrophy of tongue papillae: Secondary | ICD-10-CM | POA: Diagnosis not present

## 2017-05-31 DIAGNOSIS — W57XXXA Bitten or stung by nonvenomous insect and other nonvenomous arthropods, initial encounter: Secondary | ICD-10-CM | POA: Diagnosis not present

## 2017-05-31 NOTE — Progress Notes (Signed)
Subjective: Chief Complaint  Patient presents with  . Leg Pain    leg pain in rt side started about week ago, think he was bitten    Here for c/o right leg pain x 1 week.  Didn't have any injury or trauma.  Just started getting pain in right knee but also rest of leg.    He notes 3 weeks ago had a tick he pulled off in the shower.   He has been having some headaches and dry mouth, having some orange tongue.   dentist said he wasn't drinking enough water.  No other aggravating or relieving factors. No other complaint.   Past Medical History:  Diagnosis Date  . Allergy    RHINITIS  . Asthma   . Atrial fibrillation ([place])   . Diverticular hemorrhage   . Dyslipidemia   . GERD (gastroesophageal reflux disease)   . History of colonic polyps   . Hypertension   . Hypogonadism male   . Obesity   . Vitamin D deficiency    Current Outpatient Prescriptions on File Prior to Visit  Medication Sig Dispense Refill  . acyclovir (ZOVIRAX) 200 MG capsule 2 pills twice per day for prevention 360 capsule 3  . ARNUITY ELLIPTA 200 MCG/ACT AEPB INHALE 1 PUFF INTO THE LUNGS DAILY 1 each 5  . aspirin 81 MG tablet Take 81 mg by mouth daily.      Marland Kitchen CIALIS 5 MG tablet TAKE 1 TABLET BY MOUTH EVERY DAY AS NEEDED FOR ED 30 tablet 5  . levocetirizine (XYZAL) 5 MG tablet Take 5 mg by mouth every evening.    Marland Kitchen lisinopril-hydrochlorothiazide (PRINZIDE,ZESTORETIC) 10-12.5 MG tablet Take 1 tablet by mouth daily. 90 tablet 3  . VENTOLIN HFA 108 (90 Base) MCG/ACT inhaler INHALE 2 PUFFS INTO LUNGS EVERY 6 HOURS AS NEEDED FOR WHEEZING 54 g 1  . TESTIM 50 MG/5GM (1%) GEL APPLY 10 GRAMS ON SKIN EVERY DAY (Patient not taking: Reported on 05/31/2017) 300 g 2   No current facility-administered medications on file prior to visit.    Past Surgical History:  Procedure Laterality Date  . COLONOSCOPY    . ENDOVENOUS ABLATION SAPHENOUS VEIN W/ LASER  02/2011   right leg  . LAPAROSCOPIC GASTRIC BANDING  2008   EARLE MD  .  partial thryoidectomy      ROS as in subjective   Objective: BP 132/74   Pulse 85   Temp 98.2 F (36.8 C)   Wt 242 lb 9.6 oz (110 kg)   SpO2 98%   BMI 32.01 kg/m   General appearance: alert, no distress, WD/WN No rash, no bruising or redness HEENT: normocephalic, sclerae anicteric, TMs pearly, nares patent, no discharge or erythema, pharynx normal Oral cavity: MMM, no lesions Neck: supple, no lymphadenopathy, no thyromegaly, no masses Right leg with mild tenderness along medial biceps femoris tendon, otherwise no deformity, no other tenderness, no swelling range of motion WNL.  Rest of LE exam unremarkable Pulses: 2+ symmetric, upper and lower extremities, normal cap refill Neuro: legs with normal strength, sensation    Assessment: Encounter Diagnoses  Name Primary?  . Right leg pain Yes  . Acute pain of right knee   . Tick bite, initial encounter   . Brown tongue       Plan: Etiology unclear, but possible hamstring strain given exam findings.   Advised stretching, heat, ROM activity, and OTC analgesic.  If not improving in the next 1.5 weeks, let us know.   Lab  today for lyme, but doubt lyme disease.  discussed possible tick borne illness and symptoms.   Advised he drink plenty of water daily.  F/u pending labs.   Kaevion was seen today for leg pain.  Diagnoses and all orders for this visit:  Right leg pain -     B. Burgdorfi Antibodies  Acute pain of right knee  Tick bite, initial encounter -     B. Burgdorfi Antibodies  Brown tongue -     Hemoglobin A1c

## 2017-06-01 LAB — HEMOGLOBIN A1C
Hgb A1c MFr Bld: 5.6 % (ref <5.7)
Mean Plasma Glucose: 114 mg/dL

## 2017-06-01 LAB — LYME AB/WESTERN BLOT REFLEX: B burgdorferi Ab IgG+IgM: <0.9 Index (ref <0.90)

## 2017-09-18 ENCOUNTER — Ambulatory Visit (INDEPENDENT_AMBULATORY_CARE_PROVIDER_SITE_OTHER): Payer: 59 | Admitting: Family Medicine

## 2017-09-18 ENCOUNTER — Encounter: Payer: Self-pay | Admitting: Family Medicine

## 2017-09-18 VITALS — BP 120/80 | HR 58 | Ht 73.5 in | Wt 240.2 lb

## 2017-09-18 DIAGNOSIS — Z0000 Encounter for general adult medical examination without abnormal findings: Secondary | ICD-10-CM | POA: Diagnosis not present

## 2017-09-18 DIAGNOSIS — Z8679 Personal history of other diseases of the circulatory system: Secondary | ICD-10-CM

## 2017-09-18 DIAGNOSIS — Z86010 Personal history of colonic polyps: Secondary | ICD-10-CM | POA: Insufficient documentation

## 2017-09-18 DIAGNOSIS — Z860101 Personal history of adenomatous and serrated colon polyps: Secondary | ICD-10-CM | POA: Insufficient documentation

## 2017-09-18 DIAGNOSIS — J309 Allergic rhinitis, unspecified: Secondary | ICD-10-CM

## 2017-09-18 DIAGNOSIS — E6609 Other obesity due to excess calories: Secondary | ICD-10-CM | POA: Diagnosis not present

## 2017-09-18 DIAGNOSIS — Z8719 Personal history of other diseases of the digestive system: Secondary | ICD-10-CM | POA: Diagnosis not present

## 2017-09-18 DIAGNOSIS — Z8619 Personal history of other infectious and parasitic diseases: Secondary | ICD-10-CM

## 2017-09-18 DIAGNOSIS — E291 Testicular hypofunction: Secondary | ICD-10-CM | POA: Diagnosis not present

## 2017-09-18 DIAGNOSIS — Z87891 Personal history of nicotine dependence: Secondary | ICD-10-CM | POA: Insufficient documentation

## 2017-09-18 DIAGNOSIS — Z6831 Body mass index (BMI) 31.0-31.9, adult: Secondary | ICD-10-CM

## 2017-09-18 DIAGNOSIS — Z9884 Bariatric surgery status: Secondary | ICD-10-CM | POA: Diagnosis not present

## 2017-09-18 DIAGNOSIS — Z23 Encounter for immunization: Secondary | ICD-10-CM | POA: Diagnosis not present

## 2017-09-18 DIAGNOSIS — G4733 Obstructive sleep apnea (adult) (pediatric): Secondary | ICD-10-CM | POA: Diagnosis not present

## 2017-09-18 DIAGNOSIS — K143 Hypertrophy of tongue papillae: Secondary | ICD-10-CM | POA: Diagnosis not present

## 2017-09-18 DIAGNOSIS — I10 Essential (primary) hypertension: Secondary | ICD-10-CM | POA: Diagnosis not present

## 2017-09-18 DIAGNOSIS — Z125 Encounter for screening for malignant neoplasm of prostate: Secondary | ICD-10-CM

## 2017-09-18 DIAGNOSIS — J4520 Mild intermittent asthma, uncomplicated: Secondary | ICD-10-CM

## 2017-09-18 LAB — POCT URINALYSIS DIP (PROADVANTAGE DEVICE)
BILIRUBIN UA: NEGATIVE
Blood, UA: NEGATIVE
GLUCOSE UA: NEGATIVE mg/dL
Ketones, POC UA: NEGATIVE mg/dL
LEUKOCYTES UA: NEGATIVE
NITRITE UA: NEGATIVE
PH UA: 6 (ref 5.0–8.0)
Protein Ur, POC: NEGATIVE mg/dL
Specific Gravity, Urine: 1.03
Urobilinogen, Ur: NEGATIVE

## 2017-09-18 MED ORDER — ACYCLOVIR 200 MG PO CAPS: ORAL_CAPSULE | ORAL | 3 refills | Status: DC

## 2017-09-18 MED ORDER — TESTOSTERONE 20.25 MG/ACT (1.62%) TD GEL: 2.0000 | Freq: Every day | TRANSDERMAL | 5 refills | Status: DC

## 2017-09-18 MED ORDER — ALBUTEROL SULFATE HFA 108 (90 BASE) MCG/ACT IN AERS: INHALATION_SPRAY | RESPIRATORY_TRACT | 0 refills | Status: DC

## 2017-09-18 MED ORDER — LISINOPRIL-HYDROCHLOROTHIAZIDE 10-12.5 MG PO TABS: 1.0000 | ORAL_TABLET | Freq: Every day | ORAL | 3 refills | Status: DC

## 2017-09-18 NOTE — Patient Instructions (Addendum)
Stop the Arnuity and use the rescue inhaler but if you need it more than twice a week during the day or twice a month at night call me Brush her tongue and also possibly use half water half peroxide. Consider using a probiotic as well

## 2017-09-18 NOTE — Progress Notes (Signed)
Subjective:    Patient ID: [name], male    DOB: April 06, 1956, 61 y.o.   MRN: [id number]  HPI He is here for complete examination. He does have underlying OSA however presently is not on CPAP. He was tested and no longer had as much difficulty. His weight is doing fairly well. He has had LAP-BAND surgery. He has had a colonoscopy and does have diverticulosis and also has a history of colonic polyps. He will follow-up with GI based on that. His allergies are under good control. As been using his steroid inhaler once or twice per week and has not been using the albuterol  He also complains of difficulty with oranges Brown tongue. He has been brushing his tongue when he brushes his teeth. He is no longer using the pterygoid inhaler. He apparently has had a lot of dental problems recently and does also complain of dry mouth he continues on his blood pressure medications. He has not had any more evidence or problems with atrial fibrillation. He stopped using his testosterone thinking that it was causing an outbreak of herpes review his record also indicates a previous history of smoking. Work and home life are going well. Family and social history as well as health maintenance and immunizations were reviewed.  Review of Systems  All other systems reviewed and are negative.      Objective:   Physical Exam BP 120/80   Pulse (!) 58   Ht 6' 1.5" (1.867 m)   Wt 240 lb 3.2 oz (109 kg)   SpO2 97%   BMI 31.26 kg/m   General Appearance:    Alert, cooperative, no distress, appears stated age  Head:    Normocephalic, without obvious abnormality, atraumatic  Eyes:    PERRL, conjunctiva/corneas clear, EOM's intact, fundi    benign  Ears:    Normal TM's and external ear canals  Nose:   Nares normal, mucosa normal, no drainage or sinus   tenderness  Throat:   Lips, mucosa, and tongue normal; teeth and gums normal  Neck:   Supple, no lymphadenopathy;  thyroid:  no   enlargement/tenderness/nodules; no  carotid   bruit or JVD     Lungs:     Clear to auscultation bilaterally without wheezes, rales or     ronchi; respirations unlabored      Heart:    Regular rate and rhythm, S1 and S2 normal, no murmur, rub   or gallop     Abdomen:     Soft, non-tender, nondistended, normoactive bowel sounds,    no masses, no hepatosplenomegaly  Genitalia:    Normal male external genitalia without lesions.  Testicle on the right  is atrophied.  No inguinal hernias.  Rectal:    Normal sphincter tone, no masses or tenderness; guaiac negative stool.  Prostate smooth, no nodules, not enlarged.  Extremities:   No clubbing, cyanosis or edema  Pulses:   2+ and symmetric all extremities  Skin:   Skin color, texture, turgor normal, no rashes or lesions  Lymph nodes:   Cervical, supraclavicular, and axillary nodes normal  Neurologic:   CNII-XII intact, normal strength, sensation and gait; reflexes 2+ and symmetric throughout          Psych:   Normal mood, affect, hygiene and grooming.         Assessment & Plan:  Annual physical exam - Plan: POCT Urinalysis DIP (Proadvantage Device), CBC with Differential/Platelet, Comprehensive metabolic panel, Lipid panel, EKG 12-Lead  Need for influenza  vaccination - Plan: Flu Vaccine QUAD 6+ mos PF IM (Fluarix Quad PF)  Obstructive sleep apnea  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  Class 1 obesity due to excess calories without serious comorbidity with body mass index (BMI) of 31.0 to 31.9 in adult  History of diverticulosis  History of adenomatous polyp of colon  Allergic rhinitis, unspecified seasonality, unspecified trigger  Brown tongue  Essential hypertension - Plan: EKG 12-Lead, lisinopril-hydrochlorothiazide (PRINZIDE,ZESTORETIC) 10-12.5 MG tablet  History of atrial fibrillation - Plan: EKG 12-Lead  Hypogonadism male - Plan: Testosterone, Testosterone (ANDROGEL PUMP) 20.25 MG/ACT (1.62%) GEL  Former smoker, stopped smoking in distant past  Screening for prostate  cancer - Plan: PSA  Mild intermittent asthma without complication - Plan: albuterol (VENTOLIN HFA) 108 (90 Base) MCG/ACT inhaler  Need for shingles vaccine - Plan: Varicella-zoster vaccine IM (Shingrix)  History of herpes labialis - Plan: acyclovir (ZOVIRAX) 200 MG capsule Recommend he stop the steroid inhaler and keep me informed as to the need for the albuterol. His immunizations were updated. He will follow-up with GI based on colonic polyp. Continue on his allergy medications. Recommend he discuss his tongue with the dentist but also recommend half water and peroxide as well as possibly a probiotic he will continue on his blood pressure medications. I will place him on AndroGel. Explained that the symptoms he is blaming on the testosterone are probably not related to that.

## 2017-09-19 LAB — LIPID PANEL
CHOL/HDL RATIO: 4.4 (calc) (ref <5.0)
CHOLESTEROL: 202 mg/dL — AB (ref <200)
HDL: 46 mg/dL (ref >40)
LDL Cholesterol (Calc): 135 mg/dL (calc) — ABNORMAL HIGH
Non-HDL Cholesterol (Calc): 156 mg/dL (calc) — ABNORMAL HIGH (ref <130)
Triglycerides: 107 mg/dL (ref <150)

## 2017-09-19 LAB — CBC WITH DIFFERENTIAL/PLATELET
BASOS PCT: 0.7 %
Basophils Absolute: 41 cells/uL (ref 0–200)
EOS ABS: 209 {cells}/uL (ref 15–500)
Eosinophils Relative: 3.6 %
HEMATOCRIT: 42.5 % (ref 38.5–50.0)
Hemoglobin: 14.4 g/dL (ref 13.2–17.1)
Lymphs Abs: 2123 cells/uL (ref 850–3900)
MCH: 29.8 pg (ref 27.0–33.0)
MCHC: 33.9 g/dL (ref 32.0–36.0)
MCV: 87.8 fL (ref 80.0–100.0)
MONOS PCT: 7.8 %
MPV: 10.9 fL (ref 7.5–12.5)
Neutro Abs: 2975 cells/uL (ref 1500–7800)
Neutrophils Relative %: 51.3 %
Platelets: 210 10*3/uL (ref 140–400)
RBC: 4.84 10*6/uL (ref 4.20–5.80)
RDW: 12.3 % (ref 11.0–15.0)
TOTAL LYMPHOCYTE: 36.6 %
WBC mixed population: 452 cells/uL (ref 200–950)
WBC: 5.8 10*3/uL (ref 3.8–10.8)

## 2017-09-19 LAB — COMPREHENSIVE METABOLIC PANEL
AG RATIO: 1.3 (calc) (ref 1.0–2.5)
ALBUMIN MSPROF: 3.9 g/dL (ref 3.6–5.1)
ALT: 11 U/L (ref 9–46)
AST: 20 U/L (ref 10–35)
Alkaline phosphatase (APISO): 54 U/L (ref 40–115)
BILIRUBIN TOTAL: 0.6 mg/dL (ref 0.2–1.2)
BUN: 14 mg/dL (ref 7–25)
CALCIUM: 8.9 mg/dL (ref 8.6–10.3)
CHLORIDE: 100 mmol/L (ref 98–110)
CO2: 27 mmol/L (ref 20–32)
Creat: 1.06 mg/dL (ref 0.70–1.25)
GLOBULIN: 3 g/dL (ref 1.9–3.7)
Glucose, Bld: 94 mg/dL (ref 65–99)
POTASSIUM: 4.1 mmol/L (ref 3.5–5.3)
Sodium: 138 mmol/L (ref 135–146)
Total Protein: 6.9 g/dL (ref 6.1–8.1)

## 2017-09-19 LAB — TESTOSTERONE: TESTOSTERONE: 293 ng/dL (ref 250–827)

## 2017-09-19 LAB — PSA: PSA: 0.4 ng/mL (ref <=4.0)

## 2017-12-05 ENCOUNTER — Telehealth: Payer: Self-pay | Admitting: Family Medicine

## 2017-12-05 NOTE — Telephone Encounter (Signed)
P.A. CIALIS  °

## 2017-12-09 NOTE — Telephone Encounter (Signed)
P.A. Cialis denied, plan only allows #3 tablets for one month, unless pt has BPH.

## 2017-12-10 NOTE — Telephone Encounter (Signed)
Let him know thatCall in 3 with as needed refill

## 2017-12-11 NOTE — Telephone Encounter (Signed)
Pt schedule for 12/12/2017 to discuss this.

## 2017-12-11 NOTE — Telephone Encounter (Signed)
Have him schedule an appointment so we can follow-up on this so he can continue to use his Cialis

## 2017-12-11 NOTE — Telephone Encounter (Signed)
Spoke with pt he reports that for 2-3 years he has been taking Cialis daily. Victorino December

## 2017-12-12 ENCOUNTER — Ambulatory Visit: Payer: 59 | Admitting: Family Medicine

## 2017-12-12 ENCOUNTER — Encounter: Payer: Self-pay | Admitting: Family Medicine

## 2017-12-12 VITALS — BP 118/82 | HR 61 | Ht 73.0 in | Wt 241.6 lb

## 2017-12-12 DIAGNOSIS — E291 Testicular hypofunction: Secondary | ICD-10-CM | POA: Diagnosis not present

## 2017-12-12 DIAGNOSIS — N529 Male erectile dysfunction, unspecified: Secondary | ICD-10-CM | POA: Insufficient documentation

## 2017-12-12 NOTE — Progress Notes (Signed)
   Subjective:    Patient ID: [name], male    DOB: Mar 25, 1956, 61 y.o.   MRN: [id number]  HPI He is here for a consult concerning++++++++++++++++++++++++                                         Review of Systems     Objective:   Physical Exam        Assessment & Plan:  Hypogonadism male  Erectile dysfunction, unspecified erectile dysfunction type

## 2017-12-12 NOTE — Progress Notes (Signed)
   Subjective:    Patient ID: [name], male    DOB: 1956/06/06, 61 y.o.   MRN: [id number]  HPI He is here for consult concerning Cialis.  He has been on Cialis 5 mg for several years mainly to help with erectile dysfunction issues.  His insurance will no longer cover this unless he has BPH symptoms.  He also has an underlying history of hypogonadism however has not been on testosterone in well over a year.  He states that he thought the present testosterone replacement of testing was causing a recurrent outbreak of his herpes that he would get on his nose.  He has no urinary symptoms of hesitancy, decreased stream or feeling of incomplete emptying.   Review of Systems     Objective:   Physical Exam  Alert and in no distress otherwise not examined      Assessment & Plan:  Hypogonadism male  Erectile dysfunction, unspecified erectile dysfunction type  I explained that his insurance will no longer cover Cialis unless he has BPH symptoms which he does not.  Discussed the use of other ED medications.  He does state that since he is no longer on the testosterone, his interest has diminished.  He will go back on testosterone and recheck that in a month.  We can then readdress the ED issue. I also explained that I could not explain why why his testosterone preparation might of caused the nasal outbreak.  Not sure whether it is truly a viral recurrence or not.

## 2018-02-21 ENCOUNTER — Telehealth: Payer: Self-pay | Admitting: Family Medicine

## 2018-02-21 DIAGNOSIS — E291 Testicular hypofunction: Secondary | ICD-10-CM

## 2018-02-21 NOTE — Telephone Encounter (Signed)
Called pt regarding follow up appt in morning, he states he would like to just have his testosterone drawn and then follow up with Dr. Redmond School afterwards to restart the testosterone prefers Androgel once results are in.  He did not restart the testosterone after the appt 2 months ago.  Please put in order for testosterone as pt is coming in the morning for draw

## 2018-02-22 ENCOUNTER — Ambulatory Visit: Payer: 59 | Admitting: Family Medicine

## 2018-02-22 ENCOUNTER — Other Ambulatory Visit: Payer: 59

## 2018-02-22 DIAGNOSIS — E291 Testicular hypofunction: Secondary | ICD-10-CM

## 2018-02-22 NOTE — Addendum Note (Signed)
Addended by: Denita Lung on: 02/22/2018 08:38 AM   Modules accepted: Orders

## 2018-02-23 LAB — TESTOSTERONE: Testosterone: 303 ng/dL (ref 264–916)

## 2018-02-24 ENCOUNTER — Encounter: Payer: Self-pay | Admitting: Family Medicine

## 2018-02-24 DIAGNOSIS — E291 Testicular hypofunction: Secondary | ICD-10-CM

## 2018-02-27 MED ORDER — TESTOSTERONE 20.25 MG/ACT (1.62%) TD GEL: 2.0000 | Freq: Every day | TRANSDERMAL | 5 refills | Status: DC

## 2018-02-28 ENCOUNTER — Other Ambulatory Visit (INDEPENDENT_AMBULATORY_CARE_PROVIDER_SITE_OTHER): Payer: 59

## 2018-02-28 DIAGNOSIS — Z23 Encounter for immunization: Secondary | ICD-10-CM | POA: Diagnosis not present

## 2018-03-07 ENCOUNTER — Other Ambulatory Visit: Payer: Self-pay | Admitting: Family Medicine

## 2018-03-07 DIAGNOSIS — I10 Essential (primary) hypertension: Secondary | ICD-10-CM

## 2018-03-11 ENCOUNTER — Telehealth: Payer: Self-pay | Admitting: Family Medicine

## 2018-03-11 NOTE — Telephone Encounter (Signed)
P.A. TESTOSTERONE 1.62% GEL PUMP, preferred meds are Testim and Androderm

## 2018-03-13 NOTE — Telephone Encounter (Signed)
Ok at the higher dosing

## 2018-03-13 NOTE — Telephone Encounter (Signed)
P.A. Isabelle Course, pt needs trial of Androderm, can pt be switched?

## 2018-03-16 ENCOUNTER — Telehealth: Payer: Self-pay | Admitting: Family Medicine

## 2018-03-16 DIAGNOSIS — J4520 Mild intermittent asthma, uncomplicated: Secondary | ICD-10-CM

## 2018-03-16 MED ORDER — TESTOSTERONE 4 MG/24HR TD PT24: 1.0000 | MEDICATED_PATCH | Freq: Every day | TRANSDERMAL | 5 refills | Status: DC

## 2018-03-16 MED ORDER — ALBUTEROL SULFATE HFA 108 (90 BASE) MCG/ACT IN AERS: INHALATION_SPRAY | RESPIRATORY_TRACT | 0 refills | Status: DC

## 2018-03-16 NOTE — Telephone Encounter (Signed)
Pt needs refill albuterol (Ventolin HFA) said due to Spring time needs it, wants it at [facility]

## 2018-03-16 NOTE — Telephone Encounter (Signed)
P.A. ANDRODERM completed and approved.  Called pharmacy and went thru for $35, tried discount card and and didn't reduce price any.

## 2018-03-16 NOTE — Telephone Encounter (Signed)
P.A. Isabelle Course pt needs trial of Androderm.  Called in Androderm, but this also required P.A.

## 2018-04-23 ENCOUNTER — Ambulatory Visit: Payer: 59 | Admitting: Family Medicine

## 2018-04-23 ENCOUNTER — Encounter: Payer: Self-pay | Admitting: Family Medicine

## 2018-04-23 VITALS — BP 124/82 | HR 61 | Temp 98.1°F | Ht 72.0 in | Wt 246.0 lb

## 2018-04-23 DIAGNOSIS — E291 Testicular hypofunction: Secondary | ICD-10-CM | POA: Diagnosis not present

## 2018-04-23 DIAGNOSIS — N529 Male erectile dysfunction, unspecified: Secondary | ICD-10-CM

## 2018-04-23 DIAGNOSIS — L989 Disorder of the skin and subcutaneous tissue, unspecified: Secondary | ICD-10-CM | POA: Diagnosis not present

## 2018-04-23 DIAGNOSIS — J4520 Mild intermittent asthma, uncomplicated: Secondary | ICD-10-CM

## 2018-04-23 DIAGNOSIS — J309 Allergic rhinitis, unspecified: Secondary | ICD-10-CM

## 2018-04-23 MED ORDER — ALBUTEROL SULFATE HFA 108 (90 BASE) MCG/ACT IN AERS: INHALATION_SPRAY | RESPIRATORY_TRACT | 0 refills | Status: DC

## 2018-04-23 MED ORDER — FLUTICASONE FUROATE 100 MCG/ACT IN AEPB: 1.0000 | INHALATION_SPRAY | Freq: Every day | RESPIRATORY_TRACT | 5 refills | Status: DC

## 2018-04-23 MED ORDER — TADALAFIL 5 MG PO TABS: ORAL_TABLET | ORAL | 3 refills | Status: DC

## 2018-04-23 NOTE — Patient Instructions (Signed)
Acute use the asthma medication during the spring and through May and see if you can stop it.  I will make sure you have the rescue inhaler.  If you have to use the rescue inhaler more than twice a week let me know

## 2018-04-23 NOTE — Progress Notes (Signed)
   Subjective:    Patient ID: [name], male    DOB: 1956-11-29, 62 y.o.   MRN: [id number]  HPI He is here for a recheck.  He is now on Transderm 4 mg.  He is having no difficulty with that.  He does need a follow-up testosterone level.  He also has a lesion present on the torso that he has had for several years and intermittently bleeds.  Does have underlying allergies and in the past had been on an asthma medication.  He has not been using this regularly but has been using his rescue inhaler especially this spring. He would also like a refill on his Cialis.  He does use this roughly 3 days/week and gets good results with this.  Review of Systems     Objective:   Physical Exam Alert and in no distress. Tympanic membranes and canals are normal. Pharyngeal area is normal. Neck is supple without adenopathy or thyromegaly. Cardiac exam shows a regular sinus rhythm without murmurs or gallops. Lungs show expiratory wheezing 1 cm scabbed lesion on the left torso.  It was easily removed.  Band-Aid applied.       Assessment & Plan:  Hypogonadism male - Plan: Testosterone  Mild intermittent asthma without complication - Plan: Fluticasone Furoate (ARNUITY ELLIPTA) 100 MCG/ACT AEPB, albuterol (VENTOLIN HFA) 108 (90 Base) MCG/ACT inhaler  Erectile dysfunction, unspecified erectile dysfunction type - Plan: tadalafil (CIALIS) 5 MG tablet  Skin lesion of back Recommend he leave the skin lesion alone and if it quiets down, return here for probable hyfrecation.  I explained that I did not think it was cancerous. I wrote a prescription for Cialis and recommend he try to get it from a [place] [facility].  He was comfortable with that. Recommend he continue on his allergy medications.  I will also give him Arnuity and Proventil.  He is to use the medications especially through the month of May and then possibly only the Proventil after that.  He will keep me informed.  I explained that he is having  springtime allergies/asthma.

## 2018-04-24 LAB — TESTOSTERONE: Testosterone: 337 ng/dL (ref 264–916)

## 2018-05-19 IMAGING — CR DG CHEST 2V
2 series · 2 of 2 positions shown · non-contrast
Comparison: 08/23/2007.

CLINICAL DATA: Asthma.  Cough and shortness of breath for 1 month.

EXAM:
CHEST  2 VIEW

[w chest pa]
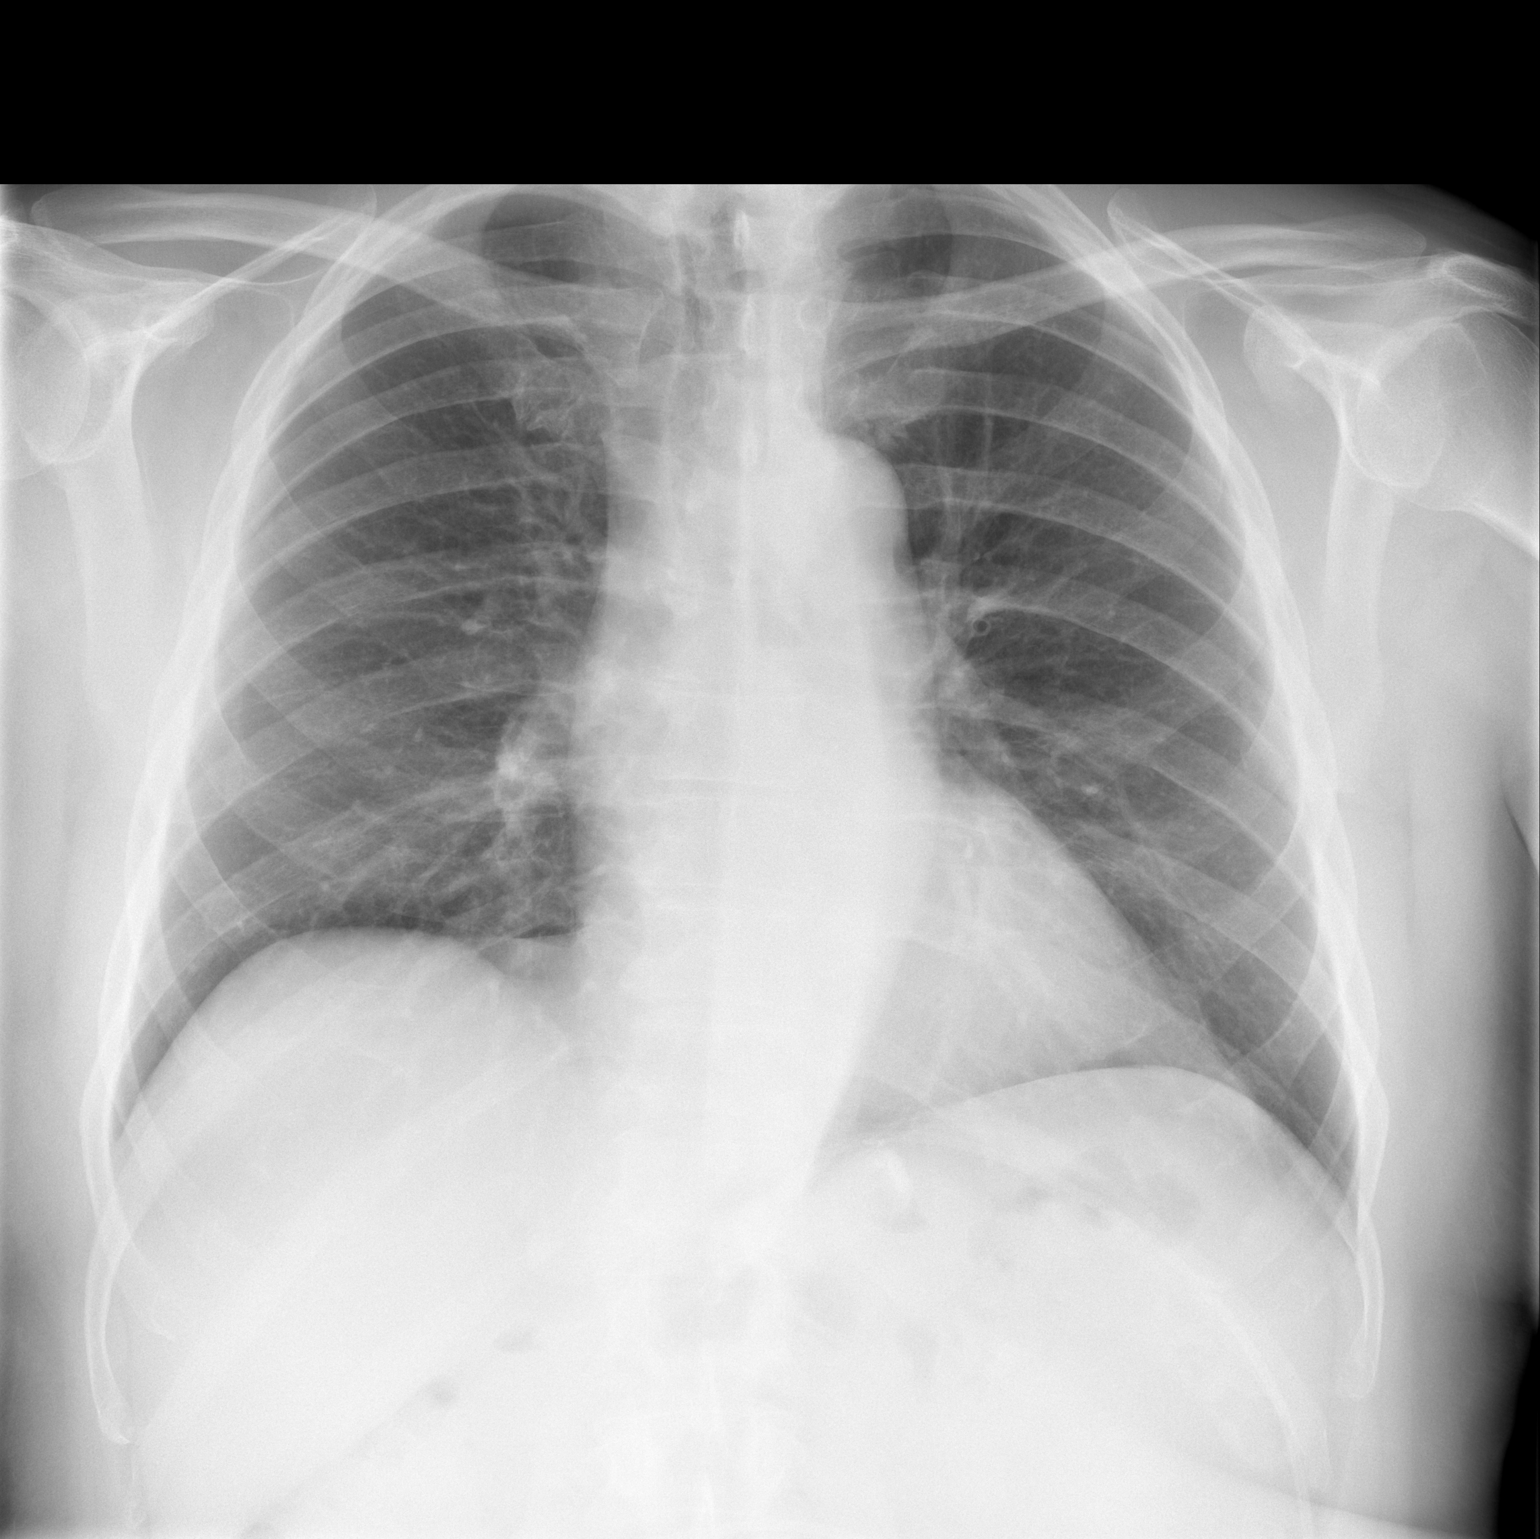

[w chest lat]
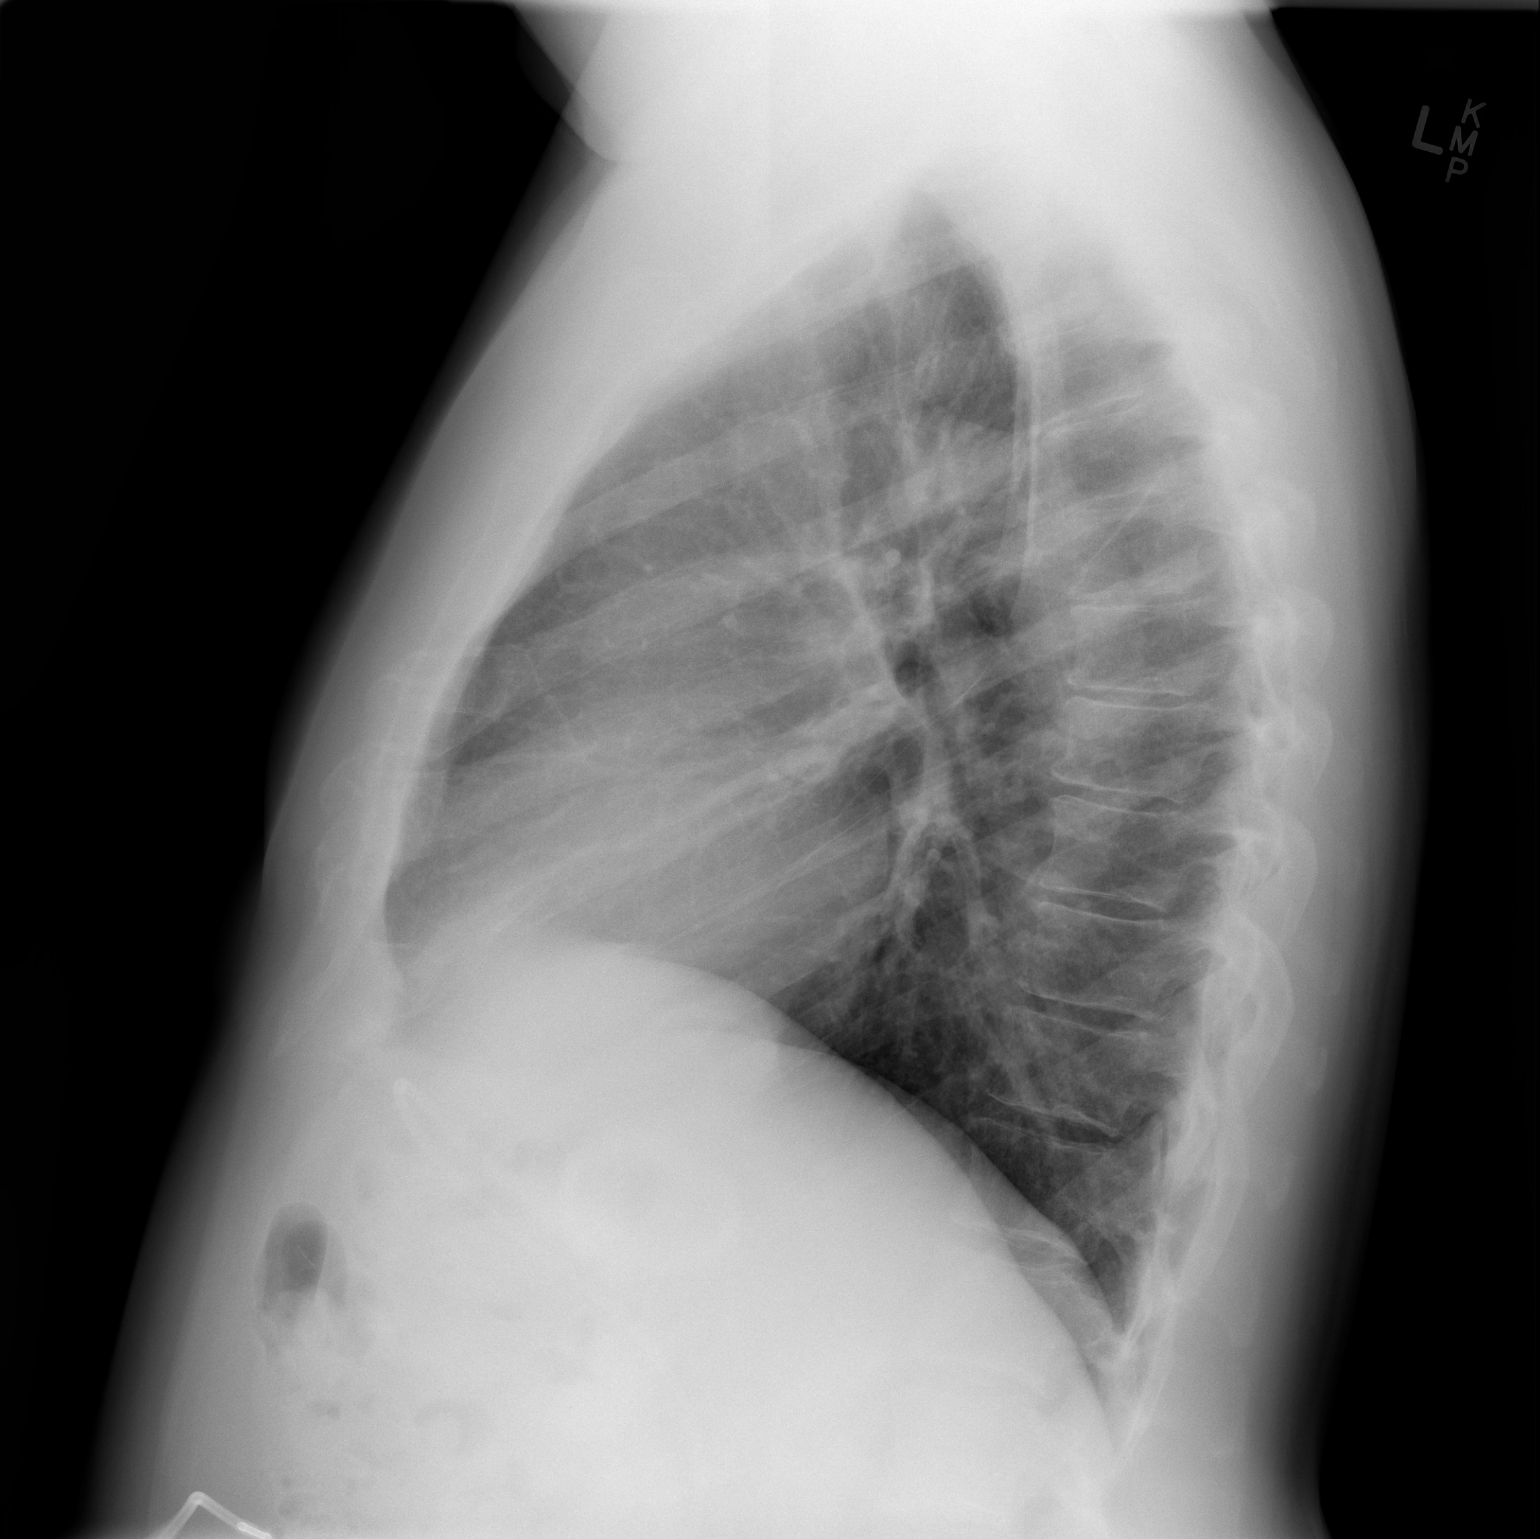

[2 of 2 positions shown; findings below may reference images not displayed]

FINDINGS: The lungs are clear wiithout focal pneumonia, edema, pneumothorax or
pleural effusion. The cardiopericardial silhouette is within normal
limits for size. The visualized bony structures of the thorax are
intact.
IMPRESSION: No active cardiopulmonary disease.

## 2018-06-10 ENCOUNTER — Other Ambulatory Visit: Payer: Self-pay | Admitting: Family Medicine

## 2018-06-10 DIAGNOSIS — I10 Essential (primary) hypertension: Secondary | ICD-10-CM

## 2018-09-15 ENCOUNTER — Other Ambulatory Visit: Payer: Self-pay | Admitting: Family Medicine

## 2018-09-15 DIAGNOSIS — J4520 Mild intermittent asthma, uncomplicated: Secondary | ICD-10-CM

## 2018-09-17 NOTE — Telephone Encounter (Signed)
Is this okay to refill? 

## 2018-10-01 ENCOUNTER — Other Ambulatory Visit: Payer: Self-pay | Admitting: Family Medicine

## 2018-10-01 DIAGNOSIS — I10 Essential (primary) hypertension: Secondary | ICD-10-CM

## 2018-10-04 ENCOUNTER — Other Ambulatory Visit (INDEPENDENT_AMBULATORY_CARE_PROVIDER_SITE_OTHER): Payer: 59

## 2018-10-04 DIAGNOSIS — Z23 Encounter for immunization: Secondary | ICD-10-CM | POA: Diagnosis not present

## 2018-11-26 ENCOUNTER — Encounter: Payer: Self-pay | Admitting: Family Medicine

## 2018-11-26 DIAGNOSIS — Z8619 Personal history of other infectious and parasitic diseases: Secondary | ICD-10-CM

## 2018-11-26 MED ORDER — ACYCLOVIR 200 MG PO CAPS: ORAL_CAPSULE | ORAL | 3 refills | Status: DC

## 2018-12-11 ENCOUNTER — Encounter: Payer: Self-pay | Admitting: Family Medicine

## 2018-12-11 ENCOUNTER — Ambulatory Visit: Payer: 59 | Admitting: Family Medicine

## 2018-12-11 ENCOUNTER — Telehealth: Payer: Self-pay | Admitting: Family Medicine

## 2018-12-11 VITALS — BP 100/70 | HR 65 | Temp 98.0°F | Ht 73.0 in | Wt 233.4 lb

## 2018-12-11 DIAGNOSIS — B9789 Other viral agents as the cause of diseases classified elsewhere: Secondary | ICD-10-CM | POA: Diagnosis not present

## 2018-12-11 DIAGNOSIS — J069 Acute upper respiratory infection, unspecified: Secondary | ICD-10-CM | POA: Diagnosis not present

## 2018-12-11 NOTE — Progress Notes (Signed)
   Subjective:    Patient ID: [name], male    DOB: 06/04/1956, 62 y.o.   MRN: [id number]  HPI He complains of a 5-day history of sore throat, dry cough, sinus congestion, rhinorrhea but no sore throat, fever, chills.  He does not smoke.   Review of Systems     Objective:   Physical Exam Alert and in no distress. Tympanic membranes and canals are normal. Pharyngeal area is normal. Neck is supple without adenopathy or thyromegaly. Cardiac exam shows a regular sinus rhythm without murmurs or gallops. Lungs are clear to auscultation.        Assessment & Plan:  Viral URI with cough Recommend supportive care.  If symptoms continue more than a week, he is to call me.  He was comfortable with that.

## 2018-12-11 NOTE — Telephone Encounter (Signed)
Pt states he forgot to ask if he was contagious and needed to know if he should stay away from people? Said you could send him the response back on my chart

## 2018-12-23 ENCOUNTER — Other Ambulatory Visit: Payer: Self-pay | Admitting: Family Medicine

## 2018-12-23 DIAGNOSIS — I10 Essential (primary) hypertension: Secondary | ICD-10-CM

## 2019-01-08 ENCOUNTER — Other Ambulatory Visit: Payer: Self-pay | Admitting: Family Medicine

## 2019-01-08 DIAGNOSIS — J4520 Mild intermittent asthma, uncomplicated: Secondary | ICD-10-CM

## 2019-01-08 NOTE — Telephone Encounter (Signed)
[facility] is requesting to fill pt arnuity inhaler. Please advise kh

## 2019-03-12 ENCOUNTER — Encounter: Payer: Self-pay | Admitting: Family Medicine

## 2019-03-17 ENCOUNTER — Other Ambulatory Visit: Payer: Self-pay | Admitting: Family Medicine

## 2019-03-17 DIAGNOSIS — I10 Essential (primary) hypertension: Secondary | ICD-10-CM

## 2019-04-25 ENCOUNTER — Other Ambulatory Visit: Payer: Self-pay | Admitting: Family Medicine

## 2019-04-25 DIAGNOSIS — I10 Essential (primary) hypertension: Secondary | ICD-10-CM

## 2019-04-26 ENCOUNTER — Ambulatory Visit: Payer: 59 | Admitting: Family Medicine

## 2019-04-26 ENCOUNTER — Encounter: Payer: Self-pay | Admitting: Family Medicine

## 2019-04-26 ENCOUNTER — Other Ambulatory Visit: Payer: Self-pay

## 2019-04-26 VITALS — Wt 233.0 lb

## 2019-04-26 DIAGNOSIS — Z9884 Bariatric surgery status: Secondary | ICD-10-CM

## 2019-04-26 DIAGNOSIS — J309 Allergic rhinitis, unspecified: Secondary | ICD-10-CM

## 2019-04-26 DIAGNOSIS — Z86010 Personal history of colonic polyps: Secondary | ICD-10-CM

## 2019-04-26 DIAGNOSIS — Z6831 Body mass index (BMI) 31.0-31.9, adult: Secondary | ICD-10-CM

## 2019-04-26 DIAGNOSIS — Z87891 Personal history of nicotine dependence: Secondary | ICD-10-CM

## 2019-04-26 DIAGNOSIS — G4733 Obstructive sleep apnea (adult) (pediatric): Secondary | ICD-10-CM

## 2019-04-26 DIAGNOSIS — Z79899 Other long term (current) drug therapy: Secondary | ICD-10-CM

## 2019-04-26 DIAGNOSIS — Z860101 Personal history of adenomatous and serrated colon polyps: Secondary | ICD-10-CM

## 2019-04-26 DIAGNOSIS — E291 Testicular hypofunction: Secondary | ICD-10-CM

## 2019-04-26 DIAGNOSIS — Z8679 Personal history of other diseases of the circulatory system: Secondary | ICD-10-CM

## 2019-04-26 DIAGNOSIS — J4520 Mild intermittent asthma, uncomplicated: Secondary | ICD-10-CM | POA: Diagnosis not present

## 2019-04-26 DIAGNOSIS — I10 Essential (primary) hypertension: Secondary | ICD-10-CM

## 2019-04-26 DIAGNOSIS — E6609 Other obesity due to excess calories: Secondary | ICD-10-CM

## 2019-04-26 NOTE — Progress Notes (Signed)
   Subjective:    Patient ID: [name], male    DOB: 1956/11/14, 63 y.o.   MRN: [id number]  HPI Documentation for virtual telephone encounter.  Documentation for virtual audio and video telecommunications through [place] encounter:  The patient was located at home. The provider was located in the office. The patient did consent to this visit and is aware of possible charges through their insurance for this visit. The other persons participating in this telemedicine service were none. Time spent on call was 5 minutes and in review of previous records >25 minutes total. This virtual service is not related to other E/M service within previous 7 days. He is here for medication management visit.  His main complaint today is noticing difficulty with his memory.  He notes that he is now becoming slightly more forgetful.  So far has not interfered with his functioning at work. His allergies seem to be under good control.  He uses Xyzal on an as-needed basis.  He also has underlying asthma and is doing very good on his present [place] and rarely uses albuterol. His weight is stable.  He has had no chest pain, irregular heart rate.  He does have a history of OSA however sleep study showed a very low AHI requiring no CPAP. Presently he is not on any testosterone replacement. He continues on lisinopril and is having no trouble with that.  He also takes a baby aspirin every day. Does have a history of colonic polyps and is scheduled for repeat colonoscopy in a couple years. Review of Systems     Objective:   Physical Exam Alert and in no distress otherwise not examined       Assessment & Plan:  Allergic rhinitis, unspecified seasonality, unspecified trigger  Hypogonadism male  Mild intermittent asthma without complication  Hypogonadism in male  Essential hypertension - Plan: CBC with Differential/Platelet, Comprehensive metabolic panel  Former smoker, stopped smoking in  distant past  History of adenomatous polyp of colon  History of atrial fibrillation  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  Class 1 obesity due to excess calories without serious comorbidity with body mass index (BMI) of 31.0 to 31.9 in adult - Plan: CBC with Differential/Platelet, Comprehensive metabolic panel, Lipid panel  Obstructive sleep apnea  Encounter for long-term (current) use of medications - Plan: CBC with Differential/Platelet, Comprehensive metabolic panel, Lipid panel  Encouraged him to continue with his present medication regimen.  He seems to be quite stable on his present meds.

## 2019-04-27 LAB — COMPREHENSIVE METABOLIC PANEL
ALT: 12 IU/L (ref 0–44)
AST: 25 IU/L (ref 0–40)
Albumin/Globulin Ratio: 1.3 (ref 1.2–2.2)
Albumin: 4 g/dL (ref 3.8–4.8)
Alkaline Phosphatase: 57 IU/L (ref 39–117)
BUN/Creatinine Ratio: 12 (ref 10–24)
BUN: 14 mg/dL (ref 8–27)
Bilirubin Total: 0.3 mg/dL (ref 0.0–1.2)
CO2: 23 mmol/L (ref 20–29)
Calcium: 9.3 mg/dL (ref 8.6–10.2)
Chloride: 101 mmol/L (ref 96–106)
Creatinine, Ser: 1.13 mg/dL (ref 0.76–1.27)
GFR calc Af Amer: 80 mL/min/{1.73_m2} (ref >59)
GFR calc non Af Amer: 69 mL/min/{1.73_m2} (ref >59)
Globulin, Total: 3 g/dL (ref 1.5–4.5)
Glucose: 82 mg/dL (ref 65–99)
Potassium: 4.3 mmol/L (ref 3.5–5.2)
Sodium: 141 mmol/L (ref 134–144)
Total Protein: 7 g/dL (ref 6.0–8.5)

## 2019-04-27 LAB — CBC WITH DIFFERENTIAL/PLATELET
Basophils Absolute: 0 10*3/uL (ref 0.0–0.2)
Basos: 1 %
EOS (ABSOLUTE): 0.3 10*3/uL (ref 0.0–0.4)
Eos: 5 %
Hematocrit: 40.5 % (ref 37.5–51.0)
Hemoglobin: 13.3 g/dL (ref 13.0–17.7)
Immature Grans (Abs): 0 10*3/uL (ref 0.0–0.1)
Immature Granulocytes: 0 %
Lymphocytes Absolute: 2.2 10*3/uL (ref 0.7–3.1)
Lymphs: 35 %
MCH: 29.3 pg (ref 26.6–33.0)
MCHC: 32.8 g/dL (ref 31.5–35.7)
MCV: 89 fL (ref 79–97)
Monocytes Absolute: 0.4 10*3/uL (ref 0.1–0.9)
Monocytes: 7 %
Neutrophils Absolute: 3.3 10*3/uL (ref 1.4–7.0)
Neutrophils: 52 %
Platelets: 245 10*3/uL (ref 150–450)
RBC: 4.54 x10E6/uL (ref 4.14–5.80)
RDW: 12.2 % (ref 11.6–15.4)
WBC: 6.3 10*3/uL (ref 3.4–10.8)

## 2019-04-27 LAB — LIPID PANEL
Chol/HDL Ratio: 4.6 ratio (ref 0.0–5.0)
Cholesterol, Total: 192 mg/dL (ref 100–199)
HDL: 42 mg/dL (ref >39)
LDL Calculated: 125 mg/dL — ABNORMAL HIGH (ref 0–99)
Triglycerides: 127 mg/dL (ref 0–149)
VLDL Cholesterol Cal: 25 mg/dL (ref 5–40)

## 2019-05-23 ENCOUNTER — Other Ambulatory Visit: Payer: Self-pay | Admitting: Family Medicine

## 2019-05-23 DIAGNOSIS — I10 Essential (primary) hypertension: Secondary | ICD-10-CM

## 2019-08-09 ENCOUNTER — Other Ambulatory Visit: Payer: Self-pay | Admitting: Family Medicine

## 2019-08-09 DIAGNOSIS — J4520 Mild intermittent asthma, uncomplicated: Secondary | ICD-10-CM

## 2019-08-09 NOTE — Telephone Encounter (Signed)
Is this ok to refill?  

## 2019-08-17 ENCOUNTER — Other Ambulatory Visit: Payer: Self-pay | Admitting: Family Medicine

## 2019-08-17 DIAGNOSIS — J4520 Mild intermittent asthma, uncomplicated: Secondary | ICD-10-CM

## 2019-08-19 NOTE — Telephone Encounter (Signed)
[facility] is requesting to fill pt arnuity. Please advise. KH 

## 2019-08-20 ENCOUNTER — Telehealth: Payer: Self-pay

## 2019-08-20 DIAGNOSIS — I10 Essential (primary) hypertension: Secondary | ICD-10-CM

## 2019-08-20 MED ORDER — LISINOPRIL-HYDROCHLOROTHIAZIDE 10-12.5 MG PO TABS: 1.0000 | ORAL_TABLET | Freq: Every day | ORAL | 0 refills | Status: DC

## 2019-08-20 NOTE — Telephone Encounter (Signed)
[facility] has sent a fax requesting a refill on Lisinopril

## 2020-01-07 ENCOUNTER — Encounter: Payer: Self-pay | Admitting: Family Medicine

## 2020-01-10 ENCOUNTER — Ambulatory Visit: Payer: 59 | Attending: Internal Medicine

## 2020-01-10 DIAGNOSIS — Z20822 Contact with and (suspected) exposure to covid-19: Secondary | ICD-10-CM

## 2020-01-11 ENCOUNTER — Encounter: Payer: Self-pay | Admitting: Family Medicine

## 2020-01-11 LAB — NOVEL CORONAVIRUS, NAA: SARS-CoV-2, NAA: NOT DETECTED

## 2020-02-08 ENCOUNTER — Other Ambulatory Visit: Payer: Self-pay | Admitting: Family Medicine

## 2020-02-08 DIAGNOSIS — I10 Essential (primary) hypertension: Secondary | ICD-10-CM

## 2020-03-05 ENCOUNTER — Encounter: Payer: Self-pay | Admitting: Family Medicine

## 2020-03-10 ENCOUNTER — Other Ambulatory Visit: Payer: Self-pay | Admitting: Family Medicine

## 2020-03-10 DIAGNOSIS — Z8619 Personal history of other infectious and parasitic diseases: Secondary | ICD-10-CM

## 2020-03-11 NOTE — Telephone Encounter (Signed)
[facility] is requesting to fill pt acyclovir. Please advise KH 

## 2020-04-05 ENCOUNTER — Other Ambulatory Visit: Payer: Self-pay | Admitting: Family Medicine

## 2020-04-05 DIAGNOSIS — J4520 Mild intermittent asthma, uncomplicated: Secondary | ICD-10-CM

## 2020-04-06 NOTE — Telephone Encounter (Signed)
[facility] is requesting to fill pt arnuity. Please advise. [place]

## 2020-04-30 ENCOUNTER — Other Ambulatory Visit: Payer: Self-pay | Admitting: Family Medicine

## 2020-04-30 DIAGNOSIS — I10 Essential (primary) hypertension: Secondary | ICD-10-CM

## 2020-05-10 ENCOUNTER — Other Ambulatory Visit: Payer: Self-pay | Admitting: Family Medicine

## 2020-05-10 DIAGNOSIS — J4520 Mild intermittent asthma, uncomplicated: Secondary | ICD-10-CM

## 2020-05-11 NOTE — Telephone Encounter (Signed)
[facility] is requesting to fill pt inaheler ventolin. Please advise [facility]

## 2020-05-18 ENCOUNTER — Ambulatory Visit (INDEPENDENT_AMBULATORY_CARE_PROVIDER_SITE_OTHER): Payer: 59 | Admitting: Family Medicine

## 2020-05-18 ENCOUNTER — Telehealth: Payer: Self-pay | Admitting: Family Medicine

## 2020-05-18 ENCOUNTER — Other Ambulatory Visit: Payer: Self-pay

## 2020-05-18 ENCOUNTER — Encounter: Payer: Self-pay | Admitting: Family Medicine

## 2020-05-18 VITALS — BP 98/68 | HR 67 | Temp 97.8°F | Ht 73.0 in | Wt 221.6 lb

## 2020-05-18 DIAGNOSIS — Z9884 Bariatric surgery status: Secondary | ICD-10-CM | POA: Diagnosis not present

## 2020-05-18 DIAGNOSIS — J4530 Mild persistent asthma, uncomplicated: Secondary | ICD-10-CM

## 2020-05-18 DIAGNOSIS — E6609 Other obesity due to excess calories: Secondary | ICD-10-CM

## 2020-05-18 DIAGNOSIS — G4733 Obstructive sleep apnea (adult) (pediatric): Secondary | ICD-10-CM

## 2020-05-18 DIAGNOSIS — N529 Male erectile dysfunction, unspecified: Secondary | ICD-10-CM

## 2020-05-18 DIAGNOSIS — I10 Essential (primary) hypertension: Secondary | ICD-10-CM | POA: Diagnosis not present

## 2020-05-18 DIAGNOSIS — Z8679 Personal history of other diseases of the circulatory system: Secondary | ICD-10-CM | POA: Diagnosis not present

## 2020-05-18 DIAGNOSIS — Z87891 Personal history of nicotine dependence: Secondary | ICD-10-CM

## 2020-05-18 DIAGNOSIS — Z86010 Personal history of colonic polyps: Secondary | ICD-10-CM

## 2020-05-18 DIAGNOSIS — Z6831 Body mass index (BMI) 31.0-31.9, adult: Secondary | ICD-10-CM

## 2020-05-18 DIAGNOSIS — J309 Allergic rhinitis, unspecified: Secondary | ICD-10-CM

## 2020-05-18 DIAGNOSIS — E291 Testicular hypofunction: Secondary | ICD-10-CM

## 2020-05-18 MED ORDER — TADALAFIL 20 MG PO TABS: ORAL_TABLET | ORAL | 5 refills | Status: DC

## 2020-05-18 NOTE — Telephone Encounter (Signed)
Pt called and requested rx be sent to [facility] at [place].

## 2020-05-18 NOTE — Telephone Encounter (Signed)
Pt advised new med is going to be cheaper if you send it to [facility] [address] and everything else is needs to go to [facility]. Thank [facility]

## 2020-05-18 NOTE — Progress Notes (Signed)
   Subjective:    Patient ID: [name], male    DOB: [birth year], 64 y.o.   MRN: [id number]  HPI He is here for med check appointment.  He does have underlying asthma and is using Arnuity and has a rescue of Ventolin.  It is somewhat seasonal.  He does have seasonal allergies as well and presently is on Xyzal.  He has had lap band surgery and his weight is actually down from previous years.  He has been doing more yard work.  He does have a history of OSA but was tested after he lost weight and no longer needs CPAP.  He does have a history of hypogonadism but presently is on no medications due to cost.  He also has remote history of atrial fibrillation but is not had any difficulty with that and is not presently on medication.  He does have melanosis polyp and is scheduled to have another.  He does have some difficulty with erectile dysfunction.  He has used Cialis in the past.  Colonoscopy next year.  He continues on lisinopril/HCTZ and is having no difficulty with that.   Review of Systems     Objective:   Physical Exam Alert and in no distress. Tympanic membranes and canals are normal. Pharyngeal area is normal. Neck is supple without adenopathy or thyromegaly. Cardiac exam shows a regular sinus rhythm without murmurs or gallops. Lungs are clear to auscultation.        Assessment & Plan:  Class 1 obesity due to excess calories without serious comorbidity with body mass index (BMI) of 31.0 to 31.9 in adult - Plan: CBC with Differential/Platelet, Comprehensive metabolic panel, Lipid panel  History of atrial fibrillation  Obstructive sleep apnea  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  Essential hypertension  Mild persistent asthma without complication  History of adenomatous polyp of colon  Former smoker, stopped smoking in distant past  Allergic rhinitis, unspecified seasonality, unspecified trigger  Hypogonadism male - Plan: PSA, Testosterone  Erectile dysfunction, unspecified erectile  dysfunction type - Plan: tadalafil (CIALIS) 20 MG tablet I will renew his other medicines when I verify which pharmacy to call them to. I will place him back on AndroGel if allowed by his insurance otherwise whichever one they will allow.

## 2020-05-18 NOTE — Telephone Encounter (Signed)
There were several [facility] of [place] out which one

## 2020-05-19 LAB — COMPREHENSIVE METABOLIC PANEL
ALT: 12 IU/L (ref 0–44)
AST: 26 IU/L (ref 0–40)
Albumin/Globulin Ratio: 1.3 (ref 1.2–2.2)
Albumin: 4.4 g/dL (ref 3.8–4.8)
Alkaline Phosphatase: 67 IU/L (ref 48–121)
BUN/Creatinine Ratio: 10 (ref 10–24)
BUN: 14 mg/dL (ref 8–27)
Bilirubin Total: 0.4 mg/dL (ref 0.0–1.2)
CO2: 24 mmol/L (ref 20–29)
Calcium: 10.1 mg/dL (ref 8.6–10.2)
Chloride: 99 mmol/L (ref 96–106)
Creatinine, Ser: 1.45 mg/dL — ABNORMAL HIGH (ref 0.76–1.27)
GFR calc Af Amer: 59 mL/min/{1.73_m2} — ABNORMAL LOW (ref >59)
GFR calc non Af Amer: 51 mL/min/{1.73_m2} — ABNORMAL LOW (ref >59)
Globulin, Total: 3.4 g/dL (ref 1.5–4.5)
Glucose: 89 mg/dL (ref 65–99)
Potassium: 4.8 mmol/L (ref 3.5–5.2)
Sodium: 140 mmol/L (ref 134–144)
Total Protein: 7.8 g/dL (ref 6.0–8.5)

## 2020-05-19 LAB — CBC WITH DIFFERENTIAL/PLATELET
Basophils Absolute: 0 10*3/uL (ref 0.0–0.2)
Basos: 1 %
EOS (ABSOLUTE): 0.1 10*3/uL (ref 0.0–0.4)
Eos: 1 %
Hematocrit: 45.6 % (ref 37.5–51.0)
Hemoglobin: 15 g/dL (ref 13.0–17.7)
Immature Grans (Abs): 0 10*3/uL (ref 0.0–0.1)
Immature Granulocytes: 0 %
Lymphocytes Absolute: 2.1 10*3/uL (ref 0.7–3.1)
Lymphs: 32 %
MCH: 28.8 pg (ref 26.6–33.0)
MCHC: 32.9 g/dL (ref 31.5–35.7)
MCV: 88 fL (ref 79–97)
Monocytes Absolute: 0.5 10*3/uL (ref 0.1–0.9)
Monocytes: 7 %
Neutrophils Absolute: 3.9 10*3/uL (ref 1.4–7.0)
Neutrophils: 59 %
Platelets: 263 10*3/uL (ref 150–450)
RBC: 5.2 x10E6/uL (ref 4.14–5.80)
RDW: 12.6 % (ref 11.6–15.4)
WBC: 6.6 10*3/uL (ref 3.4–10.8)

## 2020-05-19 LAB — LIPID PANEL
Chol/HDL Ratio: 3.8 ratio (ref 0.0–5.0)
Cholesterol, Total: 200 mg/dL — ABNORMAL HIGH (ref 100–199)
HDL: 53 mg/dL (ref >39)
LDL Chol Calc (NIH): 133 mg/dL — ABNORMAL HIGH (ref 0–99)
Triglycerides: 79 mg/dL (ref 0–149)
VLDL Cholesterol Cal: 14 mg/dL (ref 5–40)

## 2020-05-19 LAB — PSA: Prostate Specific Ag, Serum: 0.5 ng/mL (ref 0.0–4.0)

## 2020-05-19 LAB — TESTOSTERONE: Testosterone: 227 ng/dL — ABNORMAL LOW (ref 264–916)

## 2020-05-19 MED ORDER — LISINOPRIL-HYDROCHLOROTHIAZIDE 10-12.5 MG PO TABS: 1.0000 | ORAL_TABLET | Freq: Every day | ORAL | 3 refills | Status: DC

## 2020-05-19 MED ORDER — TESTOSTERONE 20.25 MG/ACT (1.62%) TD GEL: 2.0000 | Freq: Every day | TRANSDERMAL | 5 refills | Status: DC

## 2020-05-19 NOTE — Addendum Note (Signed)
Addended by: Denita Lung on: 05/19/2020 10:46 AM   Modules accepted: Orders

## 2020-05-19 NOTE — Addendum Note (Signed)
Addended by: Denita Lung on: 05/19/2020 04:20 PM   Modules accepted: Orders

## 2020-05-22 ENCOUNTER — Encounter: Payer: Self-pay | Admitting: Family Medicine

## 2020-07-17 ENCOUNTER — Encounter: Payer: Self-pay | Admitting: Family Medicine

## 2020-07-21 ENCOUNTER — Telehealth: Payer: Self-pay | Admitting: Family Medicine

## 2020-07-21 MED ORDER — BREO ELLIPTA 200-25 MCG/INH IN AEPB: 1.0000 | INHALATION_SPRAY | Freq: Every day | RESPIRATORY_TRACT | 3 refills | Status: DC

## 2020-07-21 NOTE — Telephone Encounter (Signed)
Fax received for P.A. ARNUITY ELLIPTA, covered alternatives are Fluticasone-Salmeterol/ Flovent HFA & Diskus/ Anoro/ Pulmicort Flexhaler/ Symbicort & Breo.  I called pt and he hasn't tried any of the above and is ok with switching to either of them which ever you think will be cheapest.  Please send in covered alternative.

## 2020-07-21 NOTE — Telephone Encounter (Signed)
Called pt regarding another RX and he states just got a call regarding his Ventolin cost $200+ at pharmacy & he can't afford that. Said he is ok with any albuterol, no issues at all, this is only his rescue inhaler.  I called pharmacy & they were running as brand because DAW was printed advised ok for generic and went thru for $13.  Pt informed

## 2020-07-23 ENCOUNTER — Telehealth: Payer: Self-pay | Admitting: Family Medicine

## 2020-07-23 NOTE — Telephone Encounter (Signed)
P.A. ARNUITY was switched to [facility], went thru for $114. Pt informed

## 2020-08-04 ENCOUNTER — Other Ambulatory Visit: Payer: Self-pay | Admitting: Family Medicine

## 2020-08-04 MED ORDER — LOSARTAN POTASSIUM-HCTZ 50-12.5 MG PO TABS
1.0000 | ORAL_TABLET | Freq: Every day | ORAL | 3 refills | Status: DC
Start: 2020-08-04 — End: 2021-03-09

## 2020-09-16 ENCOUNTER — Telehealth: Payer: Self-pay | Admitting: Family Medicine

## 2020-09-16 ENCOUNTER — Ambulatory Visit: Payer: Managed Care, Other (non HMO) | Admitting: Family Medicine

## 2020-09-16 ENCOUNTER — Encounter: Payer: Self-pay | Admitting: Family Medicine

## 2020-09-16 ENCOUNTER — Other Ambulatory Visit: Payer: Self-pay

## 2020-09-16 VITALS — BP 130/84 | HR 62 | Temp 99.1°F | Ht 72.0 in | Wt 214.4 lb

## 2020-09-16 DIAGNOSIS — E291 Testicular hypofunction: Secondary | ICD-10-CM | POA: Diagnosis not present

## 2020-09-16 DIAGNOSIS — Z23 Encounter for immunization: Secondary | ICD-10-CM | POA: Diagnosis not present

## 2020-09-16 DIAGNOSIS — Z87891 Personal history of nicotine dependence: Secondary | ICD-10-CM

## 2020-09-16 DIAGNOSIS — R413 Other amnesia: Secondary | ICD-10-CM

## 2020-09-16 DIAGNOSIS — I10 Essential (primary) hypertension: Secondary | ICD-10-CM

## 2020-09-16 DIAGNOSIS — E6609 Other obesity due to excess calories: Secondary | ICD-10-CM | POA: Diagnosis not present

## 2020-09-16 DIAGNOSIS — J309 Allergic rhinitis, unspecified: Secondary | ICD-10-CM

## 2020-09-16 DIAGNOSIS — Z8679 Personal history of other diseases of the circulatory system: Secondary | ICD-10-CM | POA: Diagnosis not present

## 2020-09-16 DIAGNOSIS — Z9884 Bariatric surgery status: Secondary | ICD-10-CM

## 2020-09-16 DIAGNOSIS — G4733 Obstructive sleep apnea (adult) (pediatric): Secondary | ICD-10-CM | POA: Diagnosis not present

## 2020-09-16 DIAGNOSIS — J4530 Mild persistent asthma, uncomplicated: Secondary | ICD-10-CM

## 2020-09-16 DIAGNOSIS — N529 Male erectile dysfunction, unspecified: Secondary | ICD-10-CM

## 2020-09-16 DIAGNOSIS — Z0000 Encounter for general adult medical examination without abnormal findings: Secondary | ICD-10-CM

## 2020-09-16 DIAGNOSIS — Z86010 Personal history of colonic polyps: Secondary | ICD-10-CM

## 2020-09-16 DIAGNOSIS — Z6831 Body mass index (BMI) 31.0-31.9, adult: Secondary | ICD-10-CM

## 2020-09-16 LAB — POCT URINALYSIS DIP (PROADVANTAGE DEVICE)
Bilirubin, UA: NEGATIVE
Blood, UA: NEGATIVE
Glucose, UA: NEGATIVE mg/dL
Ketones, POC UA: NEGATIVE mg/dL
Leukocytes, UA: NEGATIVE
Nitrite, UA: NEGATIVE
Specific Gravity, Urine: 5.5
Urobilinogen, Ur: 0.2
pH, UA: 5.5 (ref 5.0–8.0)

## 2020-09-16 MED ORDER — TESTOSTERONE 20.25 MG/ACT (1.62%) TD GEL: 2.0000 | Freq: Every day | TRANSDERMAL | 5 refills | Status: DC

## 2020-09-16 NOTE — Progress Notes (Signed)
   Subjective:    Patient ID: [name], male    DOB: [birth year], 64 y.o.   MRN: [id number]  HPI He is here for complete examination.  He complains of cold extremities mainly in his feet.  He does not smoke.  He has not had any changes in his hair growth.  He has no pain with physical activity.  He does complain of decreased memory.  He notes that he is having more difficulty at work and also has noted that some of his colleagues have commented upon his lack of being as sharp as he has been in the past.  He has a history of hypogonadism but did not start taking the AndroGel when it was last called in which was in May.  He did have difficulty with angioedema from ACE and was switched to losartan and since being placed on losartan he has had 2 more episodes of swelling.  He does have a history of OSA but presently is not on CPAP as he has done well since he has lost a lot of weight.  He does have a history of lap band surgery.  He does have asthma and continues on his bronchodilators.  Does occasionally use Cialis for erectile dysfunction.  He does have a previous history of atrial for but has not had any difficulty with that recently.  His allergies seem to be under good control.  He does have a history of adenomatous colonic polyp and is scheduled for routine follow-up concerning that.   Review of Systems  All other systems reviewed and are negative.      Objective:   Physical Exam Alert and in no distress. Tympanic membranes and canals are normal. Pharyngeal area is normal. Neck is supple without adenopathy or thyromegaly. Cardiac exam shows a regular sinus rhythm without murmurs or gallops. Lungs are clear to auscultation. 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  I did do blood work to check on his memory issues.  We will probably refer him to neurology pending blood results.  Also renew his testosterone as there is no good reason why he did not start taking this again.

## 2020-09-16 NOTE — Telephone Encounter (Signed)
Pt dropped off wellness form to be completed for his employer Sent back in folder  Please call pt when ready  [phone]

## 2020-09-17 ENCOUNTER — Other Ambulatory Visit: Payer: Self-pay

## 2020-09-17 DIAGNOSIS — R413 Other amnesia: Secondary | ICD-10-CM

## 2020-09-17 LAB — TSH: TSH: 2.83 u[IU]/mL (ref 0.450–4.500)

## 2020-09-17 LAB — VITAMIN B12: Vitamin B-12: 248 pg/mL (ref 232–1245)

## 2020-09-17 LAB — HIV ANTIBODY (ROUTINE TESTING W REFLEX): HIV Screen 4th Generation wRfx: NONREACTIVE

## 2020-09-17 LAB — FOLATE: Folate: >20 ng/mL (ref >3.0)

## 2020-09-17 LAB — RPR: RPR Ser Ql: NONREACTIVE

## 2020-09-18 ENCOUNTER — Telehealth: Payer: Self-pay

## 2020-09-18 NOTE — Telephone Encounter (Signed)
Pt was advised form was done and faxed over. A copy was made for his chart and he will come by and pick up orig. [place]

## 2020-09-19 ENCOUNTER — Telehealth: Payer: Self-pay | Admitting: Family Medicine

## 2020-09-19 NOTE — Telephone Encounter (Signed)
P.A. TESTOSTERONE 20.25 

## 2020-10-03 NOTE — Telephone Encounter (Signed)
P.A. approved til 09/19/21

## 2020-10-09 NOTE — Telephone Encounter (Signed)
Pt informed & called pharmacy

## 2020-10-22 ENCOUNTER — Other Ambulatory Visit: Payer: Self-pay | Admitting: Family Medicine

## 2020-10-22 DIAGNOSIS — J4520 Mild intermittent asthma, uncomplicated: Secondary | ICD-10-CM

## 2020-10-22 NOTE — Telephone Encounter (Signed)
[facility] is requesting to fill pt ventolin. Please advise KH 

## 2020-11-24 NOTE — Progress Notes (Addendum)
[facility]    Provider:  Dr Jaynee Stokes Requesting Provider: Denita Lung, MD Primary Care Provider:  Denita Lung, MD  CC:  Memory loss  HPI:  [name] is a 64 y.o. male here as requested by Charles Lung, MD for memory difficulties.  Past medical history hypertension, obstructive sleep apnea, mild persistent asthma, brown tongue, hypogonadism, obesity status post surgery, diverticulosis, A. fib, former smoker. I reviewed [name] notes: At prior appointment he complained of decreased memory, having more difficulty at work and also noted some of his colleagues have commented upon his lack of being as sharp as he has been in the past, he has a history of hypogonadism but did not start taking the AndroGel when it was called in last May, he has had some angioedema in from ACE and switched to losartan, he does have a history of obstructive sleep apnea not on CPAP as he has done well by losing a lot of weight, he does have a history of LAP-BAND surgery, previous history of A. fib, sent for evaluation.  He is here with his wife. He travels a lot. It seems like more and more he can't remember their names or who they are for a few moments or until someone reminds him. He forgets where he puts things. His wife provides much information and she says it was several times they were driving and he panicked and thought he had to turn for 3-5 minutes he remembers these episodes and says he feels like he is lost. Worsening and more noticeable for a year. But ongoing for longer. He struggles with people's names but then it will pop up. He asks his wife a lot where things are like she put it somewhere different. Patient pays the bills, he set them up automatically, he forgets to pay some bills but pays it when he gets the notice. He gets frustrated when his wife tries to help. He asks the same things over sometimes. He didn't know what day it was today. The things that bothers him the  most is that he forgets people's names. Or he needs to reference someone and he doesn't know the name, if he dwells on it a while he can remember it. 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 He feels he is making more errors. No history of significant alcohol or drug use. He is a former smoker but not heavy socially and that is 40 years ago. He lost a lot of weight and they did another sleep evaluation, he can fall asleep if he sits in a recliner. Been at least 10 years since last sleep test. He does not feel sad or feel depressed but he feels upset and mad that he can;t remember things. He doesn't know a lot about his family. No hallucinations or delusions. He does feel that as he gets older he says things he wouldn't in the past just because he doesn't care anymore what people think. When they go out to eat for example he gets more irritated and he has left the restaurant because of some perceived slight. He gets "short" with people more often.   Reviewed notes, labs and imaging from outside physicians, which showed: See above   Review of Systems: Patient complains of symptoms per HPI as well as the following symptoms: memory loss, fatigue. Pertinent negatives and positives per HPI. All others negative.   Social History   Socioeconomic History  . Marital  status: Married    Spouse name: [name]  . Number of children: 1  . Years of education: Not on file  . Highest education level: Associate degree: occupational, Hotel manager, or vocational program  Occupational History    Comment: [name]  Tobacco Use  . Smoking status: Former Smoker    Types: Cigarettes    Quit date: 12/26/1997    Years since quitting: 22.9  . Smokeless tobacco: Never Used  Substance and Sexual Activity  . Alcohol use: Yes    Alcohol/week: 1.0 standard drink    Types: 1 Glasses of wine per week    Comment: Monthly  . Drug use: No  . Sexual activity: Yes  Other  Topics Concern  . Not on file  Social History Narrative   Lives with wife, 1 son and 1 daughter deceased   Sodas, 3 a day   Social Determinants of Health   Financial Resource Strain:   . Difficulty of Paying Living Expenses: Not on file  Food Insecurity:   . Worried About Charity fundraiser in the Last Year: Not on file  . Ran Out of Food in the Last Year: Not on file  Transportation Needs:   . Lack of Transportation (Medical): Not on file  . Lack of Transportation (Non-Medical): Not on file  Physical Activity:   . Days of Exercise per Week: Not on file  . Minutes of Exercise per Session: Not on file  Stress:   . Feeling of Stress : Not on file  Social Connections:   . Frequency of Communication with Friends and Family: Not on file  . Frequency of Social Gatherings with Friends and Family: Not on file  . Attends Religious Services: Not on file  . Active Member of Clubs or Organizations: Not on file  . Attends Archivist Meetings: Not on file  . Marital Status: Not on file  Intimate Partner Violence:   . Fear of Current or Ex-Partner: Not on file  . Emotionally Abused: Not on file  . Physically Abused: Not on file  . Sexually Abused: Not on file    Family History  Problem Relation Age of Onset  . Arthritis Mother   . Hypertension Mother   . Ulcers Mother   . Breast cancer Mother   . Emphysema Father   . Asthma Maternal Aunt   . Heart disease Maternal Uncle   . Stroke Maternal Grandmother   . Heart disease Paternal Grandmother   . Colon cancer Neg Hx   . Stomach cancer Neg Hx     Past Medical History:  Diagnosis Date  . Allergy    RHINITIS  . Asthma   . Atrial fibrillation ([place])   . Diverticular hemorrhage   . Dyslipidemia   . GERD (gastroesophageal reflux disease)   . History of colonic polyps   . Hypertension   . Hypogonadism male   . Obesity   . Vitamin D deficiency     Patient Active Problem List   Diagnosis Date Noted  . Mild  persistent asthma without complication 83/15/1761  . Erectile dysfunction 12/12/2017  . History of adenomatous polyp of colon 09/18/2017  . Former smoker, stopped smoking in distant past 09/18/2017  . Brown tongue 05/31/2017  . History of diverticulosis 04/04/2016  . 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 Hypogonadism male 03/26/2012  . Obesity 10/08/2007  . Obstructive sleep apnea 10/08/2007  . Essential hypertension 10/08/2007  . Allergic rhinitis 10/08/2007  . History of  atrial fibrillation 10/08/2007    Past Surgical History:  Procedure Laterality Date  . COLONOSCOPY    . ENDOVENOUS ABLATION SAPHENOUS VEIN W/ LASER  02/2011   right leg  . LAPAROSCOPIC GASTRIC BANDING  2008   EARLE MD  . partial thryoidectomy      Current Outpatient Medications  Medication Sig Dispense Refill  . acyclovir (ZOVIRAX) 200 MG capsule TAKE 2 CAPSULES BY MOUTH TWICE DAILY FOR PREVENTION 360 capsule 3  . albuterol (VENTOLIN HFA) 108 (90 Base) MCG/ACT inhaler INHALE 2 PUFFS INTO THE LUNGS EVERY 6 HOURS AS NEEDED FOR WHEEZING 54 g 0  . aspirin 81 MG tablet Take 81 mg by mouth daily.      . Cobalamin Combinations (NEURIVA PLUS PO) Take by mouth.    . fluticasone furoate-vilanterol (BREO ELLIPTA) 200-25 MCG/INH AEPB Inhale 1 puff into the lungs daily. 3 each 3  . levocetirizine (XYZAL) 5 MG tablet Take 5 mg by mouth every evening.     Marland Kitchen losartan-hydrochlorothiazide (HYZAAR) 50-12.5 MG tablet Take 1 tablet by mouth daily. 90 tablet 3  . Multiple Vitamin (MULTIVITAMIN) tablet Take 1 tablet by mouth daily.     . Multiple Vitamins-Minerals (ICAPS AREDS 2 PO) Take by mouth.     . Testosterone (ANDROGEL PUMP) 20.25 MG/ACT (1.62%) GEL Place 2 Pump onto the skin daily. (Patient not taking: Reported on 11/25/2020) 75 g 5   No current facility-administered medications for this visit.    Allergies as of 11/25/2020 - Review Complete 11/25/2020  Allergen Reaction Noted  . Ace inhibitors Swelling 09/16/2020  .  Codeine Nausea And Vomiting     Vitals: BP 121/68   Pulse 62   Ht 6\' 1"  (1.854 m)   Wt 231 lb (104.8 kg)   BMI 30.48 kg/m  Last Weight:  Wt Readings from Last 1 Encounters:  11/25/20 231 lb (104.8 kg)   Last Height:   Ht Readings from Last 1 Encounters:  11/25/20 6\' 1"  (1.854 m)     Physical exam: Exam: Gen: NAD, conversant, well nourised, obese, well groomed                     CV: RRR, no MRG. No Carotid Bruits. No peripheral edema, warm, nontender Eyes: Conjunctivae clear without exudates or hemorrhage  Neuro: Detailed Neurologic Exam  Speech:    Speech is normal; fluent and spontaneous with normal comprehension.  Cognition:  MMSE - Mini Mental State Exam 11/25/2020 09/16/2020  Orientation to time 4 3  Orientation to Place 4 5  Registration 3 3  Attention/ Calculation 2 5  Recall 1 2  Language- name 2 objects 2 2  Language- repeat 1 1  Language- follow 3 step command 3 3  Language- read & follow direction 1 1  Write a sentence 1 1  Copy design 1 0  Total score 23 26       Cranial Nerves:    The pupils are equal, round, and reactive to light. Pupils too small to visualize fundi. Visual fields are full to threat. Extraocular movements are intact. Trigeminal sensation is intact and the muscles of mastication are normal. The face is symmetric. The palate elevates in the midline. Hearing intact. Voice is normal. Shoulder shrug is normal. The tongue has normal motion without fasciculations.   Coordination:    Normal finger to nose   Gait:    Normal native gait  Motor Observation:    No asymmetry, no atrophy, and no involuntary movements noted.  Tone:    Normal muscle tone.    Posture:    Posture is normal. normal erect    Strength:    Strength is V/V in the upper and lower limbs.      Sensation: intact to LT     Reflex Exam:  DTR's:    Deep tendon reflexes in the upper and lower extremities are symmetrical and normal bilaterally.   Toes:    The  toes are equiv bilaterally.   Clonus:    Clonus is absent.    Assessment/Plan:  64 y.o. male here as requested by Charles Lung, MD for memory difficulties.  Past medical history hypertension, obstructive sleep apnea, mild persistent asthma, brown tongue, hypogonadism, obesity status post surgery, diverticulosis, A. fib, former smoker.  Addendum 05/13/2021: He had his formal memory testing with Charles. Sima Matas. Started him on Aricept 5mg  because his pulse was 62 and wanted to make sure he didn't have bradycardia due to aricept. He is having a problem using his cpap and that is one of the things that Charles. Sima Matas recommended. By report, he was not appropriate for inspire and dental device not covered by insurance so I would like help with his cpap (he saw Charles. Brett Fairy for his sleep study). He is willing to follow up with Amy Lomax or Ward Givens.   - MRI of the brain w/wo contrast to look for reversible causes of dementia - Blood work - Formal memory testing - Sleep testing(ESS 11): can fall sleep "in a minute", Had sleep apnea in the past and lost weight but now feels more tired, still feels tired in the morning, can sit down on the couch and fall asleep anytime. Last sleep test > 10 years ago   Orders Placed This Encounter  Procedures  . MR BRAIN W WO CONTRAST  . Vitamin B12  . Methylmalonic acid, serum  . Homocysteine  . Basic Metabolic Panel  . Heavy metals, blood  . Ambulatory referral to Neuropsychology  . Ambulatory referral to Sleep Studies   Cc: Charles Lung, MD,  Charles Lung, MD  Sarina Ill, MD  [facility] [address] [place], [place] [address]  Phone [phone] Fax [phone]  Spent over 60 minutes of face-to-face and non-face-to-face time with patient on the  1. Memory loss   2. History of sleep apnea   3. Daytime somnolence   4. Confusion   5. Gets lost in familiar location   6. Forgetfulness   7. B12  deficiency    diagnosis.  This included previsit chart review, lab review, study review, order entry, electronic health record documentation, patient education on the different diagnostic and therapeutic options, counseling and coordination of care, risks and benefits of management, compliance, or risk factor reduction

## 2020-11-25 ENCOUNTER — Encounter: Payer: Self-pay | Admitting: Neurology

## 2020-11-25 ENCOUNTER — Ambulatory Visit: Payer: Managed Care, Other (non HMO) | Admitting: Neurology

## 2020-11-25 ENCOUNTER — Other Ambulatory Visit: Payer: Self-pay

## 2020-11-25 ENCOUNTER — Telehealth: Payer: Self-pay | Admitting: Neurology

## 2020-11-25 VITALS — BP 121/68 | HR 62 | Ht 73.0 in | Wt 231.0 lb

## 2020-11-25 DIAGNOSIS — R4189 Other symptoms and signs involving cognitive functions and awareness: Secondary | ICD-10-CM

## 2020-11-25 DIAGNOSIS — R413 Other amnesia: Secondary | ICD-10-CM | POA: Diagnosis not present

## 2020-11-25 DIAGNOSIS — Z8669 Personal history of other diseases of the nervous system and sense organs: Secondary | ICD-10-CM | POA: Diagnosis not present

## 2020-11-25 DIAGNOSIS — R410 Disorientation, unspecified: Secondary | ICD-10-CM

## 2020-11-25 DIAGNOSIS — R400 Somnolence: Secondary | ICD-10-CM

## 2020-11-25 DIAGNOSIS — R6889 Other general symptoms and signs: Secondary | ICD-10-CM

## 2020-11-25 DIAGNOSIS — E538 Deficiency of other specified B group vitamins: Secondary | ICD-10-CM

## 2020-11-25 NOTE — Patient Instructions (Addendum)
MRI of the brain w/wo contrast to look for reversible causes of dementia Blood work Formal memory testing Sleep testing   Vitamin B12 Deficiency Vitamin B12 deficiency occurs when the body does not have enough vitamin B12, which is an important vitamin. The body needs this vitamin:  To make red blood cells.  To make DNA. This is the genetic material inside cells.  To help the nerves work properly so they can carry messages from the brain to the body. Vitamin B12 deficiency can cause various health problems, such as a low red blood cell count (anemia) or nerve damage. What are the causes? This condition may be caused by:  Not eating enough foods that contain vitamin B12.  Not having enough stomach acid and digestive fluids to properly absorb vitamin B12 from the food that you eat.  Certain digestive system diseases that make it hard to absorb vitamin B12. These diseases include Crohn's disease, chronic pancreatitis, and cystic fibrosis.  A condition in which the body does not make enough of a protein (intrinsic factor), resulting in too few red blood cells (pernicious anemia).  Having a surgery in which part of the stomach or small intestine is removed.  Taking certain medicines that make it hard for the body to absorb vitamin B12. These medicines include: ? Heartburn medicines (antacids and proton pump inhibitors). ? Certain antibiotic medicines. ? Some medicines that are used to treat diabetes, tuberculosis, gout, or high cholesterol. What increases the risk? The following factors may make you more likely to develop a B12 deficiency:  Being older than age 74.  Eating a vegetarian or vegan diet, especially while you are pregnant.  Eating a poor diet while you are pregnant.  Taking certain medicines.  Having alcoholism. What are the signs or symptoms? In some cases, there are no symptoms of this condition. If the condition leads to anemia or nerve damage, various symptoms  can occur, such as:  Weakness.  Fatigue.  Loss of appetite.  Weight loss.  Numbness or tingling in your hands and feet.  Redness and burning of the tongue.  Confusion or memory problems.  Depression.  Sensory problems, such as color blindness, ringing in the ears, or loss of taste.  Diarrhea or constipation.  Trouble walking. If anemia is severe, symptoms can include:  Shortness of breath.  Dizziness.  Rapid heart rate (tachycardia). How is this diagnosed? This condition may be diagnosed with a blood test to measure the level of vitamin B12 in your blood. You may also have other tests, including:  A group of tests that measure certain characteristics of blood cells (complete blood count, CBC).  A blood test to measure intrinsic factor.  A procedure where a thin tube with a camera on the end is used to look into your stomach or intestines (endoscopy). Other tests may be needed to discover the cause of B12 deficiency. How is this treated? Treatment for this condition depends on the cause. This condition may be treated by:  Changing your eating and drinking habits, such as: ? Eating more foods that contain vitamin B12. ? Drinking less alcohol or no alcohol.  Getting vitamin B12 injections.  Taking vitamin B12 supplements. Your health care provider will tell you which dosage is best for you. Follow these instructions at home: Eating and drinking   Eat lots of healthy foods that contain vitamin B12, including: ? Meats and poultry. This includes beef, pork, chicken, [place], and organ meats, such as liver. ? Seafood. This includes  clams, rainbow trout, salmon, tuna, and haddock. ? Eggs. ? Cereal and dairy products that are fortified. This means that vitamin B12 has been added to the food. Check the label on the package to see if the food is fortified. The items listed above may not be a complete list of recommended foods and beverages. Contact a dietitian for more  information. General instructions  Get any injections that are prescribed by your health care provider.  Take supplements only as told by your health care provider. Follow the directions carefully.  Do not drink alcohol if your health care provider tells you not to. In some cases, you may only be asked to limit alcohol use.  Keep all follow-up visits as told by your health care provider. This is important. Contact a health care provider if:  Your symptoms come back. Get help right away if you:  Develop shortness of breath.  Have a rapid heart rate.  Have chest pain.  Become dizzy or lose consciousness. Summary  Vitamin B12 deficiency occurs when the body does not have enough vitamin B12.  The main causes of vitamin B12 deficiency include dietary deficiency, digestive diseases, pernicious anemia, and having a surgery in which part of the stomach or small intestine is removed.  In some cases, there are no symptoms of this condition. If the condition leads to anemia or nerve damage, various symptoms can occur, such as weakness, shortness of breath, and numbness.  Treatment may include getting vitamin B12 injections or taking vitamin B12 supplements. Eat lots of healthy foods that contain vitamin B12. This information is not intended to replace advice given to you by your health care provider. Make sure you discuss any questions you have with your health care provider. Document Revised: 05/31/2019 Document Reviewed: 08/21/2018 Elsevier Patient Education  [place].   Dementia Dementia is a condition that affects the way the brain functions. It often affects memory and thinking. Usually, dementia gets worse with time and cannot be reversed (progressive dementia). There are many types of dementia, including: Alzheimer's disease. This type is the most common. Vascular dementia. This type may happen as the result of a stroke. Lewy body dementia. This type may happen to people  who have Parkinson's disease. Frontotemporal dementia. This type is caused by damage to nerve cells (neurons) in certain parts of the brain. Some people may be affected by more than one type of dementia. This is called mixed dementia. What are the causes? Dementia is caused by damage to cells in the brain. The area of the brain and the types of cells damaged determine the type of dementia. Usually, this damage is irreversible or cannot be undone. Some examples of irreversible causes include: Conditions that affect the blood vessels of the brain, such as diabetes, heart disease, or blood vessel disease. Genetic mutations. In some cases, changes in the brain may be caused by another condition and can be reversed or slowed. Some examples of reversible causes include: Injury to the brain. Certain medicines. Infection, such as meningitis. Metabolic problems, such as vitamin B12 deficiency or thyroid disease. Pressure on the brain, such as from a tumor or blood clot. What are the signs or symptoms? Symptoms of dementia depend on the type of dementia. Common signs of dementia include problems with remembering, thinking, problem solving, decision making, and communicating. These signs develop slowly or get worse with time. This may include: Problems remembering things. Having trouble taking a bath or putting clothes on. Forgetting appointments. Forgetting to  pay bills. Difficulty planning and preparing meals. Having trouble speaking. Getting lost easily. How is this diagnosed? This condition is diagnosed by a specialist (neurologist). It is diagnosed based on the history of your symptoms, your medical history, a physical exam, and tests. Tests may include: Tests to evaluate brain function, such as memory tests, cognitive tests, and other tests. Lab tests, such as blood or urine tests. Imaging tests, such as a CT scan, a PET scan, or an MRI. Genetic testing. This may be done if other family members  have a diagnosis of certain types of dementia. Your health care provider will talk with you and your family, friends, or caregivers about your history and symptoms. How is this treated?  Treatment for this condition depends on the cause of the dementia. Progressive dementias, such as Alzheimer's disease, cannot be cured, but there may be treatments that help to manage symptoms. Treatment might involve taking medicines that may help to: Control the dementia. Slow down the progression of the dementia. Manage symptoms. In some cases, treating the cause of your dementia can improve symptoms, reverse symptoms, or slow down how quickly your dementia becomes worse. Your health care provider can direct you to support groups, organizations, and other health care providers who can help with decisions about your care. Follow these instructions at home: Medicines Take over-the-counter and prescription medicines only as told by your health care provider. Use a pill organizer or pill reminder to help you manage your medicines. Avoid taking medicines that can affect thinking, such as pain medicines or sleeping medicines. Lifestyle Make healthy lifestyle choices. Be physically active as told by your health care provider. Do not use any products that contain nicotine or tobacco, such as cigarettes, e-cigarettes, and chewing tobacco. If you need help quitting, ask your health care provider. Do not drink alcohol. Practice stress-management techniques when you get stressed. Spend time with other people. Make sure to get quality sleep. These tips can help you get a good night's rest: Avoid napping during the day. Keep your sleeping area dark and cool. Avoid exercising during the few hours before you go to bed. Avoid caffeine products in the evening. Eating and drinking Drink enough fluid to keep your urine pale yellow. Eat a healthy diet. General instructions  Work with your health care provider to  determine what you need help with and what your safety needs are. Talk with your health care provider about whether it is safe for you to drive. If you were given a bracelet that identifies you as a person with memory loss or tracks your location, make sure to wear it at all times. Work with your family to make important decisions, such as advance directives, medical power of attorney, or a living will. Keep all follow-up visits as told by your health care provider. This is important. Where to find more information Alzheimer's Association: [employer] National Institute on Aging: DVDEnthusiasts.nl World Health Organization: [employer]Link.com.br Contact a health care provider if: You have any new or worsening symptoms. You have problems with choking or swallowing. Get help right away if: You feel depressed or sad, or feel that you want to harm yourself. Your family members become concerned for your safety. If you ever feel like you may hurt yourself or others, or have thoughts about taking your own life, get help right away. You can go to your nearest emergency department or call: Your local emergency services (911 in the [place] A suicide crisis helpline, such as the National Suicide Prevention  Lif[employer] at 670-850-3933. This is open 24 hours a day. Summary Dementia is a condition that affects the way the brain functions. Dementia often affects memory and thinking. Usually, dementia gets worse with time and cannot be reversed (progressive dementia). Treatment for this condition depends on the cause of the dementia. Work with your health care provider to determine what you need help with and what your safety needs are. Your health care provider can direct you to support groups, organizations, and other health care providers who can help with decisions about your care. This information is not intended to replace advice given to you by your health care provider. Make sure you discuss any  questions you have with your health care provider. Document Revised: 02/26/2019 Document Reviewed: 02/26/2019 Elsevier Patient Education  [place].

## 2020-11-25 NOTE — Telephone Encounter (Signed)
[employer] order sent to [facility]. They will obtain the auth and reach out to the patient to schedule.  

## 2020-11-25 NOTE — Progress Notes (Signed)
Epworth Sleepiness Scale 0= would never doze 1= slight chance of dozing 2= moderate chance of dozing 3= high chance of dozing  Sitting and reading:3 Watching TV:3 Sitting inactive in a public place (ex. Theater or meeting):0 As a passenger in a car for an hour without a break:1 Lying down to rest in the afternoon:3 Sitting and talking to someone:0 Sitting quietly after lunch (no alcohol):1 In a car, while stopped in traffic:0 Total:11

## 2020-11-29 LAB — BASIC METABOLIC PANEL
BUN/Creatinine Ratio: 15 (ref 10–24)
BUN: 17 mg/dL (ref 8–27)
CO2: 27 mmol/L (ref 20–29)
Calcium: 9.2 mg/dL (ref 8.6–10.2)
Chloride: 100 mmol/L (ref 96–106)
Creatinine, Ser: 1.17 mg/dL (ref 0.76–1.27)
GFR calc Af Amer: 76 mL/min/{1.73_m2} (ref >59)
GFR calc non Af Amer: 65 mL/min/{1.73_m2} (ref >59)
Glucose: 90 mg/dL (ref 65–99)
Potassium: 4.2 mmol/L (ref 3.5–5.2)
Sodium: 139 mmol/L (ref 134–144)

## 2020-11-29 LAB — HOMOCYSTEINE: Homocysteine: 14.5 umol/L (ref 0.0–17.2)

## 2020-11-29 LAB — VITAMIN B12: Vitamin B-12: 379 pg/mL (ref 232–1245)

## 2020-11-29 LAB — METHYLMALONIC ACID, SERUM: Methylmalonic Acid: 401 nmol/L — ABNORMAL HIGH (ref 0–378)

## 2020-11-29 LAB — HEAVY METALS, BLOOD
Arsenic: 2 ug/L (ref 2–23)
Lead, Blood: <1 ug/dL (ref 0–4)
Mercury: <1 ug/L (ref 0.0–14.9)

## 2020-12-03 ENCOUNTER — Encounter: Payer: Self-pay | Admitting: Psychology

## 2020-12-06 ENCOUNTER — Other Ambulatory Visit: Payer: Self-pay

## 2020-12-06 ENCOUNTER — Ambulatory Visit
Admission: RE | Admit: 2020-12-06 | Discharge: 2020-12-06 | Disposition: A | Discharge status: Home / Self Care | Payer: Managed Care, Other (non HMO) | Source: Ambulatory Visit | Attending: Neurology | Admitting: Neurology

## 2020-12-06 ENCOUNTER — Encounter: Payer: Self-pay | Admitting: Family Medicine

## 2020-12-06 DIAGNOSIS — R4189 Other symptoms and signs involving cognitive functions and awareness: Secondary | ICD-10-CM

## 2020-12-06 DIAGNOSIS — E291 Testicular hypofunction: Secondary | ICD-10-CM

## 2020-12-06 DIAGNOSIS — R413 Other amnesia: Secondary | ICD-10-CM

## 2020-12-06 DIAGNOSIS — R6889 Other general symptoms and signs: Secondary | ICD-10-CM

## 2020-12-06 DIAGNOSIS — R410 Disorientation, unspecified: Secondary | ICD-10-CM

## 2020-12-06 MED ORDER — GADOBENATE DIMEGLUMINE 529 MG/ML IV SOLN
20.0000 mL | Freq: Once | INTRAVENOUS | Status: AC | PRN
  Administered 2020-12-06: 20 mL via INTRAVENOUS

## 2020-12-07 ENCOUNTER — Telehealth: Payer: Self-pay | Admitting: *Deleted

## 2020-12-07 ENCOUNTER — Telehealth: Payer: Self-pay | Admitting: Family Medicine

## 2020-12-07 MED ORDER — TESTOSTERONE 20.25 MG/ACT (1.62%) TD GEL
2.0000 | Freq: Every day | TRANSDERMAL | 5 refills | Status: DC
Start: 2020-12-07 — End: 2021-11-12

## 2020-12-07 MED ORDER — BREO ELLIPTA 200-25 MCG/INH IN AEPB: 1.0000 | INHALATION_SPRAY | Freq: Every day | RESPIRATORY_TRACT | 3 refills | Status: DC

## 2020-12-07 NOTE — Telephone Encounter (Addendum)
Spoke with patient and discussed results. He also stated he had seen the MRI brain results on mychart. Pt's questions were answered. He has the sleep consult and neurocognitive testing appt scheduled already. Pt verbalized appreciation for the call.   ----- Message from Melvenia Beam, MD sent at 12/06/2020  7:29 PM EST ----- MRI of the brain looks fine for age. This does not rule out memory problems or dementia however so please make sure you go to the neurocognitive testing and the sleep consult. Thanks, Dr. Jaynee Eagles

## 2020-12-07 NOTE — Telephone Encounter (Signed)
[facility] req Breo Ellipta Inh with 3 refills   200/25 mcg 90 day supply

## 2020-12-10 ENCOUNTER — Encounter: Payer: 59 | Admitting: Family Medicine

## 2021-01-04 ENCOUNTER — Institutional Professional Consult (permissible substitution): Payer: Managed Care, Other (non HMO) | Admitting: Neurology

## 2021-01-14 ENCOUNTER — Ambulatory Visit: Payer: Managed Care, Other (non HMO) | Admitting: Neurology

## 2021-01-14 ENCOUNTER — Encounter: Payer: Self-pay | Admitting: Neurology

## 2021-01-14 VITALS — BP 124/82 | HR 62 | Ht 73.0 in | Wt 228.0 lb

## 2021-01-14 DIAGNOSIS — Z8679 Personal history of other diseases of the circulatory system: Secondary | ICD-10-CM

## 2021-01-14 DIAGNOSIS — G4733 Obstructive sleep apnea (adult) (pediatric): Secondary | ICD-10-CM | POA: Diagnosis not present

## 2021-01-14 DIAGNOSIS — Z9884 Bariatric surgery status: Secondary | ICD-10-CM

## 2021-01-14 DIAGNOSIS — J309 Allergic rhinitis, unspecified: Secondary | ICD-10-CM | POA: Diagnosis not present

## 2021-01-14 DIAGNOSIS — Z9989 Dependence on other enabling machines and devices: Secondary | ICD-10-CM | POA: Insufficient documentation

## 2021-01-14 DIAGNOSIS — G4734 Idiopathic sleep related nonobstructive alveolar hypoventilation: Secondary | ICD-10-CM | POA: Diagnosis not present

## 2021-01-14 DIAGNOSIS — R413 Other amnesia: Secondary | ICD-10-CM

## 2021-01-14 NOTE — Patient Instructions (Signed)

## 2021-01-14 NOTE — Progress Notes (Signed)
SLEEP MEDICINE CLINIC    Provider:  Melvyn Novas, MD  Primary Care Physician:  Ronnald Nian, MD [address] [place] [address]     Referring Provider: Ronnald Nian, Md [address] [place],  [place] [address]          Chief Complaint according to patient   Patient presents with:    . New Patient (Initial Visit)           HISTORY OF PRESENT ILLNESS:  [name] is a 65 y.o. Caucasian male patient seen here upon Dr. Trevor Mace referral on 01/14/2021 .  Chief concern according to patient :  I had 2 sleep studies, one about the year 2004 at [facility] long - and soon after he underwent bariatric surgery ( lap band_- shedding 100 pounds) and then , after weight loss, another HST, which is in Epic and did no longer indicate clinically significant Apnea. I have the pleasure of seeing [name] on 01-14-2021, a right -handed  Caucasian male with a possible sleep disorder.  She has been diagnosed with memory loss, and  [name] is concerned about a possible sleep overlap. HE  has a past medical history of Allergy, Asthma, Atrial fibrillation (HCC), Diverticular hemorrhage, Dyslipidemia, GERD (gastroesophageal reflux disease),  Lap band surgery( 2006) History of colonic polyps, Hypertension, Hypogonadism male, Obesity, and Vitamin D deficiency.     Sleep relevant medical history: Nocturia only once- not even every night, only mild -moderate snoring, not waking him, no sleep talking. 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 He used inhalers for COPD, is a former smoker- and has been treated for HTN. He developed oral swelling on some medications ( ACE ? ) after eating out- BBQ. Family medical /sleep history: no other family member on CPAP with OSA, insomnia, sleep walkers. Mother was an alcoholic.    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  The patient currently works in daytime but used to work night shifts 20 years ago- Tobacco use- history of 10 pack years.  ETOH use - social ,  Caffeine intake in form of Coffee( /) Soda( 2-3 day) Tea ( /) or energy drinks. Regular exercise in form of yard work.   Hobbies :  Yard work      Sleep habits are as follows: The patient's dinner time is between 6-8  PM. The patient goes to bed between 10-12 PM and based on his TV program at night-  continues to sleep for 6-8 hours, wakes some nights  for 1 bathroom break.  The preferred sleep position is sideways or supine with the support of 2 pillows.  Dreams are reportedly frequent/vivid. 6 AM is the usual rise time. The patient wakes up spontaneously between 5 and 6 AM , leaves the house at 7 and returns at 5.30 PM.  He reports mostly feeling refreshed and restored in AM, denies symptoms such as morning headaches, but sinus irritation, dry mouth. Naps are taken frequently, not scheduled, he dozes off at home after work, in front of the TV,  lasting from 10-35 minutes and are more refreshing than nocturnal sleep.    Review of Systems: Out of a complete 14 system review, the patient complains of only  the following symptoms, and all other reviewed systems are negative.:    How likely are you to doze in the following situations: 0 = not likely, 1 = slight chance, 2 = moderate chance, 3 = high chance   Sitting and Reading? Watching Television? Sitting inactive in a public place (theater or meeting)? As a passenger in a car for an hour without a break? Lying down in the afternoon when circumstances permit? Sitting and talking to someone? Sitting quietly after lunch without alcohol? In a car, while stopped for a few minutes in traffic?   Total = 5/ 24 points   FSS endorsed at 17/ 63 points.   Social History    Socioeconomic History  . Marital status: Married    Spouse name: [name]  . Number of children: 1  . Years of education: Not on file  . Highest education level: Associate degree: occupational, Hotel manager, or vocational program  Occupational History    Comment: [name]  Tobacco Use  . Smoking status: Former Smoker    Types: Cigarettes    Quit date: 12/26/1997    Years since quitting: 23.0  . Smokeless tobacco: Never Used  Substance and Sexual Activity  . Alcohol use: Yes    Alcohol/week: 1.0 standard drink    Types: 1 Glasses of wine per week    Comment: Monthly  . Drug use: No  . Sexual activity: Yes  Other Topics Concern  . Not on file  Social History Narrative   Lives with wife, 1 son and 1 daughter deceased   Sodas, 3 a day   Social Determinants of Health   Financial Resource Strain: Not on file  Food Insecurity: Not on file  Transportation Needs: Not on file  Physical Activity: Not on file  Stress: Not on file  Social Connections: Not on file    Family History  Problem Relation Age of Onset  . Arthritis Mother   . Hypertension Mother   . Ulcers Mother   . Breast cancer Mother   . Emphysema Father   . Asthma Maternal Aunt   . Heart disease Maternal Uncle   . Stroke Maternal Grandmother   . Heart disease Paternal Grandmother   . Colon cancer Neg Hx   . Stomach cancer Neg Hx     Past Medical History:  Diagnosis Date  . Allergy    RHINITIS  . Asthma   . Atrial fibrillation ([place])   . Diverticular hemorrhage   . Dyslipidemia   . GERD (gastroesophageal reflux disease)   . History of colonic polyps   . Hypertension   . Hypogonadism male   . Obesity   . Vitamin D deficiency     Past Surgical History:  Procedure Laterality Date  . COLONOSCOPY    . ENDOVENOUS ABLATION SAPHENOUS VEIN W/ LASER  02/2011   right leg  . LAPAROSCOPIC GASTRIC BANDING  2008   EARLE MD  . partial thryoidectomy       Current Outpatient Medications on File  Prior to Visit  Medication Sig Dispense Refill  . acyclovir (ZOVIRAX) 200 MG capsule TAKE 2 CAPSULES BY MOUTH TWICE DAILY FOR PREVENTION 360 capsule 3  . albuterol (VENTOLIN HFA) 108 (90 Base) MCG/ACT inhaler INHALE 2 PUFFS INTO THE LUNGS EVERY 6 HOURS AS NEEDED FOR WHEEZING 54 g 0  . aspirin 81 MG tablet Take 81 mg by mouth daily.    . Cobalamin Combinations (NEURIVA PLUS PO) Take by mouth.    . Cyanocobalamin (VITAMIN  B 12 PO) Take 2,000 mcg by mouth daily.    . fluticasone furoate-vilanterol (BREO ELLIPTA) 200-25 MCG/INH AEPB Inhale 1 puff into the lungs daily. 3 each 3  . levocetirizine (XYZAL) 5 MG tablet Take 5 mg by mouth every evening.     Marland Kitchen losartan-hydrochlorothiazide (HYZAAR) 50-12.5 MG tablet Take 1 tablet by mouth daily. 90 tablet 3  . Multiple Vitamin (MULTIVITAMIN) tablet Take 1 tablet by mouth daily.     . Multiple Vitamins-Minerals (ICAPS AREDS 2 PO) Take by mouth.     . Testosterone (ANDROGEL PUMP) 20.25 MG/ACT (1.62%) GEL Place 2 Pump onto the skin daily. 75 g 5   No current facility-administered medications on file prior to visit.    Allergies  Allergen Reactions  . Ace Inhibitors Swelling  . Codeine Nausea And Vomiting    Physical exam:  Today's Vitals   01/14/21 0830  BP: 124/82  Pulse: 62  Weight: 228 lb (103.4 kg)  Height: 6\' 1"  (1.854 m)   Body mass index is 30.08 kg/m.   Wt Readings from Last 3 Encounters:  01/14/21 228 lb (103.4 kg)  11/25/20 231 lb (104.8 kg)  09/16/20 214 lb 6.4 oz (97.3 kg)     Ht Readings from Last 3 Encounters:  01/14/21 6\' 1"  (1.854 m)  11/25/20 6\' 1"  (1.854 m)  09/16/20 6' (1.829 m)      General: The patient is awake, alert and appears not in acute distress. The patient is well groomed. Head: Normocephalic, atraumatic. Neck is supple. Mallampati 1  neck circumference:16.5 inches . Nasal airflow  patent.  Retrognathia is clearly seen. facila hair Dental status: crowded Cardiovascular:  Regular rate and cardiac rhythm  by pulse,  without distended neck veins. Respiratory: Lungs are clear to auscultation.  Skin:  Without evidence of ankle edema, or rash. Trunk: The patient's posture is erect.   Neurologic exam : The patient is awake and alert, oriented to place and time.   Memory subjective described as intact.  Attention span & concentration ability appears normal.  Speech is fluent,  without  dysarthria, dysphonia or aphasia.  Mood and affect are appropriate.   Cranial nerves: no loss of smell or taste reported -  Pupils are equal and briskly reactive to light. Funduscopic exam deferred..  Extraocular movements in vertical and horizontal planes were intact and without nystagmus. No Diplopia. Visual fields by finger perimetry are intact. Hearing was intact to soft voice and finger rubbing.    Facial sensation intact to fine touch.  Facial motor strength is symmetric and tongue and uvula move midline.  Neck ROM : rotation, tilt and flexion extension were normal for age and shoulder shrug was symmetrical.    Motor exam:  Symmetric bulk, tone and ROM.   Normal tone without cog wheeling, symmetric grip strength .   Sensory:  Fine touch and vibration were reduced in the ankles and bolow, he reports cold feet.  Proprioception tested in the upper extremities was normal.   Coordination: Rapid alternating movements in the fingers/hands were of normal speed.  The Finger-to-nose maneuver was intact without evidence of ataxia, dysmetria or tremor.   Gait and station: Patient could rise unassisted from a seated position, walked without assistive device.  Stance is of normal width/ base and the patient turned with 3 steps.  Toe and heel walk were deferred.  Deep tendon reflexes: in the  upper and lower extremities are symmetric and intact.  Babinski response was deferred .  After spending a total time of  45 minutes face to face and additional time for physical and neurologic examination, review of  laboratory studies,  personal review of imaging studies, reports and results of other testing and review of referral information / records as far as provided in visit, I have established the following assessments:  1)  The patient needs a screen for OSA, he has no complaints of severe hypersomnia, and he reports good quality sleep, has no orthopnea.  2)  Memory loss has progressed over 1-2 years. He reports difficulties with names, sometimes with orientation.  3)  Long, long time ago had gone through a period of substance abuse/ overuse/ and he has had no relapse.  4) history of hypoxemia, apnea and atrial fib before weight loss.    My Plan is to proceed with:  1)  HST or PSG to screen for atrial  fib, hypoxemia and for apnea.    I would like to thank [name] and [name], [name],  [facility] [address] for allowing me to meet with and to take care of this pleasant patient.   In short, Jary Louvier is presenting with memory loss, difficulties with word-finding, and loosing train of thought.  I plan to follow up either personally or through our NP within 3 month if there is a finding on his sleep test. .    Electronically signed by: Larey Seat, MD 01/14/2021 8:57 AM  [facility] and [facility] Sleep Board certified by The [employer] of Sleep Medicine and Diplomate of the [employer] of Sleep Medicine. Board certified In Neurology through the [employer], Fellow of the [employer] of Neurology. Medical Director of [employer].

## 2021-02-08 ENCOUNTER — Ambulatory Visit (INDEPENDENT_AMBULATORY_CARE_PROVIDER_SITE_OTHER): Payer: Managed Care, Other (non HMO) | Admitting: Neurology

## 2021-02-08 DIAGNOSIS — G4734 Idiopathic sleep related nonobstructive alveolar hypoventilation: Secondary | ICD-10-CM

## 2021-02-08 DIAGNOSIS — Z8679 Personal history of other diseases of the circulatory system: Secondary | ICD-10-CM

## 2021-02-08 DIAGNOSIS — J309 Allergic rhinitis, unspecified: Secondary | ICD-10-CM

## 2021-02-08 DIAGNOSIS — G4733 Obstructive sleep apnea (adult) (pediatric): Secondary | ICD-10-CM

## 2021-02-10 NOTE — Progress Notes (Signed)
   [place] TEST ( HST by Watch PAT)  STUDY DATE: loaded data on 02-10-2021  DOB: [birth year]  MRN: [id number]  ORDERING CLINICIAN: Asencion Partridge Dohmeier,MD   REFERRING CLINICIAN: Sarina Ill, MD  CLINICAL INFORMATION/HISTORY: have the pleasure of seeing [name] on 01-14-2021,a right -handed  Caucasian male, who has been diagnosed with memory loss, and Dr. Jaynee Eagles is concerned about a possible sleep disorder overlapping. He has a  medical history of Allergy, Asthma, Atrial fibrillation ([place]), Diverticular hemorrhage, Dyslipidemia, GERD (gastroesophageal reflux disease),  Lap band surgery( 2006) History of colonic polyps, Hypertension, Hypogonadism male, Obesity, and Vitamin D deficiency.  Epworth sleepiness score: 5/24.  BMI: 31.6 kg/m  FINDINGS:  Total Record Time (hours, min): 7h 16mins Total Sleep Time (hours, min):  7h 3 mins   Percent REM (%): 23.4   Calculated pAHI (per hour): 14.0     REM pAHI: 9.8     NREM pAHI:15.2   Oxygen Saturation (%) Mean: 93 Minimum oxygen saturation ( nadir in %):  83  O2Saturation (minutes) <=88%: 3.3   Pulse Mean (bpm):  56  Pulse Range: 44 - 150   IMPRESSION: This HST found mild OSA (obstructive sleep apnea) at an AHI of 14/h , clinically insignificant hypoxia and normal pulse rate. There was no REM sleep exacerbation and a NREM sleep dominant apnea can indicate central apnea. The AHI was greatly dependent on sleep position, supine AHI was 14.2, non supine AHI was 5.7/h.    RECOMMENDATION: The patient should avoid the supine sleep position. This mild sleep apnea can be treated with CPAP, dental device or Inspire device. Given the history of hypoxemia before surgical weight loss, his history of atrial fib , I would recommend a CPAP trial.  Auto CPAP at 6-16 cm water, 3 cm EPR and mask of patients choice. Heated humidification.   INTERPRETING PHYSICIAN:  Larey Seat, MD  Medical Director of [facility] Sleep  Board  Certified in Neurology and Sleep Medicine

## 2021-02-16 ENCOUNTER — Encounter: Payer: Self-pay | Admitting: Neurology

## 2021-02-16 NOTE — Progress Notes (Signed)
IMPRESSION: This HST found mild OSA (obstructive sleep apnea) at an AHI of 14/h , clinically insignificant hypoxia and normal pulse rate. There was no REM sleep exacerbation and a NREM sleep dominant apnea can indicate central apnea. The AHI was greatly dependent on sleep position, supine AHI was 14.2, non supine AHI was 5.7/h.    RECOMMENDATION: The patient should avoid the supine sleep position. This mild sleep apnea can be treated with CPAP, dental device or Inspire device. Given the history of hypoxemia before surgical weight loss, his history of atrial fib , I would recommend a CPAP trial.  Auto CPAP at 6-16 cm water, 3 cm EPR and mask of patients choice. Heated humidification.   INTERPRETING PHYSICIAN:  Larey Seat, MD  Medical Director of [facility] Sleep  Board Certified in Neurology and Sleep Medicine

## 2021-02-16 NOTE — Procedures (Signed)
[place] TEST ( HST by Watch PAT)  STUDY DATE: loaded data on 02-10-2021  DOB: Apr 08, 1956  MRN: [id number]  ORDERING CLINICIAN: Asencion Partridge Kambria Grima,MD   REFERRING CLINICIAN: Sarina Ill, MD  CLINICAL INFORMATION/HISTORY: have the pleasure of seeing [name] on 01-14-2021,a right -handed  Caucasian male, who has been diagnosed with memory loss, and Dr. Jaynee Eagles is concerned about a possible sleep disorder overlapping. He has a  medical history of Allergy, Asthma, Atrial fibrillation ([place]), Diverticular hemorrhage, Dyslipidemia, GERD (gastroesophageal reflux disease),  Lap band surgery( 2006) History of colonic polyps, Hypertension, Hypogonadism male, Obesity, and Vitamin D deficiency.  Epworth sleepiness score: 5/24.  BMI: 31.6 kg/m  FINDINGS:  Total Record Time (hours, min): 7h 72mins Total Sleep Time (hours, min):  7h 3 mins   Percent REM (%): 23.4   Calculated pAHI (per hour): 14.0     REM pAHI: 9.8     NREM pAHI:15.2   Oxygen Saturation (%) Mean: 93 Minimum oxygen saturation ( nadir in %):  83  O2Saturation (minutes) <=88%: 3.3   Pulse Mean (bpm):  56  Pulse Range: 44 - 150   IMPRESSION: This HST found mild OSA (obstructive sleep apnea) at an AHI of 14/h , clinically insignificant hypoxia and normal pulse rate. There was no REM sleep exacerbation and a NREM sleep dominant apnea can indicate central apnea. The AHI was greatly dependent on sleep position, supine AHI was 14.2, non supine AHI was 5.7/h.    RECOMMENDATION: The patient should avoid the supine sleep position. This mild sleep apnea can be treated with CPAP, dental device or Inspire device. Given the history of hypoxemia before surgical weight loss, his history of atrial fib , I would recommend a CPAP trial.  Auto CPAP at 6-16 cm water, 3 cm EPR and mask of patients choice. Heated humidification.   INTERPRETING PHYSICIAN:  Larey Seat, MD  Medical Director of [facility] Sleep  Board Certified in  Neurology and Sleep Medicine

## 2021-02-16 NOTE — Addendum Note (Signed)
Addended by: Larey Seat on: 02/16/2021 12:57 PM   Modules accepted: Orders

## 2021-02-17 ENCOUNTER — Telehealth: Payer: Self-pay | Admitting: Neurology

## 2021-02-17 DIAGNOSIS — Z8669 Personal history of other diseases of the nervous system and sense organs: Secondary | ICD-10-CM

## 2021-02-17 DIAGNOSIS — R413 Other amnesia: Secondary | ICD-10-CM

## 2021-02-17 DIAGNOSIS — Z789 Other specified health status: Secondary | ICD-10-CM

## 2021-02-17 NOTE — Telephone Encounter (Signed)
Called the patient and was able to review the sleep study results in detail. Informed the patient there was mild to moderate sleep apnea present. Informed the patient Dr. Brett Fairy would recommend treatment with an auto CPAP. Patient has already tried that in the past and does not want to do that again. He is interested after discussing dental device and inspire, and going the inspire out. Advised that what we do this will place a referral to ENT and they will further evaluate if he is a candidate for the procedure. Patient verbalized understanding and had no further questions.

## 2021-02-17 NOTE — Telephone Encounter (Signed)
-----   Message from Larey Seat, MD sent at 02/16/2021 12:57 PM EST ----- IMPRESSION: This HST found mild OSA (obstructive sleep apnea) at an AHI of 14/h , clinically insignificant hypoxia and normal pulse rate. There was no REM sleep exacerbation and a NREM sleep dominant apnea can indicate central apnea. The AHI was greatly dependent on sleep position, supine AHI was 14.2, non supine AHI was 5.7/h.    RECOMMENDATION: The patient should avoid the supine sleep position. This mild sleep apnea can be treated with CPAP, dental device or Inspire device. Given the history of hypoxemia before surgical weight loss, his history of atrial fib , I would recommend a CPAP trial.  Auto CPAP at 6-16 cm water, 3 cm EPR and mask of patients choice. Heated humidification.   INTERPRETING PHYSICIAN:  Larey Seat, MD  Medical Director of [facility] Sleep  Board Certified in Neurology and Sleep Medicine

## 2021-03-02 ENCOUNTER — Other Ambulatory Visit: Payer: Self-pay

## 2021-03-02 ENCOUNTER — Encounter: Payer: Managed Care, Other (non HMO) | Attending: Psychology | Admitting: Psychology

## 2021-03-02 DIAGNOSIS — R413 Other amnesia: Secondary | ICD-10-CM | POA: Diagnosis present

## 2021-03-09 ENCOUNTER — Telehealth: Payer: Self-pay

## 2021-03-09 ENCOUNTER — Other Ambulatory Visit: Payer: Self-pay

## 2021-03-09 DIAGNOSIS — Z8619 Personal history of other infectious and parasitic diseases: Secondary | ICD-10-CM

## 2021-03-09 MED ORDER — LOSARTAN POTASSIUM-HCTZ 50-12.5 MG PO TABS: 1.0000 | ORAL_TABLET | Freq: Every day | ORAL | 1 refills | Status: DC

## 2021-03-09 MED ORDER — ACYCLOVIR 200 MG PO CAPS: ORAL_CAPSULE | ORAL | 1 refills | Status: DC

## 2021-03-09 NOTE — Telephone Encounter (Signed)
error 

## 2021-03-22 ENCOUNTER — Telehealth: Payer: Self-pay

## 2021-03-22 DIAGNOSIS — J4520 Mild intermittent asthma, uncomplicated: Secondary | ICD-10-CM

## 2021-03-22 MED ORDER — ALBUTEROL SULFATE HFA 108 (90 BASE) MCG/ACT IN AERS: INHALATION_SPRAY | RESPIRATORY_TRACT | 1 refills | Status: DC

## 2021-03-22 NOTE — Telephone Encounter (Signed)
Received [facility] from [facility] for a refill on the pts. Albuterol inhaler pt. Last apt 09/16/20 and next apt is 10/08/21.

## 2021-03-22 NOTE — Addendum Note (Signed)
Addended by: Denita Lung on: 03/22/2021 11:28 AM   Modules accepted: Orders

## 2021-03-23 NOTE — Progress Notes (Signed)
Neuropsychological Consultation   Patient:   [name]   DOB:   12/13/1956  MR Number:  [id number]  Location:  [facility] [place], [place] [id number] [address] [place] [address] Dept: [phone]           Date of Service:   03/02/2021  Start Time:   8 AM End Time:   10 AM  Today's visit was an in person visit that was conducted in my outpatient clinic office.  The patient, his wife and myself were present for this clinical interview.  1 hour 15 minutes were spent in the face-to-face portion of this interview and the other 45 minutes were spent with records review, report writing and testing protocol establishment.  Provider/Observer:  Ilean Skill, Psy.D.       Clinical Neuropsychologist       Billing Code/Service: 96116/96121  Chief Complaint:    [name] is a 65 year old Caucasian male that was referred for neuropsychological evaluation by Sarina Ill, MD as part of the larger neurological work-up requested by the patient's PCP Jill Alexanders, MD. the patient has complaints of memory and learning difficulties, some mild word finding difficulties and issues with maintaining his train of thought and attention/concentration.  The patient reports he has difficulty remembering the name of objects as well as people's names.  He also reports that he has difficulty remembering what to do in his primary work activity utilizing auto CAD but denies other changes cognitively.  Reason for Service:  [name] is a 65 year old Caucasian male that was referred for neuropsychological evaluation by Sarina Ill, MD as part of the larger neurological work-up requested by the patient's PCP Jill Alexanders, MD. the patient has complaints of memory and learning difficulties, some mild word finding difficulties and issues with maintaining his train of thought and attention/concentration.   The patient reports he has difficulty remembering the name of objects as well as people's names.  He also reports that he has difficulty remembering what to do in his primary work activity utilizing auto CAD but denies other changes cognitively.  The patient reports that he started noticing changes in cognitive functioning at his work initially 2 years ago.  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  The patient reports that he cannot remember procedures and operations within his work duties and he will need cueing to remember how to navigate the software and other job duties.  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  The patient reports that he feels like it has been progressively worsening over the past 2 years.  He reports that people that he should no call and he cannot remember their names or what company they were before.  The patient's wife reports that over the past 2 to 3 years that the patient started to forget things such as regular appointments or daily activities.  She reports she has noticed him having a hard time remembering a wide range of issues.  She reports a particular incident where started to panic when getting lost in his thoughts and has missed a turn and then had a 3 to 5-minute period where he just "blanked out" and panicked and did not know where he was.  This potentially  could have been an example of a panic attack.  His wife reports that there may have been more than 1 other panic attack prior to this event.  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  He reports that there were times where he used other solvents as well in his occupation.  The patient denies any  tremors and denies any change in his fine motor control.  Previous neurological work-up has also not shown any changes in fine motor control or proprioception or changes in tactile sensation.  The patient describes his sleep patterns as going to bed between 9 and 11 PM and getting up at 6 AM.  The patient reports he does generally feel rested in the morning and follows a regular sleep pattern for work.  The patient is wife reports that he has been snoring again like he had in the past.  The patient describes a normal appetite with no change.  The patient reports that the only major stressors he is facing is changes in his job situation with his company that he would work for being bought by another company and his impending retirement as well as changes in sexual functioning which he is being followed for separately.  When asking about the patient's family's history he reports that he was never really close with his family.  His mom consumed alcohol and died when she was 65 years old and patient reports he does not know her side of the family.  The patient reports that from what he knows his father died in his 57s but does not know much of his life either.  The patient had an MRI completed on 12/06/2020 at the request of Dr. Jaynee Eagles.  Interpretation/impression is derived by Charles Colt, MD, PhD included the impression of mild generalized cortical atrophy, scattered T2/flair hyperintense foci consistent with mild chronic microvascular ischemic change.  There were no acute findings noted.  These are likely consistent with age-related changes with no significant abnormalities found in no acute changes noted.  The patient is also scheduled for assessment for sleep apnea with Daymon Larsen, MD.  The patient was diagnosed with sleep apnea 10 years ago.  The patient had been diagnosed with sleep apnea prior to gastric/bariatric procedures and significant weight loss.  After significant weight loss he had a  follow-up sleep study that showed significant improvement in sleep apnea symptoms.  The patient reports that he used a CPAP device for approximately 1 year and found using the CPAP device was "irritating to no End."  However, he used it until his significant weight loss which he has maintained.  The patient and his wife report that snoring has returned and there is concern about sleep apnea being a medical illness and possible component of his memory and cognitive changes.  Behavioral Observation: [name]  presents as a 65 y.o.-year-old Right handed Caucasian Male who appeared his stated age. his dress was Appropriate and he was Well Groomed and his manners were Appropriate to the situation.  his participation was indicative of Appropriate and Attentive behaviors.  There were not physical disabilities noted.  he displayed an appropriate level of cooperation and motivation.     Interactions:    Active Appropriate  Attention:   within normal limits and attention span and concentration were age appropriate  Memory:   abnormal; remote memory intact, recent memory impaired  Visuo-spatial:  not examined  Speech (Volume):  normal  Speech:   normal; normal although the patient does report some word finding difficulties none were clearly demonstrated today and there were no indications of circumlocutions or paraphasic errors noted.  Thought Process:  Coherent and Relevant  Though Content:  WNL; not suicidal and not homicidal  Orientation:   person, place, time/date and situation  Judgment:   Good  Planning:   Fair  Affect:    Anxious  Mood:    Anxious  Insight:   Good  Intelligence:   high  Marital Status/Living: The patient was born and raised in [place] along with 1 sibling.  Developmental milestones were reached at the appropriate time.  The patient continues to live with his wife of 30 years.  The patient had 1 marriage previously for 10 years.  The patient has  had 5 children total.  2 of his children are deceased and he has 1 daughter and 2 stepdaughters.  All of the children are fine with the exception of a daughter with bulimia.  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  Past Employment:  Previously the patient worked as a Energy manager.  Hobbies and interests include fixing things, electrical work and Therapist, music work.  Substance Use:  The patient reports only occasional alcohol use and denies any tobacco or other substance use.  Education:   The patient has completed two 2-year degrees through [employer] in the electrical field.  He maintained a 3.88 GPA throughout and always did well in these educational endeavors.  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  Extracurricular activities through high school included football.  Medical History:   Past Medical History:  Diagnosis Date  . Allergy    RHINITIS  . Asthma   . Atrial fibrillation ([place])   . Diverticular hemorrhage   . Dyslipidemia   . GERD (gastroesophageal reflux disease)   . History of colonic polyps   . Hypertension   . Hypogonadism male   . Obesity   . Vitamin D deficiency          Patient Active Problem List   Diagnosis Date Noted  . Bariatric surgery status 01/14/2021  . Chronic intermittent hypoxia with obstructive sleep apnea 01/14/2021  . Memory difficulties 01/14/2021  . Mild persistent asthma without complication 62/70/3500  . Erectile dysfunction 12/12/2017  . History of adenomatous polyp of colon 09/18/2017  . Former smoker, stopped smoking in distant past 09/18/2017  . Brown tongue 05/31/2017  . History of diverticulosis 04/04/2016  . 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 Hypogonadism male 03/26/2012  . Obesity 10/08/2007  . Obstructive sleep apnea 10/08/2007   . Essential hypertension 10/08/2007  . Allergic rhinitis 10/08/2007  . History of atrial fibrillation 10/08/2007    Psychiatric History:  While the patient has no formal prior psychiatric history patient has been very stressed by recent changes and difficulties with memory and functioning.  During the clinical interview today there were descriptions of approximately 2 events per the patient potentially had panic events when he had episodes of extended confusion and disorientation.  Family Med/Psych History:  Family History  Problem Relation Age of Onset  . Arthritis Mother   . Hypertension Mother   . Ulcers Mother   . Breast cancer Mother   . Emphysema Father   . Asthma  Maternal Aunt   . Heart disease Maternal Uncle   . Stroke Maternal Grandmother   . Heart disease Paternal Grandmother   . Colon cancer Neg Hx   . Stomach cancer Neg Hx     Impression/DX:  Giordano Getman is a 65 year old Caucasian male that was referred for neuropsychological evaluation by Sarina Ill, MD as part of the larger neurological work-up requested by the patient's PCP Jill Alexanders, MD. the patient has complaints of memory and learning difficulties, some mild word finding difficulties and issues with maintaining his train of thought and attention/concentration.  The patient reports he has difficulty remembering the name of objects as well as people's names.  He also reports that he has difficulty remembering what to do in his primary work activity utilizing auto CAD but denies other changes cognitively.  Disposition/Plan:  We have set the patient up for formal neuropsychological testing and will utilize a foundational battery consisting of the Wechsler Adult Intelligence Scale-for an Wechsler Memory Scale/IV.  We will also look at expressive language functioning and word finding abilities as well with particular focus on attentional components within the battery and memory components.  Once that is completed a  formal report will be produced for his referring physicians and made available in his EMR as well as setting up the patient for formal feedback session.  Diagnosis:    Memory loss         Electronically Signed   _______________________ Ilean Skill, Psy.D. Clinical Neuropsychologist

## 2021-03-26 ENCOUNTER — Other Ambulatory Visit: Payer: Self-pay

## 2021-03-26 ENCOUNTER — Encounter: Payer: Managed Care, Other (non HMO) | Attending: Psychology | Admitting: Psychology

## 2021-03-26 ENCOUNTER — Encounter: Payer: Self-pay | Admitting: Psychology

## 2021-03-26 ENCOUNTER — Telehealth: Payer: Self-pay

## 2021-03-26 DIAGNOSIS — R413 Other amnesia: Secondary | ICD-10-CM | POA: Diagnosis not present

## 2021-03-26 DIAGNOSIS — G300 Alzheimer's disease with early onset: Secondary | ICD-10-CM | POA: Diagnosis present

## 2021-03-26 DIAGNOSIS — F0280 Dementia in other diseases classified elsewhere without behavioral disturbance: Secondary | ICD-10-CM | POA: Diagnosis present

## 2021-03-26 NOTE — Telephone Encounter (Signed)
Recv'd letter from [place] stating Albuterol HFA will no longer be covered.  Called [facility] and states albuterol HFA 90 MCG was shipped on 03/22/21 & went thru insurance for $0 co pay

## 2021-03-26 NOTE — Progress Notes (Addendum)
Neuropsychology Note  [name] completed 240 minutes of neuropsychological testing with this provider Health and safety inspector). He appeared tall and overweight. He was well groomed and dressed. There were no physical disabilities noted related to gait and he ambulated without difficulty. Wore contact lenses. Vision and hearing were adequate for the purposes of testing. Mild word finding difficulties noted. Affect was blunted. Thought process was logical but concrete. He displayed good attention and appeared highly motivated. The patient did not appear overtly distressed by the testing session, per behavioral observation or via self-report. Rest breaks were offered.    Clinical Decision Making: In considering the patient's current level of functioning, level of presumed impairment, nature of symptoms, emotional and behavioral responses during the interview, level of literacy, and observed level of motivation/effort, a battery of tests was selected. Changes were made as deemed necessary based on patient performance on testing, behavioral observations and additional pertinent factors such as those listed above.  Tests Administered:  [place] (BNT)  Controlled Oral Word Association Test (COWAT; FAS & Animals)  Trail Making Test (TMT)  Wechsler Adult Intelligence Scale, 4th Edition (WAIS-IV)   Wechsler Memory Scale, (WMS-IV): Adult Battery  Results     Raw Score  T-Score Percentile  Description   BNT  Total:   54/60   45   31   Average     COWAT  FAS:   31   42  21   Below Average  Animals: 17   46  34   Average     TMT  Part A:  27"   54  66   Average   Part B:  99"   43  25   Average  *Took around 15-20 seconds to find number 3 on test.   WAIS-IV   Composite Score Summary  Scale Sum of Scaled Scores Composite Score Percentile Rank 95% Conf. Interval Qualitative Description  Verbal Comprehension 30 VCI 100 50 94-106 Average  Perceptual Reasoning 32 PRI 104 61  98-110 Average  Working Memory 21 WMI 102 55 95-109 Average  Processing Speed 13 PSI 81 10 75-91 Low Average  Full Scale 96 FSIQ 97 42 93-101 Average  General Ability 62 GAI 101 53 96-106 Average   Verbal Comprehension Subtests Summary  Subtest Raw Score Scaled Score Percentile Rank Reference Group Scaled Score SEM  Similarities 23 9 37 9 1.08  Vocabulary 37 10 50 10 0.73  Information 17 11 63 12 0.67   Perceptual Reasoning Subtests Summary  Subtest Raw Score Scaled Score Percentile Rank Reference Group Scaled Score SEM  Block Design 43 12 75 9 1.04  Matrix Reasoning 17 11 63 9 0.95  Visual Puzzles 12 9 37 8 0.99   Working Doctor, general practice Raw Score Scaled Score Percentile Rank Reference Group Scaled Score SEM  Digit Span 31 12 75 11 0.85  Arithmetic 13 9 37 9 1.04   Working Memory Process Score Summary  Process Score Raw Score Scaled Score Percentile Rank Base Rate SEM  Digit Span Forward 11 11 63 -- 1.44  Digit Span Backward 10 12 75 -- 1.27  Digit Span Sequencing 10 12 75 -- 1.37  Longest Digit Span Forward 7 -- -- 48.0 --  Longest Digit Span Backward 6 -- -- 23.5 --  Longest Digit Span Sequence 6 -- -- 63.0 --   Processing Speed Subtests Summary  Subtest Raw Score Scaled Score Percentile Rank Reference Group Scaled Score SEM  Symbol Search 19  6 9 5  1.31  Coding 42 7 16 5  0.99    WMS-IV Adult Battery  Brief Cognitive Status Exam Classification  Age Years of Education Raw Score Classification Level Base Rate  64 years 8 months 14 43 Low 3.5    Index Score Summary  Index Sum of Scaled Scores Index Score Percentile Rank 95% Confidence Interval Qualitative Descriptor  Auditory Memory (AMI) 16 64 1 60-72 Extremely Low  Visual Memory (VMI) 21 71 3 67-78 Borderline  Visual Working Memory (VWMI) 17 91 27 84-99 Average  Immediate Memory (IMI) 21 69 2 64-77 Extremely Low  Delayed Memory (DMI) 16 60 0.4 56-69 Extremely Low   Primary Subtest Scaled  Score Summary  Subtest Domain Raw Score Scaled Score Percentile Rank  Logical Memory I AM 16 6 9   Logical Memory II AM 9 5 5   Verbal Paired Associates I AM 10 4 2   Verbal Paired Associates II AM 0 1 0.1  Designs I VM 42 4 2  Designs II VM 34 5 5  Visual Reproduction I VM 28 7 16   Visual Reproduction II VM 6 5 5   Spatial Addition VWM 8 8 25   Symbol Span VWM 19 9 37   Auditory Memory Process Score Summary  Process Score Raw Score Scaled Score Percentile Rank Cumulative Percentage (Base Rate)  LM II Recognition 26 - - >75%  VPA II Recognition 31 - - 3-9%   Visual Memory Process Score Summary  Process Score Raw Score Scaled Score Percentile Rank Cumulative Percentage (Base Rate)  DE I Content 31 8 25  -  DE I Spatial 9 4 2  -  DE II Content 23 6 9  -  DE II Spatial 9 8 25  -  DE II Recognition 12 - - 17-25%  VR II Recognition 4 - - 17-25%   ABILITY-MEMORY ANALYSIS  Ability Score:  GAI: 101 Date of Testing:  WAIS-IV; WMS-IV 2021/03/26  Predicted Difference Method   Index Predicted WMS-IV Index Score Actual WMS-IV Index Score Difference Critical Value  Significant Difference Y/N Base Rate  Auditory Memory 101 64 37 8.95 Y <1%  Visual Memory 101 71 30 8.82 Y <1%  Visual Working Memory 101 91 10 11.24 N   Immediate Memory 101 69 32 10.35 Y <1%  Delayed Memory 101 60 41 10.08 Y <1%  Statistical significance (critical value) at the .01 level.    Feedback With Patient:  [name] will return 05/20/21 on a for an interactive feedback session with Dr. Sima Matas at which time his test performances, clinical impressions and treatment recommendations will be reviewed in detail. The patient understands he can contact our office should he require our assistance before this time.  Full report to follow.  *NOTE Initial Consultation note dated 03/02/21 in EMR.

## 2021-04-15 ENCOUNTER — Encounter: Payer: Self-pay | Admitting: Psychology

## 2021-04-15 ENCOUNTER — Encounter (HOSPITAL_BASED_OUTPATIENT_CLINIC_OR_DEPARTMENT_OTHER): Payer: Managed Care, Other (non HMO) | Admitting: Psychology

## 2021-04-15 ENCOUNTER — Other Ambulatory Visit: Payer: Self-pay

## 2021-04-15 DIAGNOSIS — R413 Other amnesia: Secondary | ICD-10-CM | POA: Diagnosis not present

## 2021-04-15 DIAGNOSIS — F0280 Dementia in other diseases classified elsewhere without behavioral disturbance: Secondary | ICD-10-CM | POA: Diagnosis not present

## 2021-04-15 DIAGNOSIS — G300 Alzheimer's disease with early onset: Secondary | ICD-10-CM | POA: Diagnosis not present

## 2021-04-15 NOTE — Progress Notes (Signed)
Neuropsychological Evaluation   Patient:  [name]   DOB: 12/03/1956  MR Number: [id number]  Location: [facility] PHYSICAL MEDICINE AND REHABILITATION [place], [id number] [place] [id number] Dept: [phone]  Start: 10 AM End: 11 AM  Provider/Observer:     Edgardo Roys PsyD  Chief Complaint:      Chief Complaint  Patient presents with  . Memory Loss    Reason For Service:     [name] is a 65 year old Caucasian male that was referred for neuropsychological evaluation by Sarina Ill, MD as part of the larger neurological work-up requested by the patient's PCP Jill Alexanders, MD.  The patient has complaints of memory and learning difficulties, some mild word finding difficulties and issues with maintaining his train of thought and attention/concentration.  The patient reports he has difficulty remembering the name of objects as well as people's names.  He also reports that he has difficulty remembering what to do in his primary work activity utilizing auto CAD but denies other changes cognitively.  The patient reports that he started noticing changes in cognitive functioning at his work initially 2 years ago.  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  He reports that his wife is also started noticing difficulties.  The patient reports that he cannot remember procedures and operations within his work duties and he will need cueing to remember how to navigate the software and other job duties.  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  The patient reports that he feels like it has been progressively worsening over the past 2 years.  He reports that people that he should know when they call that he cannot remember  their names or what company they work for.  The patient's wife reports that over the past 2 to 3 years that the patient started to forget things such as regular appointments or daily activities.  She reports she has noticed him having a hard time remembering a wide range of issues.  She reports a particular incident where the patient started to panic when getting lost in his thoughts and has missed a turn and then had a 3 to 5-minute period where he just "blanked out" and panicked and did not know where he was.  This potentially could have been an example of a panic attack.  His wife reports that there may have been more than 1 other panic attack prior to this event.  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  He reports that there were times where he used other solvents as well in his occupation.  The patient denies any tremors and denies any change in his fine motor control.  Previous neurological work-up has also not shown any changes in fine motor control or proprioception or changes in tactile sensation.  The patient describes his sleep patterns as going to bed between 9 and 11 PM and getting up at 6 AM.  The patient reports he does generally feel rested in the morning and follows a regular sleep pattern for work.    The patient's wife reports that he has been snoring again like he had in the past.  The patient describes a normal appetite with no change.  The patient reports that the only major stressors he is facing is changes in his job situation with his company that he would work for being bought by another company and his impending retirement as well as changes in sexual functioning which he is being followed for separately.  When asking about the patient's family's history, he reports that he was never really close with his family.  His mom consumed alcohol and died when she was  65 years old and patient reports he does not know her side of the family.  The patient reports that from what he knows his father died in his 15s but does not know much of his life either.  The patient had an MRI completed on 12/06/2020 at the request of Dr. Jaynee Eagles.  Interpretation/impression is derived by Arlice Colt, MD, PhD included the impression of mild generalized cortical atrophy, scattered T2/flair hyperintense foci consistent with mild chronic microvascular ischemic change.  There were no acute findings noted.  These are likely consistent with age-related changes with no significant abnormalities found in no acute changes noted.  The patient is also scheduled for assessment for sleep apnea with Daymon Larsen, MD.  The patient was diagnosed with sleep apnea 10 years ago.  The patient had been diagnosed with sleep apnea prior to gastric/bariatric procedures and significant weight loss.  After significant weight loss he had a follow-up sleep study that showed significant improvement in sleep apnea symptoms.  The patient reports that he used a CPAP device for approximately 1 year and found using the CPAP device was "irritating to no End."  However, he used it until his significant weight loss, which he has maintained.  The patient and his wife report that snoring has returned and there is concern about sleep apnea being a medical illness and possible component of his memory and cognitive changes.  Additional information since initial clinical interview on 03/02/2021 regarding sleep apnea:  The patient had home sleep study conducted that did show ongoing issues with regard to sleep apnea although they were generally mild in nature.  He is seen twice Unice Bailey, MD through [facility] at the Ortho laryngology clinic.  The patient has described difficulty with using CPAP in the past and is never been able to tolerate using the mask.  He reported that he was never able to use the mask and able to get  to sleep.  He had requested the review to investigate the new hypoglossal nerve stimulator.  His physician felt that he did not qualify for the device due to his AHI of 14 and that he consider an oral appliance and gave him the name of a local dentist to fashion him to see if it helped with sleep apnea.  The patient is not using a CPAP now.  Neuropsychology Note  Toben Acuna completed 240 minutes of neuropsychological testing with this provider Health and safety inspector). He appeared tall and overweight. He was well groomed and dressed. There wereno physical disabilities notedrelated to gait and he ambulated without difficulty. Wore contact lenses. Vision and hearing were adequate for the purposes of testing. Mild word finding difficulties noted. Affect was blunted. Thought process was logical but concrete. He displayed good attention and appeared highly motivated. The patient did not appear overtly distressed by the testing session, per behavioral observation or via self-report. Rest breaks were offered.    Clinical Decision Making: In considering the patient's current level of functioning, level of  presumed impairment, nature of symptoms, emotional and behavioral responses during the interview, level of literacy, and observed level of motivation/effort, a battery of tests was selected. Changes were made as deemed necessary based on patient performance on testing, behavioral observations and additional pertinent factors such as those listed above.  Tests Administered:  [place] (BNT)  Controlled Oral Word Association Test (COWAT; FAS & Animals)  Trail Making Test (TMT)  Wechsler Adult Intelligence Scale, 4th Edition (WAIS-IV)   Wechsler Memory Scale, (WMS-IV): Adult Battery  Results                                      Raw Score                  T-Score           Percentile                   Description   BNT             Total:               54/60                            45                    31                                Average                                      COWAT             FAS:                31                                42                    21                                Below Average             Animals:          17                                46                    34                                Average                             TMT             Part A:  27"                               54                    66                                Average                         Part B:             99"                               43                    25                                Average  *Took around 15-20 seconds to find number 3 on test.   WAIS-IV            Composite Score Summary   Scale Sum of Scaled Scores Composite Score Percentile Rank 95% Conf. Interval Qualitative Description  Verbal Comprehension 30 VCI 100 50 94-106 Average  Perceptual Reasoning 32 PRI 104 61 98-110 Average  Working Memory 21 WMI 102 55 95-109 Average  Processing Speed 13 PSI 81 10 75-91 Low Average  Full Scale 96 FSIQ 97 42 93-101 Average  General Ability 62 GAI 101 53 96-106 Average          Verbal Comprehension Subtests Summary   Subtest Raw Score Scaled Score Percentile Rank Reference Group Scaled Score SEM  Similarities 23 9 37 9 1.08  Vocabulary 37 10 50 10 0.73  Information 17 11 63 12 0.67          Perceptual Reasoning Subtests Summary   Subtest Raw Score Scaled Score Percentile Rank Reference Group Scaled Score SEM  Block Design 43 12 75 9 1.04  Matrix Reasoning 17 11 63 9 0.95  Visual Puzzles 12 9 37 8 0.99          Working Print production planner Raw Score Scaled Score Percentile Rank Reference Group Scaled Score SEM  Digit Span 31 12 75 11 0.85  Arithmetic 13 9 37 9 1.04          Processing Speed Subtests Summary   Subtest Raw Score Scaled Score Percentile Rank Reference Group  Scaled Score SEM  Symbol Search 19 6 9 5  1.31  Coding 42 7 16 5  1.61    WMS-IV Adult Battery          Index Score Summary   Index Sum of Scaled Scores Index Score Percentile Rank 95% Confidence Interval Qualitative Descriptor  Auditory Memory (AMI) 16 64 1 60-72 Extremely Low  Visual Memory (VMI) 21 71 3 67-78 Borderline  Visual Working Memory (VWMI) 17 91 27 84-99 Average  Immediate Memory (IMI) 21 69 2 64-77 Extremely Low  Delayed Memory (DMI) 16 60 0.4 56-69 Extremely Low         Primary Subtest Scaled Score Summary   Subtest Domain Raw Score Scaled Score Percentile Rank  Logical Memory I AM  16 6 9   Logical Memory II AM 9 5 5   Verbal Paired Associates I AM 10 4 2   Verbal Paired Associates II AM 0 1 0.1  Designs I VM 42 4 2  Designs II VM 34 5 5  Visual Reproduction I VM 28 7 16   Visual Reproduction II VM 6 5 5   Spatial Addition VWM 8 8 25   Symbol Span VWM 19 9 37         Auditory Memory Process Score Summary   Process Score Raw Score Scaled Score Percentile Rank Cumulative Percentage (Base Rate)  LM II Recognition 26 - - >75%  VPA II Recognition 31 - - 3-9%   ABILITY-MEMORY ANALYSIS  Ability Score:    GAI: 101 Date of Testing:           WAIS-IV; WMS-IV 2021/03/26           Predicted Difference Method   Index Predicted WMS-IV Index Score Actual WMS-IV Index Score Difference Critical Value  Significant Difference Y/N Base Rate  Auditory Memory 101 64 37 8.95 Y <1%  Visual Memory 101 71 30 8.82 Y <1%  Visual Working Memory 101 91 10 11.24 N   Immediate Memory 101 69 32 10.35 Y <1%  Delayed Memory 101 60 41 10.08 Y <1%  Statistical significance (critical value) at the .01 level.     Test Results:   Initially, an estimation was made as to historical/premorbid intellectual and cognitive functioning.  Based on the patient's educational history of completed high school and completed technical degree in the electrical field in conjunction with his  long occupational history working as an Advice worker it is estimated that the patient should conservatively of fall within the average to upper end of the average range with regard to global intellectual and cognitive abilities with likely relative strengths following in the visual-spatial/visual analysis areas as well as attentional and focus execute abilities and overall speed of information processing.  To assess the patient's expressive language capacity as he has reported difficulties recalling names of individuals and reporting some mild word finding difficulties the patient was administered both verbal fluency test as well as cued/targeted word finding measures.  The patient performed in the average range on measures of cued/targeted naming abilities in general did fairly well on overall verbal fluency and free/uncued generation of words.  There were some mild weaknesses on the FAS test but animal naming was within normal limits and it does not appear to be any significant expressive language difficulties beyond the reported subjective symptoms of mild word finding difficulties.  The initial test battery that was administered included the Wechsler Adult Intelligence Scale-IV.  We initially calculated global cognitive functioning measures including full-scale IQ score and general abilities IQ scores.  The patient produced a full-scale IQ score of 97 which falls at the 42nd percentile and is in the average range.  This is slightly below predicted levels based on education occupational history.  We also calculated the patient's general abilities index score which places less emphasis on measures that are more acute potential for change including measures of auditory encoding and information processing speed.  The patient produced a general abilities index score of 101 which is in the average range and is generally consistent with predicted levels.  The  patient produced a verbal comprehension index score of 100 which falls at the 50th percentile and is in the average range with individual subtest making up this measure also in  the average range suggesting well-maintained verbal reasoning and problem-solving skills maintenance of his vocabulary knowledge and general fund of information.  There were no particular deficits or losses noted with regard to verbal comprehension abilities.  The patient produced a perceptual reasoning index score of 104 which falls in the average range relative to a normative population.  Subtest making up this measure were also it in the average range with relative strengths in the areas of visual analysis and organization and visual-spatial abilities.  The patient also did well on measures of visual reasoning and problem solving and had some relative difficulties but still within normal limits regarding visual judgment and estimation.  The patient produced a working memory index score of 102 which falls at the 55th percentile and is in the average range.  The patient did well on measures of auditory encoding and processing but had some mild relative difficulties with regard to multi processing and manipulation of that initially encoded information.  The patient produced a processing speed index score of 81 which falls at the 10th percentile and is in the low average range.  This is his lowest composite score and likely represents a significant change from historical/premorbid functioning.  The patient had significant difficulties with regard to visual scanning, visual searching and overall speed of mental operations.  We also gave the patient the Trail Making Test part a and B which looks that these factors as well as measures assessing his cognitive shifting ability when information processing is required.  The patient did better on these measures although still below what would be expected.  While his processing speed was within  average limits the patient also was able to show good shifting of attention and therefore his weakness is primarily appear to be related to visual scanning and searching abilities and the overall speed of information processing.  The patient was also administered the Wechsler Memory Scale-IV to get a thorough and structured objective assessment of memory and learning capacities.  Initially, we looked at both auditory and visual encoding capacities as they are the underlying prerequisites for effective learning and memory.  On the Wechsler Adult Intelligence Scale, the patient showed average performance with regard to auditory encoding abilities and performed at the 75th percentile with regard to primary auditory encoding on these measures.  On the Wechsler Memory Scale measures we looked at visual encoding and the patient also performed in the average range with regard to visual encoding capacity although it was not quite as efficient as auditory encoding.  The patient produced a symbol span scaled score of 9 which fell at the 37th percentile.  While this is slightly below what would be expected given the patient's historical occupational pattern and abilities it still was within normal limits relative to a normative population.  Breaking memory components down between auditory versus visual memory the patient produced an auditory memory index score of 64 which fell at the 1st percentile relative to a normative population and was in the significantly impaired (extremely low range) relative to a normative population and represents a significant level of impairment with regard to auditory memory.  The patient produced a visual memory index score of 71 which fell at the 3rd percentile and is in the borderline range relative to a normative population.  This is a significantly impaired score and clearly well below what would be expected based on his lifelong education working history.  We also broke memory  functions down between immediate versus delayed memory.  The  patient produced an immediate memory index score of 69 which fell at the 2nd percentile and was in the extremely low range.  The patient's delayed memory was also significantly impaired as he produced a delayed memory index score of 60 which fell below the 1st percentile and also in the extremely low range.  Recognition formats only showed mild improvement in performance but he did improve somewhat.  These performances on recognition/cued recall were still below what would be expected and therefore his memory difficulties are likely to involve not only retrieval difficulties but also difficulties with storage and organization of information.  Cueing helped to some degree but not significantly.  Calculating the patient's ability-memory analysis will be utilized the patient's general abilities index score from the Wechsler Memory Scale's producing a predicted score for various memory indices and then comparing his actual scores highlight this significant level of impairments for both auditory and visual memory and deficits for immediate learning and memory versus delayed.  While the patient's visual working memory was somewhat below predicted levels it was still not significant.  This pattern clearly highlights the nature that patient is having adequate auditory and visual encoding abilities but is having severe difficulties with storage and organization of new information as well as poor free recall and retrieval of information with only mild improvements under recognition format.  Impression/Diagnosis:   Overall, the results of the current neuropsychological evaluation taking into account the patient's subjective reports, relevant medical and imaging information as well as objective neuropsychological cognitive performance do suggest significant changes and difficulties.  The patient's subjective reports that include difficulty recalling people's names  and other factual information about them, mild word finding difficulties and significant memory deficits.  The patient's medical and neurological information include a past history of sleep apnea with prescription for CPAP device but then significant weight loss and no longer using the CPAP device and now reports of increased snoring and other difficulties with recent home sleep test showing mild sleep apnea.  The patient has not started using a CPAP device again as he had difficulty with compliance and not being able to sleep when he used the headgear.  The patient requested assessment to see whether the new neurostimulator device would be appropriate for him but the level of sleep apneas that he has are likely not to qualify him for such a device.  The patient also has had recent MRI studies done that only showed mild generalized cortical atrophy and some mild chronic microvascular ischemic changes but nothing well outside of normal age-related findings.  Metabolically speaking the patient is doing fairly well on laboratory work-ups and most of these laboratory readings were within normal limits.  While his B12 levels were in the low normal range he did have significant elevations in methylmalonic acid which can be very sensitive marker of B12 deficiency.  Therefore there are still some concerns of B12 deficiencies which could be playing a role in his memory and cognitive difficulties.  While the patient has some history of working around solvents they were mostly gasoline or kerosene used as part of cleaning chemicals.  The patient's cognitive testing and symptoms are not generally consistent with deficits that would be primary related to excessive solvent exposure and therefore this does not appear to be a particularly significant contributor.  Objective neuropsychological test show that he is generally maintaining global cognitive functioning regarding verbal knowledge and verbal reasoning abilities.  The  patient is maintaining visual spatial and visual reasoning abilities as  well and both auditory and visual encoding appear to be generally well suggesting attentional components are being well maintained.  However, the patient is shown significant deficits with regard to overall information processing speed and visual scanning and visual searching capacity.  The patient is also showing significant and severe memory and learning deficits for both auditory and visual memory components with auditory deficits greater than visual.  These deficits were found for both immediate and delayed recall and memory with only mild improvements with regard to cueing and recognition formats.  The cognitive deficits noted showed significant reduction in information processing speed and significant memory and learning deficits that are primarily related to impaired storage and organization of information with good encoding abilities and only mild difficulties with regard to free recall of actual stored information.  As far as diagnostic considerations, I do think that the objective cognitive deficits are beyond those that could be predicted solea with regard to his mild sleep apnea and lack of CPAP usage although this is potentially playing a role.  There is concern around the patient's processing and absorption of B12 and even though his actual B12 levels were not significantly low he showed other findings and concerns around B12 processing.  Even with those, the level of cognitive deficits assessed with regard to information processing speed and memory are quite significant severe and the patient describes a progressive change over the past year or 2.  The patient does not show patterns consistent with deficits associated with significant solvent exposure.  Symptoms and objective neuropsychological findings are consistent with significant temporal lobe and white matter involvement and to a much lesser extent frontal or parietal lobe  involvement.  As far as diagnostic considerations I do think that the patient potentially is showing signs consistent with early stages of Alzheimer's type dementia as we cannot fully explain the progressive memory loss and changes solely on cerebrovascular changes, sleep apnea or metabolic concerns.  However, this is still quite preliminary as this is the initial objective assessment although the patient does describe consistent subjective symptoms as well that have progressively changed over the past 2 or 3 years.  While the patient is microvascular ischemic changes noted on his MRI were not significant there are some cognitive changes that would be consistent with a vascular mediated change related to small vessel disease particular around his significant changes in information processing speed.  We will need to retest to completely differentiate between these 2 possible diagnostic considerations.  We should continue to look at these other variables including concerns around B12 and while the patient has been referred to a dentist for an oral device to help with reducing apneic events I would strongly encourage the patient to give a CPAP device another try as his sleep apneas while mild could be playing a role and if those are addressed he may experience some improvement in memory and cognition.  As the concern around a possible early onset dementia of the Alzheimer's type is present we will need to reassess the patient in approximately 9 to 12 months to assess for any progressive nature to his deficits for more definitive diagnostic considerations around this condition.  In the meantime efforts should be made to try to address any other possible components and the patient may benefit for medication trials aimed at helping his memory and cognition.  I will sit down with the patient and his wife regarding the results of the current neuropsychological evaluation and go over specific recommendations about further  assessments as well as strategies for dressings very important life decisions particularly around his ongoing employment and future planning.  We have an appointment scheduled for this feedback for 05/20/2021.  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 Clinical Neuropsychologist

## 2021-05-11 ENCOUNTER — Encounter: Payer: Managed Care, Other (non HMO) | Attending: Psychology | Admitting: Psychology

## 2021-05-11 ENCOUNTER — Other Ambulatory Visit: Payer: Self-pay

## 2021-05-11 DIAGNOSIS — G300 Alzheimer's disease with early onset: Secondary | ICD-10-CM | POA: Diagnosis present

## 2021-05-11 DIAGNOSIS — F0280 Dementia in other diseases classified elsewhere without behavioral disturbance: Secondary | ICD-10-CM | POA: Diagnosis present

## 2021-05-11 DIAGNOSIS — R413 Other amnesia: Secondary | ICD-10-CM | POA: Diagnosis present

## 2021-05-12 ENCOUNTER — Encounter: Payer: Self-pay | Admitting: Psychology

## 2021-05-12 NOTE — Progress Notes (Signed)
05/11/2021: 9 AM-10 AM: Today's visit was an in person visit that was conducted in my outpatient clinic office with the patient, his wife and myself present.  It was 1 hour in duration.  Today I provided feedback regarding the results of the recent neuropsychological evaluation.  I will provide a copy of the impression/summary below for convenience and the patient's full report can be found in his EMR dated 04/15/2021 with the report from the clinical interpretation initially found in his EMR dated 03/02/2021.  We went over the results of the current neuropsychological evaluation, which was rather emotional with the patient and answered any questions that he had and addressed concerns.  We will need to do follow-up testing in approximately 9 months for more definitive diagnostic considerations.  I reviewed recommendations going forward for the patient between now and then and the patient will be following up with his neurologist Dr. Jaynee Eagles for any medication management or other medical interventions.    Impression/Diagnosis:                     Overall, the results of the current neuropsychological evaluation taking into account the patient's subjective reports, relevant medical and imaging information as well as objective neuropsychological cognitive performance do suggest significant changes and difficulties.  The patient's subjective reports that include difficulty recalling people's names and other factual information about them, mild word finding difficulties and significant memory deficits.  The patient's medical and neurological information include a past history of sleep apnea with prescription for CPAP device but then significant weight loss and no longer using the CPAP device and now reports of increased snoring and other difficulties with recent home sleep test showing mild sleep apnea.  The patient has not started using a CPAP device again as he had difficulty with compliance and not being able to sleep  when he used the headgear.  The patient requested assessment to see whether the new neurostimulator device would be appropriate for him but the level of sleep apneas that he has are likely not to qualify him for such a device.  The patient also has had recent MRI studies done that only showed mild generalized cortical atrophy and some mild chronic microvascular ischemic changes but nothing well outside of normal age-related findings.  Metabolically speaking the patient is doing fairly well on laboratory work-ups and most of these laboratory readings were within normal limits.  While his B12 levels were in the low normal range he did have significant elevations in methylmalonic acid which can be very sensitive marker of B12 deficiency.  Therefore there are still some concerns of B12 deficiencies which could be playing a role in his memory and cognitive difficulties.  While the patient has some history of working around solvents they were mostly gasoline or kerosene used as part of cleaning chemicals.  The patient's cognitive testing and symptoms are not generally consistent with deficits that would be primary related to excessive solvent exposure and therefore this does not appear to be a particularly significant contributor.  Objective neuropsychological test show that he is generally maintaining global cognitive functioning regarding verbal knowledge and verbal reasoning abilities.  The patient is maintaining visual spatial and visual reasoning abilities as well and both auditory and visual encoding appear to be generally well suggesting attentional components are being well maintained.  However, the patient is shown significant deficits with regard to overall information processing speed and visual scanning and visual searching capacity.  The patient is also showing  significant and severe memory and learning deficits for both auditory and visual memory components with auditory deficits greater than visual.  These  deficits were found for both immediate and delayed recall and memory with only mild improvements with regard to cueing and recognition formats.  The cognitive deficits noted showed significant reduction in information processing speed and significant memory and learning deficits that are primarily related to impaired storage and organization of information with good encoding abilities and only mild difficulties with regard to free recall of actual stored information.  As far as diagnostic considerations, I do think that the objective cognitive deficits are beyond those that could be predicted solea with regard to his mild sleep apnea and lack of CPAP usage although this is potentially playing a role.  There is concern around the patient's processing and absorption of B12 and even though his actual B12 levels were not significantly low he showed other findings and concerns around B12 processing.  Even with those, the level of cognitive deficits assessed with regard to information processing speed and memory are quite significant severe and the patient describes a progressive change over the past year or 2.  The patient does not show patterns consistent with deficits associated with significant solvent exposure.  Symptoms and objective neuropsychological findings are consistent with significant temporal lobe and white matter involvement and to a much lesser extent frontal or parietal lobe involvement.  As far as diagnostic considerations I do think that the patient potentially is showing signs consistent with early stages of Alzheimer's type dementia as we cannot fully explain the progressive memory loss and changes solely on cerebrovascular changes, sleep apnea or metabolic concerns.  However, this is still quite preliminary as this is the initial objective assessment although the patient does describe consistent subjective symptoms as well that have progressively changed over the past 2 or 3 years.  While the  patient is microvascular ischemic changes noted on his MRI were not significant there are some cognitive changes that would be consistent with a vascular mediated change related to small vessel disease particular around his significant changes in information processing speed.  We will need to retest to completely differentiate between these 2 possible diagnostic considerations.  We should continue to look at these other variables including concerns around B12 and while the patient has been referred to a dentist for an oral device to help with reducing apneic events I would strongly encourage the patient to give a CPAP device another try as his sleep apneas while mild could be playing a role and if those are addressed he may experience some improvement in memory and cognition.  As the concern around a possible early onset dementia of the Alzheimer's type is present we will need to reassess the patient in approximately 9 to 12 months to assess for any progressive nature to his deficits for more definitive diagnostic considerations around this condition.  In the meantime efforts should be made to try to address any other possible components and the patient may benefit for medication trials aimed at helping his memory and cognition.  I will sit down with the patient and his wife regarding the results of the current neuropsychological evaluation and go over specific recommendations about further assessments as well as strategies for dressings very important life decisions particularly around his ongoing employment and future planning.  We have an appointment scheduled for this feedback for 05/20/2021.  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 Clinical Neuropsychologist

## 2021-05-13 ENCOUNTER — Other Ambulatory Visit: Payer: Self-pay | Admitting: Neurology

## 2021-05-13 DIAGNOSIS — R413 Other amnesia: Secondary | ICD-10-CM

## 2021-05-13 MED ORDER — DONEPEZIL HCL 5 MG PO TABS: 5.0000 mg | ORAL_TABLET | Freq: Every day | ORAL | 2 refills | Status: DC

## 2021-05-19 ENCOUNTER — Ambulatory Visit: Payer: Managed Care, Other (non HMO) | Admitting: Psychology

## 2021-05-20 ENCOUNTER — Ambulatory Visit: Payer: Managed Care, Other (non HMO) | Admitting: Psychology

## 2021-06-01 NOTE — Patient Instructions (Addendum)
Below is our plan:  We will continue donepezil for now. Continue to monitor concerns of dreams. If these are bothersome, we will discontinue donepezil.   Please make sure you are staying well hydrated. I recommend 50-60 ounces daily. Well balanced diet and regular exercise encouraged. Consistent sleep schedule with 6-8 hours recommended.   Please continue follow up with care team as directed.   Follow up with Dr Jaynee Eagles 09/14/2021   You may receive a survey regarding today's visit. I encourage you to leave honest feed back as I do use this information to improve patient care. Thank you for seeing me today!     Management of Memory Problems   There are some general things you can do to help manage your memory problems.  Your memory may not in fact recover, but by using techniques and strategies you will be able to manage your memory difficulties better.   1)  Establish a routine. ? Try to establish and then stick to a regular routine.  By doing this, you will get used to what to expect and you will reduce the need to rely on your memory.  Also, try to do things at the same time of day, such as taking your medication or checking your calendar first thing in the morning. ? Think about think that you can do as a part of a regular routine and make a list.  Then enter them into a daily planner to remind you.  This will help you establish a routine.   2)  Organize your environment. ? Organize your environment so that it is uncluttered.  Decrease visual stimulation.  Place everyday items such as keys or cell phone in the same place every day (ie.  Basket next to front door) ? Use post it notes with a brief message to yourself (ie. Turn off light, lock the door) ? Use labels to indicate where things go (ie. Which cupboards are for food, dishes, etc.) ? Keep a notepad and pen by the telephone to take messages   3)  Memory Aids ? A diary or journal/notebook/daily planner ? Making a list (shopping  list, chore list, to do list that needs to be done) ? Using an alarm as a reminder (kitchen timer or cell phone alarm) ? Using cell phone to store information (Notes, Calendar, Reminders) ? Calendar/White board placed in a prominent position ? Post-it notes   In order for memory aids to be useful, you need to have good habits.  It's no good remembering to make a note in your journal if you don't remember to look in it.  Try setting aside a certain time of day to look in journal.   4)  Improving mood and managing fatigue. 1. There may be other factors that contribute to memory difficulties.  Factors, such as anxiety, depression and tiredness can affect memory.  Regular gentle exercise can help improve your mood and give you more energy.  Simple relaxation techniques may help relieve symptoms of anxiety  Try to get back to completing activities or hobbies you enjoyed doing in the past.  Learn to pace yourself through activities to decrease fatigue.  Find out about some local support groups where you can share experiences with others.  Try and achieve 7-8 hours of sleep at night.   Sleep Apnea Sleep apnea affects breathing during sleep. It causes breathing to stop for a short time or to become shallow. It can also increase the risk of:  Heart attack.  Stroke.  Being very overweight (obese).  Diabetes.  Heart failure.  Irregular heartbeat. The goal of treatment is to help you breathe normally again. What are the causes? There are three kinds of sleep apnea:  Obstructive sleep apnea. This is caused by a blocked or collapsed airway.  Central sleep apnea. This happens when the brain does not send the right signals to the muscles that control breathing.  Mixed sleep apnea. This is a combination of obstructive and central sleep apnea. The most common cause of this condition is a collapsed or blocked airway. This can happen if:  Your throat muscles are too relaxed.  Your  tongue and tonsils are too large.  You are overweight.  Your airway is too small.   What increases the risk?  Being overweight.  Smoking.  Having a small airway.  Being older.  Being male.  Drinking alcohol.  Taking medicines to calm yourself (sedatives or tranquilizers).  Having family members with the condition. What are the signs or symptoms?  Trouble staying asleep.  Being sleepy or tired during the day.  Getting angry a lot.  Loud snoring.  Headaches in the morning.  Not being able to focus your mind (concentrate).  Forgetting things.  Less interest in sex.  Mood swings.  Personality changes.  Feelings of sadness (depression).  Waking up a lot during the night to pee (urinate).  Dry mouth.  Sore throat. How is this diagnosed?  Your medical history.  A physical exam.  A test that is done when you are sleeping (sleep study). The test is most often done in a sleep lab but may also be done at home. How is this treated?  Sleeping on your side.  Using a medicine to get rid of mucus in your nose (decongestant).  Avoiding the use of alcohol, medicines to help you relax, or certain pain medicines (narcotics).  Losing weight, if needed.  Changing your diet.  Not smoking.  Using a machine to open your airway while you sleep, such as: ? An oral appliance. This is a mouthpiece that shifts your lower jaw forward. ? A CPAP device. This device blows air through a mask when you breathe out (exhale). ? An EPAP device. This has valves that you put in each nostril. ? A BPAP device. This device blows air through a mask when you breathe in (inhale) and breathe out.  Having surgery if other treatments do not work. It is important to get treatment for sleep apnea. Without treatment, it can lead to:  High blood pressure.  Coronary artery disease.  In men, not being able to have an erection (impotence).  Reduced thinking ability.   Follow these  instructions at home: Lifestyle  Make changes that your doctor recommends.  Eat a healthy diet.  Lose weight if needed.  Avoid alcohol, medicines to help you relax, and some pain medicines.  Do not use any products that contain nicotine or tobacco, such as cigarettes, e-cigarettes, and chewing tobacco. If you need help quitting, ask your doctor. General instructions  Take over-the-counter and prescription medicines only as told by your doctor.  If you were given a machine to use while you sleep, use it only as told by your doctor.  If you are having surgery, make sure to tell your doctor you have sleep apnea. You may need to bring your device with you.  Keep all follow-up visits as told by your doctor. This is important. Contact a doctor if:  The machine  that you were given to use during sleep bothers you or does not seem to be working.  You do not get better.  You get worse. Get help right away if:  Your chest hurts.  You have trouble breathing in enough air.  You have an uncomfortable feeling in your back, arms, or stomach.  You have trouble talking.  One side of your body feels weak.  A part of your face is hanging down. These symptoms may be an emergency. Do not wait to see if the symptoms will go away. Get medical help right away. Call your local emergency services (911 in the [place] Do not drive yourself to the hospital. Summary  This condition affects breathing during sleep.  The most common cause is a collapsed or blocked airway.  The goal of treatment is to help you breathe normally while you sleep. This information is not intended to replace advice given to you by your health care provider. Make sure you discuss any questions you have with your health care provider. Document Revised: 09/28/2018 Document Reviewed: 08/07/2018 Elsevier Patient Education  2021 [place].   Donepezil tablets What is this medicine? DONEPEZIL (doe NEP e zil) is used to  treat mild to moderate dementia caused by Alzheimer's disease. This medicine may be used for other purposes; ask your health care provider or pharmacist if you have questions. COMMON BRAND NAME(S): Aricept What should I tell my health care provider before I take this medicine? They need to know if you have any of these conditions:  asthma or other lung disease  difficulty passing urine  head injury  heart disease  history of irregular heartbeat  liver disease  seizures (convulsions)  stomach or intestinal disease, ulcers or stomach bleeding  an unusual or allergic reaction to donepezil, other medicines, foods, dyes, or preservatives  pregnant or trying to get pregnant  breast-feeding How should I use this medicine? Take this medicine by mouth with a glass of water. Follow the directions on the prescription label. You may take this medicine with or without food. Take this medicine at regular intervals. This medicine is usually taken before bedtime. Do not take it more often than directed. Continue to take your medicine even if you feel better. Do not stop taking except on your doctor's advice. If you are taking the 23 mg donepezil tablet, swallow it whole; do not cut, crush, or chew it. Talk to your pediatrician regarding the use of this medicine in children. Special care may be needed. Overdosage: If you think you have taken too much of this medicine contact a poison control center or emergency room at once. NOTE: This medicine is only for you. Do not share this medicine with others. What if I miss a dose? If you miss a dose, take it as soon as you can. If it is almost time for your next dose, take only that dose, do not take double or extra doses. What may interact with this medicine? Do not take this medicine with any of the following medications:  certain medicines for fungal infections like itraconazole, fluconazole, posaconazole, and  voriconazole  cisapride  dextromethorphan; quinidine  dronedarone  pimozide  quinidine  thioridazine This medicine may also interact with the following medications:  antihistamines for allergy, cough and cold  atropine  bethanechol  carbamazepine  certain medicines for bladder problems like oxybutynin, tolterodine  certain medicines for Parkinson's disease like benztropine, trihexyphenidyl  certain medicines for stomach problems like dicyclomine, hyoscyamine  certain medicines  for travel sickness like scopolamine  dexamethasone  dofetilide  ipratropium  NSAIDs, medicines for pain and inflammation, like ibuprofen or naproxen  other medicines for Alzheimer's disease  other medicines that prolong the QT interval (cause an abnormal heart rhythm)  phenobarbital  phenytoin  rifampin, rifabutin or rifapentine  ziprasidone This list may not describe all possible interactions. Give your health care provider a list of all the medicines, herbs, non-prescription drugs, or dietary supplements you use. Also tell them if you smoke, drink alcohol, or use illegal drugs. Some items may interact with your medicine. What should I watch for while using this medicine? Visit your doctor or health care professional for regular checks on your progress. Check with your doctor or health care professional if your symptoms do not get better or if they get worse. You may get drowsy or dizzy. Do not drive, use machinery, or do anything that needs mental alertness until you know how this drug affects you. What side effects may I notice from receiving this medicine? Side effects that you should report to your doctor or health care professional as soon as possible:  allergic reactions like skin rash, itching or hives, swelling of the face, lips, or tongue  feeling faint or lightheaded, falls  loss of bladder control  seizures  signs and symptoms of a dangerous change in heartbeat or  heart rhythm like chest pain; dizziness; fast or irregular heartbeat; palpitations; feeling faint or lightheaded, falls; breathing problems  signs and symptoms of infection like fever or chills; cough; sore throat; pain or trouble passing urine  signs and symptoms of liver injury like dark yellow or brown urine; general ill feeling or flu-like symptoms; light-colored stools; loss of appetite; nausea; right upper belly pain; unusually weak or tired; yellowing of the eyes or skin  slow heartbeat or palpitations  unusual bleeding or bruising  vomiting Side effects that usually do not require medical attention (report to your doctor or health care professional if they continue or are bothersome):  diarrhea, especially when starting treatment  headache  loss of appetite  muscle cramps  nausea  stomach upset This list may not describe all possible side effects. Call your doctor for medical advice about side effects. You may report side effects to FDA at 1-800-FDA-1088. Where should I keep my medicine? Keep out of reach of children. Store at room temperature between 15 and 30 degrees C (59 and 86 degrees F). Throw away any unused medicine after the expiration date. NOTE: This sheet is a summary. It may not cover all possible information. If you have questions about this medicine, talk to your doctor, pharmacist, or health care provider.  2021 Elsevier/Gold Standard (2018-12-03 10:33:41)

## 2021-06-01 NOTE — Progress Notes (Signed)
Chief Complaint  Patient presents with  . Follow-up    RM 1, alone. Here for memory loss. Pt states Aricept is causing him to have "weird dreams" and has woken up with small HA.      HISTORY OF PRESENT ILLNESS: 06/02/21 ALL:  [name] is a 65 y.o. male here today for follow up for memory loss. Dr Jaynee Eagles started him on Aricept following eval with Dr Sima Matas. Concerns for early onset AD raised with testing due to significant deficits in overall information processing speed, visual scanning and searching capacity. Significant and severe memory and learning deficits, both auditory> visual.   He reports that donepezil is causing him to have "weird dreams". He reports that he is thinking of sexual thoughts and having unusual dreams. He feels that he is having a "slight headache" in the mornings. He feels this is due to him "thinking all night long". He does feel that donepezil has helped with memory. He feels that his brain "is more ready to work". He feels he is holding on to information better than before. No other adverse effects. He is able to perform ADLs independently. He is helping with managing home. He is driving without difficulty.   He had a sleep study with Dr Brett Fairy 01/2021 in that showed mild/moderate OSA with AHI 14/hr with no significant hypoxemia. Cpap was recommended but patient declined. Referal to ENT for consideration of Inspire placed and he was evaluated but did not meet requirements. He reports that he has an old machine but unsure of age. He has a picture REMstarPlus M Series and serviced by [facility]. He was using a Mirage Quatro mask but unsure of size. He is unsure of pressure settings. He does not want a new machine. Review of chart shows appt with PCP on 06/2011 and orders placed for CPAP with set up for autotitrate but no specific settings mentioned.    HISTORY (copied from Dr Cathren Laine previous note)  HPI:  [name] is a 65 y.o. male here as  requested by Denita Lung, MD for memory difficulties.  Past medical history hypertension, obstructive sleep apnea, mild persistent asthma, brown tongue, hypogonadism, obesity status post surgery, diverticulosis, A. fib, former smoker. I reviewed Dr. Lanice Shirts notes: At prior appointment he complained of decreased memory, having more difficulty at work and also noted some of his colleagues have commented upon his lack of being as sharp as he has been in the past, he has a history of hypogonadism but did not start taking the AndroGel when it was called in last May, he has had some angioedema in from [facility] and switched to losartan, he does have a history of obstructive sleep apnea not on CPAP as he has done well by losing a lot of weight, he does have a history of LAP-BAND surgery, previous history of A. fib, sent for evaluation.  He is here with his wife. He travels a lot. It seems like more and more he can't remember their names or who they are for a few moments or until someone reminds him. He forgets where he puts things. His wife provides much information and she says it was several times they were driving and he panicked and thought he had to turn for 3-5 minutes he remembers these episodes and says he feels like he is lost. Worsening and more noticeable for a year. But ongoing for longer. He struggles with people's names but then it will pop up. He asks his  wife a lot where things are like she put it somewhere different. Patient pays the bills, he set them up automatically, he forgets to pay some bills but pays it when he gets the notice. He gets frustrated when his wife tries to help. He asks the same things over sometimes. He didn't know what day it was today. The things that bothers him the most is that he forgets people's names. Or he needs to reference someone and he doesn't know the name, if he dwells on it a while he can remember it. He is still working, Careers information officer and he is  having more difficulty. He feels he is making more errors. No history of significant alcohol or drug use. He is a former smoker but not heavy socially and that is 40 years ago. He lost a lot of weight and they did another sleep evaluation, he can fall asleep if he sits in a recliner. Been at least 10 years since last sleep test. He does not feel sad or feel depressed but he feels upset and mad that he can;t remember things. He doesn't know a lot about his family. No hallucinations or delusions. He does feel that as he gets older he says things he wouldn't in the past just because he doesn't care anymore what people think. When they go out to eat for example he gets more irritated and he has left the restaurant because of some perceived slight. He gets "short" with people more often.   Reviewed notes, labs and imaging from outside physicians, which showed: See above   REVIEW OF SYSTEMS: Out of a complete 14 system review of symptoms, the patient complains only of the following symptoms, memory loss, abnormal dreams, headaches and all other reviewed systems are negative.    ALLERGIES: Allergies  Allergen Reactions  . Ace Inhibitors Swelling  . Codeine Nausea And Vomiting     HOME MEDICATIONS: Outpatient Medications Prior to Visit  Medication Sig Dispense Refill  . acyclovir (ZOVIRAX) 200 MG capsule TAKE 2 CAPSULES BY MOUTH TWICE DAILY FOR PREVENTION 360 capsule 1  . albuterol (VENTOLIN HFA) 108 (90 Base) MCG/ACT inhaler INHALE 2 PUFFS INTO THE LUNGS EVERY 6 HOURS AS NEEDED FOR WHEEZING 36 g 1  . aspirin 81 MG tablet Take 81 mg by mouth daily.    . Cobalamin Combinations (NEURIVA PLUS PO) Take by mouth.    . Cyanocobalamin (VITAMIN B 12 PO) Take 2,000 mcg by mouth daily.    Marland Kitchen donepezil (ARICEPT) 5 MG tablet Take 1 tablet (5 mg total) by mouth at bedtime. 30 tablet 2  . fluticasone furoate-vilanterol (BREO ELLIPTA) 200-25 MCG/INH AEPB Inhale 1 puff into the lungs daily. 3 each 3  .  levocetirizine (XYZAL) 5 MG tablet Take 5 mg by mouth every evening.     Marland Kitchen losartan-hydrochlorothiazide (HYZAAR) 50-12.5 MG tablet Take 1 tablet by mouth daily. 90 tablet 1  . Multiple Vitamin (MULTIVITAMIN) tablet Take 1 tablet by mouth daily.     . Multiple Vitamins-Minerals (ICAPS AREDS 2 PO) Take by mouth.     . Testosterone (ANDROGEL PUMP) 20.25 MG/ACT (1.62%) GEL Place 2 Pump onto the skin daily. 75 g 5   No facility-administered medications prior to visit.     PAST MEDICAL HISTORY: Past Medical History:  Diagnosis Date  . Allergy    RHINITIS  . Asthma   . Atrial fibrillation ([place])   . Diverticular hemorrhage   . Dyslipidemia   . GERD (gastroesophageal reflux disease)   .  History of colonic polyps   . Hypertension   . Hypogonadism male   . Obesity   . Vitamin D deficiency      PAST SURGICAL HISTORY: Past Surgical History:  Procedure Laterality Date  . COLONOSCOPY    . ENDOVENOUS ABLATION SAPHENOUS VEIN W/ LASER  02/2011   right leg  . LAPAROSCOPIC GASTRIC BANDING  2008   [facility] MD  . partial thryoidectomy       FAMILY HISTORY: Family History  Problem Relation Age of Onset  . Arthritis Mother   . Hypertension Mother   . Ulcers Mother   . Breast cancer Mother   . Emphysema Father   . Asthma Maternal Aunt   . Heart disease Maternal Uncle   . Stroke Maternal Grandmother   . Heart disease Paternal Grandmother   . Colon cancer Neg Hx   . Stomach cancer Neg Hx      SOCIAL HISTORY: Social History   Socioeconomic History  . Marital status: Married    Spouse name: [name]  . Number of children: 1  . Years of education: Not on file  . Highest education level: Associate degree: occupational, Hotel manager, or vocational program  Occupational History    Comment: [name]  Tobacco Use  . Smoking status: Former Smoker    Types: Cigarettes    Quit date: 12/26/1997    Years since quitting: 23.4  . Smokeless tobacco: Never Used  Substance and Sexual  Activity  . Alcohol use: Yes    Alcohol/week: 1.0 standard drink    Types: 1 Glasses of wine per week    Comment: Monthly  . Drug use: No  . Sexual activity: Yes  Other Topics Concern  . Not on file  Social History Narrative   Lives with wife, 1 son and 1 daughter deceased   Sodas, 3 a day   Social Determinants of Health   Financial Resource Strain: Not on file  Food Insecurity: Not on file  Transportation Needs: Not on file  Physical Activity: Not on file  Stress: Not on file  Social Connections: Not on file  Intimate Partner Violence: Not on file      PHYSICAL EXAM  Vitals:   06/02/21 0919  BP: 136/77  Pulse: 90  Weight: 239 lb (108.4 kg)  Height: 6\' 1"  (1.854 m)   Body mass index is 31.53 kg/m.   Generalized: Well developed, in no acute distress  Cardiology: normal rate and rhythm, no murmur auscultated  Respiratory: clear to auscultation bilaterally    Neurological examination  Mentation: Alert oriented to time, place, history taking. Follows all commands speech and language fluent Cranial nerve II-XII: Pupils were equal round reactive to light. Extraocular movements were full, visual field were full on confrontational test. Facial sensation and strength were normal. Head turning and shoulder shrug  were normal and symmetric. Motor: The motor testing reveals 5 over 5 strength of all 4 extremities. Good symmetric motor tone is noted throughout.  Gait and station: Gait is normal.      DIAGNOSTIC DATA (LABS, IMAGING, TESTING) - I reviewed patient records, labs, notes, testing and imaging myself where available.  Lab Results  Component Value Date   WBC 6.6 05/18/2020   HGB 15.0 05/18/2020   HCT 45.6 05/18/2020   MCV 88 05/18/2020   PLT 263 05/18/2020      Component Value Date/Time   NA 139 11/25/2020 0829   K 4.2 11/25/2020 0829   CL 100 11/25/2020 0829  CO2 27 11/25/2020 0829   GLUCOSE 90 11/25/2020 0829   GLUCOSE 94 09/18/2017 0831   BUN 17  11/25/2020 0829   CREATININE 1.17 11/25/2020 0829   CREATININE 1.06 09/18/2017 0831   CALCIUM 9.2 11/25/2020 0829   PROT 7.8 05/18/2020 1214   ALBUMIN 4.4 05/18/2020 1214   AST 26 05/18/2020 1214   ALT 12 05/18/2020 1214   ALKPHOS 67 05/18/2020 1214   BILITOT 0.4 05/18/2020 1214   GFRNONAA 65 11/25/2020 0829   GFRAA 76 11/25/2020 0829   Lab Results  Component Value Date   CHOL 200 (H) 05/18/2020   HDL 53 05/18/2020   LDLCALC 133 (H) 05/18/2020   TRIG 79 05/18/2020   CHOLHDL 3.8 05/18/2020   Lab Results  Component Value Date   HGBA1C 5.6 05/31/2017   Lab Results  Component Value Date   VITAMINB12 379 11/25/2020   Lab Results  Component Value Date   TSH 2.830 09/16/2020    MMSE - Mini Mental State Exam 06/02/2021 11/25/2020 09/16/2020  Orientation to time 4 4 3   Orientation to Place 5 4 5   Registration 3 3 3   Attention/ Calculation 4 2 5   Recall 0 1 2  Language- name 2 objects 2 2 2   Language- repeat 1 1 1   Language- follow 3 step command 3 3 3   Language- read & follow direction 1 1 1   Write a sentence 1 1 1   Copy design 1 1 0  Total score 25 23 26      No flowsheet data found.   ASSESSMENT AND PLAN  65 y.o. year old male  has a past medical history of Allergy, Asthma, Atrial fibrillation ([place]), Diverticular hemorrhage, Dyslipidemia, GERD (gastroesophageal reflux disease), History of colonic polyps, Hypertension, Hypogonadism male, Obesity, and Vitamin D deficiency. 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 He feels that his brain is "working" better and that he is able to hold onto information a little easier than before. He has not completed full month of donepezil. He wishes to continue for now. MMSE 25/30,  previously 23/30.  He denies any other adverse effects. No shob or chest pain. We will continue donepezil 5mg  for now. May increase to 10mg  in the future if well tolerated. I will reach out to Dr Dohmeier regarding ability to reset current machine for auto titration based on last HST. I am concerned about the age of his CPAP machine, however, he does not feel he can afford a new machine. Not a candidate for Inspire and dental device is too expensive. We have reviewed memory compensation strategies and adverse effects of untreated OSA. He will focus on healthy lifestyle habits. He requests to keep scheduled appt with Dr Jaynee Eagles in 08/2021. He will call sooner if needed. He verbalizes understanding and agreement with this plan.    No orders of the defined types were placed in this encounter.    No orders of the defined types were placed in this encounter.    Debbora Presto, MSN, FNP-C 06/02/2021, 10:44 AM  [facility] [address] [place], [place] [address] [phone]

## 2021-06-02 ENCOUNTER — Encounter: Payer: Self-pay | Admitting: Family Medicine

## 2021-06-02 ENCOUNTER — Telehealth: Payer: Self-pay | Admitting: Family Medicine

## 2021-06-02 ENCOUNTER — Ambulatory Visit: Payer: Managed Care, Other (non HMO) | Admitting: Family Medicine

## 2021-06-02 VITALS — BP 136/77 | HR 90 | Ht 73.0 in | Wt 239.0 lb

## 2021-06-02 DIAGNOSIS — G300 Alzheimer's disease with early onset: Secondary | ICD-10-CM

## 2021-06-02 DIAGNOSIS — F518 Other sleep disorders not due to a substance or known physiological condition: Secondary | ICD-10-CM

## 2021-06-02 DIAGNOSIS — G4733 Obstructive sleep apnea (adult) (pediatric): Secondary | ICD-10-CM

## 2021-06-02 DIAGNOSIS — F0280 Dementia in other diseases classified elsewhere without behavioral disturbance: Secondary | ICD-10-CM

## 2021-06-02 DIAGNOSIS — G44219 Episodic tension-type headache, not intractable: Secondary | ICD-10-CM

## 2021-06-02 NOTE — Telephone Encounter (Signed)
Received message from [name].  I forwarded as a community message to [facility] to see if they could answer any of those questions.  Awaiting response.

## 2021-06-02 NOTE — Telephone Encounter (Signed)
Can you help me contact [facility] to inquire about the possibility of using Kenny's old machine with new auto titrating settings. Can they provide any info on current settings? He was started on CPAP in 2012 by PCP. REMstarPlus M Series and serviced by [facility]. He was using a Mirage Quatro mask but unsure of size. Review of chart shows appt with PCP on 06/2011 and orders placed for CPAP with set up for autotitrate but no specific settings mentioned. He can not afford a new machine. Not a candidate for Inspire and can not afford dental device. HST 01/2021 showed AHI 14/hr. Dr Dohmeier suggested AutoPap 6-16cmH20, EPR 3 and mask of choice. Heated humidity. No luna. TY!

## 2021-06-02 NOTE — Telephone Encounter (Signed)
Marchelle Gearing from [facility]. to Charles Melnick, RN I called the patient to see what kind of machine he is using. The order was sent over for APAP @ 6-16 so I am not sure that his machine would be capable of this.

## 2021-06-02 NOTE — Telephone Encounter (Signed)
Please keep me posted on response.

## 2021-06-03 NOTE — Telephone Encounter (Signed)
RE: using old cpap with new settings? Received: Doran Clay, Donne Anon, RN He is using a very old machine and it will only do the set pressure. He has a $600 deductible and it has been met. So he will only be responsible for 20% which is 1st month $79.08  then $13.67 until his deductible resets then it will be $68.33. It only rents for a total of 10 months. I explained all of this to him and told him I would call him back when I find out about his machine.

## 2021-06-04 ENCOUNTER — Telehealth: Payer: Self-pay | Admitting: Neurology

## 2021-06-04 NOTE — Telephone Encounter (Signed)
As I understand, this patient's CPAP is 65 years old, preceding his weight loss surgery?  WE don't know his current setting on this machine, if any DME will assist in setting it for 7 cm water, we may not want to push too much pressure.  He will need new supplies, mask , tubing, filter.  If he can just stay of his back, he could avoid any sleep apnea treatment.   Dawna Part is a possibility for patient with BMI of 32 or less ( his BMI fits) and for patient's without central or REM dependent apnea and without hypoxemia, ( no COPD, uncontrolled pulmonary HTN or fibrosis) usually recommended for AHI of 15  and up. It may reduce AHI by 68% .   So, if sleeping on his side  or prone can't be done- this is an option. CD

## 2021-06-07 NOTE — Telephone Encounter (Signed)
Noted  

## 2021-06-08 ENCOUNTER — Telehealth: Payer: Self-pay | Admitting: Family Medicine

## 2021-06-08 NOTE — Telephone Encounter (Signed)
error 

## 2021-06-10 ENCOUNTER — Telehealth: Payer: Self-pay

## 2021-06-10 NOTE — Telephone Encounter (Signed)
Patient is coming to see me on Tuesday, June 21st at [facility] to pick up a refurbished autoCPAP machine. The machine has been cleaned and checked out. I have set the machine to auto 6-16cm with EPR at 3. I will also fit patient for a new mask. He will be set-up with a machine, tubing, filter, sd card and mask at no charge.

## 2021-06-10 NOTE — Telephone Encounter (Signed)
Dear Carron Brazen, Thank you, this is an extraordinary service for this patient.  Much appreciated!

## 2021-06-24 NOTE — Telephone Encounter (Signed)
Pt was setup on 06/15/2021 with a free ResMed CPAP machine. Pt was provide with tubing, filters, and a ResMed P20 nasal pillow mask size Medium. Pt was educated on how to use machine and mask.  Also shown how to properly clean mask, tubing, and humidifier. Pt understood use of machine and mask.

## 2021-07-09 ENCOUNTER — Other Ambulatory Visit: Payer: Self-pay | Admitting: Neurology

## 2021-07-09 DIAGNOSIS — R413 Other amnesia: Secondary | ICD-10-CM

## 2021-09-03 ENCOUNTER — Encounter: Payer: Self-pay | Admitting: Internal Medicine

## 2021-09-14 ENCOUNTER — Encounter: Payer: Self-pay | Admitting: Neurology

## 2021-09-14 ENCOUNTER — Other Ambulatory Visit: Payer: Self-pay | Admitting: Family Medicine

## 2021-09-14 ENCOUNTER — Ambulatory Visit: Payer: Managed Care, Other (non HMO) | Admitting: Neurology

## 2021-09-14 ENCOUNTER — Telehealth: Payer: Self-pay | Admitting: Neurology

## 2021-09-14 VITALS — BP 112/71 | HR 54 | Ht 73.0 in | Wt 236.0 lb

## 2021-09-14 DIAGNOSIS — R413 Other amnesia: Secondary | ICD-10-CM | POA: Diagnosis not present

## 2021-09-14 DIAGNOSIS — E538 Deficiency of other specified B group vitamins: Secondary | ICD-10-CM

## 2021-09-14 MED ORDER — DONEPEZIL HCL 10 MG PO TABS
10.0000 mg | ORAL_TABLET | ORAL | 6 refills | Status: DC
Start: 2021-09-14 — End: 2022-08-08

## 2021-09-14 NOTE — Progress Notes (Addendum)
[facility]    Provider:  Dr Jaynee Eagles Requesting Provider: Denita Lung, MD Primary Care Provider:  Denita Lung, MD  CC:  Memory loss  09/14/2021; Patient here for follow up on Alzheimer's dementia formally diagnosed by neurocognitive testing Dr. Sima Matas. Follow up testing requeste in 9 months from 05/12/2019 date of diagnosis with Dr. Sima Matas. He got a new cpap machine but having difficu,ty wearing the mask, think it is really important to get his sleep apnea controlled before restesting with dr. Sima Matas.   Patient saw amy Lomax on 06/02/2021, she discussed increase to Aricept10mg  in the future if well tolerated. I will reach out to Dr Dohmeier regarding ability to reset current machine for auto titration based on last HST. Amy was concerned about the age of his CPAP machine, however, he does not feel he can afford a new machine. Not a candidate for Inspire and dental device is too expensive. We have reviewed memory compensation strategies and adverse effects of untreated OSA again today with patient  HPI 11/2020:  [name] is a 65 y.o. male here as requested by Denita Lung, MD for memory difficulties.  Past medical history hypertension, obstructive sleep apnea, mild persistent asthma, brown tongue, hypogonadism, obesity status post surgery, diverticulosis, A. fib, former smoker. I reviewed Dr. Lanice Shirts notes: At prior appointment he complained of decreased memory, having more difficulty at work and also noted some of his colleagues have commented upon his lack of being as sharp as he has been in the past, he has a history of hypogonadism but did not start taking the AndroGel when it was called in last May, he has had some angioedema in from ACE and switched to losartan, he does have a history of obstructive sleep apnea not on CPAP as he has done well by losing a lot of weight, he does have a history of LAP-BAND surgery, previous history of A. fib, sent for  evaluation.  He is here with his wife. He travels a lot. It seems like more and more he can't remember their names or who they are for a few moments or until someone reminds him. He forgets where he puts things. His wife provides much information and she says it was several times they were driving and he panicked and thought he had to turn for 3-5 minutes he remembers these episodes and says he feels like he is lost. Worsening and more noticeable for a year. But ongoing for longer. He struggles with people's names but then it will pop up. He asks his wife a lot where things are like she put it somewhere different. Patient pays the bills, he set them up automatically, he forgets to pay some bills but pays it when he gets the notice. He gets frustrated when his wife tries to help. He asks the same things over sometimes. He didn't know what day it was today. The things that bothers him the most is that he forgets people's names. Or he needs to reference someone and he doesn't know the name, if he dwells on it a while he can remember it. 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 He feels he is making more errors. No history of significant alcohol or drug use. He is a former smoker but not heavy socially and that is 40 years ago. He lost a lot of weight and they did another sleep evaluation, he can fall asleep if he sits in a recliner. Been  at least 10 years since last sleep test. He does not feel sad or feel depressed but he feels upset and mad that he can;t remember things. He doesn't know a lot about his family. No hallucinations or delusions. He does feel that as he gets older he says things he wouldn't in the past just because he doesn't care anymore what people think. When they go out to eat for example he gets more irritated and he has left the restaurant because of some perceived slight. He gets "short" with people more often.   Reviewed notes, labs and imaging from  outside physicians, which showed: See above   Review of Systems: Patient complains of symptoms per HPI as well as the following symptoms: memory worsening . Pertinent negatives and positives per HPI. All others negative    Social History   Socioeconomic History   Marital status: Married    Spouse name: [name]   Number of children: 1   Years of education: Not on file   Highest education level: Associate degree: occupational, Hotel manager, or vocational program  Occupational History    Comment: [name]  Tobacco Use   Smoking status: Former    Types: Cigarettes    Quit date: 12/26/1997    Years since quitting: 23.7   Smokeless tobacco: Never  Vaping Use   Vaping Use: Never used  Substance and Sexual Activity   Alcohol use: Yes    Alcohol/week: 1.0 standard drink    Types: 1 Glasses of wine per week    Comment: Monthly   Drug use: No   Sexual activity: Yes  Other Topics Concern   Not on file  Social History Narrative   Lives with wife, 1 son and 1 daughter deceased   Sodas, 3 a day   Social Determinants of Health   Financial Resource Strain: Not on file  Food Insecurity: Not on file  Transportation Needs: Not on file  Physical Activity: Not on file  Stress: Not on file  Social Connections: Not on file  Intimate Partner Violence: Not on file    Family History  Problem Relation Age of Onset   Arthritis Mother    Hypertension Mother    Ulcers Mother    Breast cancer Mother    Emphysema Father    Asthma Maternal Aunt    Heart disease Maternal Uncle    Stroke Maternal Grandmother    Heart disease Paternal Grandmother    Colon cancer Neg Hx    Stomach cancer Neg Hx     Past Medical History:  Diagnosis Date   Allergy    RHINITIS   Asthma    Atrial fibrillation ([place])    Diverticular hemorrhage    Dyslipidemia    GERD (gastroesophageal reflux disease)    History of colonic polyps    Hypertension    Hypogonadism male    Obesity    Vitamin D  deficiency     Patient Active Problem List   Diagnosis Date Noted   Bariatric surgery status 01/14/2021   Chronic intermittent hypoxia with obstructive sleep apnea 01/14/2021   Memory difficulties 01/14/2021   Mild persistent asthma without complication 69/67/8938   Erectile dysfunction 12/12/2017   History of adenomatous polyp of colon 09/18/2017   Former smoker, stopped smoking in distant past 09/18/2017   [place] tongue 05/31/2017   History of diverticulosis 04/04/2016   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 12/25/2013   Hypogonadism male 03/26/2012   Obesity 10/08/2007   Obstructive sleep apnea 10/08/2007  Essential hypertension 10/08/2007   Allergic rhinitis 10/08/2007   History of atrial fibrillation 10/08/2007    Past Surgical History:  Procedure Laterality Date   COLONOSCOPY     ENDOVENOUS ABLATION SAPHENOUS VEIN W/ LASER  02/2011   right leg   LAPAROSCOPIC GASTRIC BANDING  2008   EARLE MD   partial thryoidectomy      Current Outpatient Medications  Medication Sig Dispense Refill   acyclovir (ZOVIRAX) 200 MG capsule TAKE 2 CAPSULES BY MOUTH TWICE DAILY FOR PREVENTION 360 capsule 1   albuterol (VENTOLIN HFA) 108 (90 Base) MCG/ACT inhaler INHALE 2 PUFFS INTO THE LUNGS EVERY 6 HOURS AS NEEDED FOR WHEEZING 36 g 1   aspirin 81 MG tablet Take 81 mg by mouth daily.     Cobalamin Combinations (NEURIVA PLUS PO) Take by mouth.     Cyanocobalamin (VITAMIN B 12 PO) Take 2,000 mcg by mouth daily.     fluticasone furoate-vilanterol (BREO ELLIPTA) 200-25 MCG/INH AEPB Inhale 1 puff into the lungs daily. 3 each 3   levocetirizine (XYZAL) 5 MG tablet Take 5 mg by mouth every evening.      losartan-hydrochlorothiazide (HYZAAR) 50-12.5 MG tablet TAKE 1 TABLET DAILY 90 tablet 0   Multiple Vitamin (MULTIVITAMIN) tablet Take 1 tablet by mouth daily.      Multiple Vitamins-Minerals (ICAPS AREDS 2 PO) Take by mouth.      Testosterone (ANDROGEL PUMP) 20.25 MG/ACT (1.62%) GEL Place 2 Pump onto the skin  daily. 75 g 5   donepezil (ARICEPT) 10 MG tablet Take 1 tablet (10 mg total) by mouth every morning. 90 tablet 6   No current facility-administered medications for this visit.    Allergies as of 09/14/2021 - Review Complete 09/14/2021  Allergen Reaction Noted   Ace inhibitors Swelling 09/16/2020   Codeine Nausea And Vomiting     Vitals: BP 112/71   Pulse (!) 54   Ht 6\' 1"  (1.854 m)   Wt 236 lb (107 kg)   BMI 31.14 kg/m  Last Weight:  Wt Readings from Last 1 Encounters:  09/14/21 236 lb (107 kg)   Last Height:   Ht Readings from Last 1 Encounters:  09/14/21 6\' 1"  (1.854 m)   Exam: NAD, pleasant                  Speech:    Speech is normal; fluent and spontaneous with normal comprehension.  Cognition:    MMSE - Mini Mental State Exam 09/14/2021 06/02/2021 11/25/2020  Orientation to time 4 4 4   Orientation to Place 4 5 4   Registration 3 3 3   Attention/ Calculation 5 4 2   Recall 3 0 1  Language- name 2 objects 2 2 2   Language- repeat 1 1 1   Language- follow 3 step command 3 3 3   Language- read & follow direction 1 1 1   Write a sentence 1 1 1   Copy design 1 1 1   Total score 28 25 23       Cranial Nerves:    The pupils are equal, round, and reactive to light.Trigeminal sensation is intact and the muscles of mastication are normal. The face is symmetric. The palate elevates in the midline. Hearing intact. Voice is normal. Shoulder shrug is normal. The tongue has normal motion without fasciculations.   Coordination:  No dysmetria  Motor Observation:    No asymmetry, no atrophy, and no involuntary movements noted. Tone:    Normal muscle tone.     Strength:    Strength  is V/V in the upper and lower limbs.      Sensation: intact to LT     Assessment/Plan:  65 y.o. male here as requested by Denita Lung, MD for memory difficulties.  Past medical history hypertension, obstructive sleep apnea, mild persistent asthma, brown tongue, hypogonadism, obesity status post  surgery, diverticulosis, A. fib, former smoker.  Addendum 05/13/2021: He had his formal memory testing with Dr. Sima Matas. Started him on Aricept 5mg  because his pulse was 62 and wanted to make sure he didn't have bradycardia due to aricept. He is having a problem using his cpap and that is one of the things that Dr. Sima Matas recommended. By report, he was not appropriate for inspire and dental device not covered by insurance so I would like help with his cpap (he saw Dr. Brett Fairy for his sleep study). He is willing to follow up with Amy Lomax or Ward Givens.   - AHI 14, we gave him a new free cpap, today gave him a new mask today to see if we can get him to use his cpap, we fitted him today and encouraged him  Increased donepezil for memory - 10mg , watch for bradycardia  New mask - NEED to treat sleep apnea prior to testing again with Dr. Sima Matas - sleep apnea can be causing the memory loss, needs to be treated  - MRI of the brain w/wo contrast to look for reversible causes of dementia:personally reviewed imaging: This MRI of the brain with and without contrast shows the following: 1.   Mild generalized cortical atrophy. 2.   Scattered T2/FLAIR hyperintense foci consistent with mild chronic microvascular ischemic change. 3.   No acute findings.  Normal enhancement pattern.   - Blood work: retest B12  - Formal memory testing: repeat pending  - Has OSA - see above   Orders Placed This Encounter  Procedures   B12 and Folate Panel   Methylmalonic acid, serum    Cc: Denita Lung, MD,  Denita Lung, MD  Sarina Ill, MD  [facility] [address] [place], [place] [address]  Phone [phone] Fax [phone]  I spent 40 minutes of face-to-face and non-face-to-face time with patient on the  1. Memory loss   2. B12 deficiency    diagnosis.  This included previsit chart review, lab review, study review, order entry, electronic health  record documentation, patient education on the different diagnostic and therapeutic options, counseling and coordination of care, risks and benefits of management, compliance, or risk factor reduction

## 2021-09-14 NOTE — Patient Instructions (Signed)
Increased donepezil for memory New mask - NEED to treat sleep apnea prior to testing again with Dr. Sima Matas - sleep apnea can be causing the memory loss, needs to be treated  Memory Compensation Strategies  Use "WARM" strategy.  W= write it down  A= associate it  R= repeat it  M= make a mental note  2.   You can keep a Social worker.  Use a 3-ring notebook with sections for the following: calendar, important names and phone numbers,  medications, doctors' names/phone numbers, lists/reminders, and a section to journal what you did  each day.   3.    Use a calendar to write appointments down.  4.    Write yourself a schedule for the day.  This can be placed on the calendar or in a separate section of the Memory Notebook.  Keeping a  regular schedule can help memory.  5.    Use medication organizer with sections for each day or morning/evening pills.  You may need help loading it  6.    Keep a basket, or pegboard by the door.  Place items that you need to take out with you in the basket or on the pegboard.  You may also want to  include a message board for reminders.  7.    Use sticky notes.  Place sticky notes with reminders in a place where the task is performed.  For example: " turn off the  stove" placed by the stove, "lock the door" placed on the door at eye level, " take your medications" on  the bathroom mirror or by the place where you normally take your medications.  8.    Use alarms/timers.  Use while cooking to remind yourself to check on food or as a reminder to take your medicine, or as a  reminder to make a call, or as a reminder to perform another task, etc.  Donepezil tablets What is this medication? DONEPEZIL (doe NEP e zil) is used to treat mild to moderate dementia caused by Alzheimer's disease. This medicine may be used for other purposes; ask your health care provider or pharmacist if you have questions. COMMON BRAND NAME(S): Aricept What should I tell my  care team before I take this medication? They need to know if you have any of these conditions: asthma or other lung disease difficulty passing urine head injury heart disease history of irregular heartbeat liver disease seizures (convulsions) stomach or intestinal disease, ulcers or stomach bleeding an unusual or allergic reaction to donepezil, other medicines, foods, dyes, or preservatives pregnant or trying to get pregnant breast-feeding How should I use this medication? Take this medicine by mouth with a glass of water. Follow the directions on the prescription label. You may take this medicine with or without food. Take this medicine at regular intervals. This medicine is usually taken before bedtime. Do not take it more often than directed. Continue to take your medicine even if you feel better. Do not stop taking except on your doctor's advice. If you are taking the 23 mg donepezil tablet, swallow it whole; do not cut, crush, or chew it. Talk to your pediatrician regarding the use of this medicine in children. Special care may be needed. Overdosage: If you think you have taken too much of this medicine contact a poison control center or emergency room at once. NOTE: This medicine is only for you. Do not share this medicine with others. What if I miss a dose? If you miss  a dose, take it as soon as you can. If it is almost time for your next dose, take only that dose, do not take double or extra doses. What may interact with this medication? Do not take this medicine with any of the following medications: certain medicines for fungal infections like itraconazole, fluconazole, posaconazole, and voriconazole cisapride dextromethorphan; quinidine dronedarone pimozide quinidine thioridazine This medicine may also interact with the following medications: antihistamines for allergy, cough and cold atropine bethanechol carbamazepine certain medicines for bladder problems like  oxybutynin, tolterodine certain medicines for Parkinson's disease like benztropine, trihexyphenidyl certain medicines for stomach problems like dicyclomine, hyoscyamine certain medicines for travel sickness like scopolamine dexamethasone dofetilide ipratropium NSAIDs, medicines for pain and inflammation, like ibuprofen or naproxen other medicines for Alzheimer's disease other medicines that prolong the QT interval (cause an abnormal heart rhythm) phenobarbital phenytoin rifampin, rifabutin or rifapentine ziprasidone This list may not describe all possible interactions. Give your health care provider a list of all the medicines, herbs, non-prescription drugs, or dietary supplements you use. Also tell them if you smoke, drink alcohol, or use illegal drugs. Some items may interact with your medicine. What should I watch for while using this medication? Visit your doctor or health care professional for regular checks on your progress. Check with your doctor or health care professional if your symptoms do not get better or if they get worse. You may get drowsy or dizzy. Do not drive, use machinery, or do anything that needs mental alertness until you know how this drug affects you. What side effects may I notice from receiving this medication? Side effects that you should report to your doctor or health care professional as soon as possible: allergic reactions like skin rash, itching or hives, swelling of the face, lips, or tongue feeling faint or lightheaded, falls loss of bladder control seizures signs and symptoms of a dangerous change in heartbeat or heart rhythm like chest pain; dizziness; fast or irregular heartbeat; palpitations; feeling faint or lightheaded, falls; breathing problems signs and symptoms of infection like fever or chills; cough; sore throat; pain or trouble passing urine signs and symptoms of liver injury like dark yellow or brown urine; general ill feeling or flu-like  symptoms; light-colored stools; loss of appetite; nausea; right upper belly pain; unusually weak or tired; yellowing of the eyes or skin slow heartbeat or palpitations unusual bleeding or bruising vomiting Side effects that usually do not require medical attention (report to your doctor or health care professional if they continue or are bothersome): diarrhea, especially when starting treatment headache loss of appetite muscle cramps nausea stomach upset This list may not describe all possible side effects. Call your doctor for medical advice about side effects. You may report side effects to FDA at 1-800-FDA-1088. Where should I keep my medication? Keep out of reach of children. Store at room temperature between 15 and 30 degrees C (59 and 86 degrees F). Throw away any unused medicine after the expiration date. NOTE: This sheet is a summary. It may not cover all possible information. If you have questions about this medicine, talk to your doctor, pharmacist, or health care provider.  2022 Elsevier/Gold Standard (2018-12-03 10:33:41)

## 2021-09-14 NOTE — Telephone Encounter (Signed)
Check out note:  Return in about 4 weeks (around 10/12/2021).  Check out comments: 4-6 weeks with amy if she doesn't have anything please let me know and I will get her to set it up  Amy doesn't have anything until January, I let patient know we will work him in and give him a call. He said okay, he will be out of town the last week of October.

## 2021-09-18 LAB — B12 AND FOLATE PANEL
Folate: 17.1 ng/mL (ref >3.0)
Vitamin B-12: >2000 pg/mL — ABNORMAL HIGH (ref 232–1245)

## 2021-09-18 LAB — METHYLMALONIC ACID, SERUM: Methylmalonic Acid: 151 nmol/L (ref 0–378)

## 2021-09-22 ENCOUNTER — Encounter: Payer: Managed Care, Other (non HMO) | Admitting: Family Medicine

## 2021-10-08 ENCOUNTER — Encounter: Payer: Self-pay | Admitting: Family Medicine

## 2021-10-08 ENCOUNTER — Ambulatory Visit: Payer: Managed Care, Other (non HMO) | Admitting: Family Medicine

## 2021-10-08 ENCOUNTER — Other Ambulatory Visit: Payer: Self-pay

## 2021-10-08 VITALS — BP 128/82 | HR 69 | Temp 97.8°F | Ht 73.0 in | Wt 234.4 lb

## 2021-10-08 DIAGNOSIS — Z23 Encounter for immunization: Secondary | ICD-10-CM | POA: Diagnosis not present

## 2021-10-08 DIAGNOSIS — Z0000 Encounter for general adult medical examination without abnormal findings: Secondary | ICD-10-CM | POA: Diagnosis not present

## 2021-10-08 DIAGNOSIS — G4734 Idiopathic sleep related nonobstructive alveolar hypoventilation: Secondary | ICD-10-CM

## 2021-10-08 DIAGNOSIS — I10 Essential (primary) hypertension: Secondary | ICD-10-CM | POA: Diagnosis not present

## 2021-10-08 DIAGNOSIS — J4530 Mild persistent asthma, uncomplicated: Secondary | ICD-10-CM

## 2021-10-08 DIAGNOSIS — E6609 Other obesity due to excess calories: Secondary | ICD-10-CM | POA: Diagnosis not present

## 2021-10-08 DIAGNOSIS — G4733 Obstructive sleep apnea (adult) (pediatric): Secondary | ICD-10-CM

## 2021-10-08 DIAGNOSIS — N529 Male erectile dysfunction, unspecified: Secondary | ICD-10-CM

## 2021-10-08 DIAGNOSIS — R413 Other amnesia: Secondary | ICD-10-CM

## 2021-10-08 DIAGNOSIS — Z9884 Bariatric surgery status: Secondary | ICD-10-CM

## 2021-10-08 DIAGNOSIS — J309 Allergic rhinitis, unspecified: Secondary | ICD-10-CM | POA: Diagnosis not present

## 2021-10-08 DIAGNOSIS — Z86010 Personal history of colonic polyps: Secondary | ICD-10-CM

## 2021-10-08 DIAGNOSIS — E291 Testicular hypofunction: Secondary | ICD-10-CM

## 2021-10-08 DIAGNOSIS — Z6831 Body mass index (BMI) 31.0-31.9, adult: Secondary | ICD-10-CM

## 2021-10-08 DIAGNOSIS — Z8679 Personal history of other diseases of the circulatory system: Secondary | ICD-10-CM

## 2021-10-08 DIAGNOSIS — Z8619 Personal history of other infectious and parasitic diseases: Secondary | ICD-10-CM

## 2021-10-08 DIAGNOSIS — Z125 Encounter for screening for malignant neoplasm of prostate: Secondary | ICD-10-CM

## 2021-10-08 DIAGNOSIS — Z136 Encounter for screening for cardiovascular disorders: Secondary | ICD-10-CM

## 2021-10-08 DIAGNOSIS — Z87891 Personal history of nicotine dependence: Secondary | ICD-10-CM

## 2021-10-08 LAB — POCT URINALYSIS DIP (PROADVANTAGE DEVICE)
Bilirubin, UA: NEGATIVE
Blood, UA: NEGATIVE
Glucose, UA: NEGATIVE mg/dL
Ketones, POC UA: NEGATIVE mg/dL
Leukocytes, UA: NEGATIVE
Nitrite, UA: NEGATIVE
Specific Gravity, Urine: 1.025
Urobilinogen, Ur: 0.2
pH, UA: 5.5 (ref 5.0–8.0)

## 2021-10-08 MED ORDER — ATORVASTATIN CALCIUM 20 MG PO TABS: 20.0000 mg | ORAL_TABLET | Freq: Every day | ORAL | 4 refills | Status: DC

## 2021-10-08 MED ORDER — ACYCLOVIR 200 MG PO CAPS: ORAL_CAPSULE | ORAL | 1 refills | Status: DC

## 2021-10-08 MED ORDER — LEVOCETIRIZINE DIHYDROCHLORIDE 5 MG PO TABS: 5.0000 mg | ORAL_TABLET | Freq: Every evening | ORAL | 3 refills | Status: DC

## 2021-10-08 MED ORDER — FLUTICASONE FUROATE-VILANTEROL 200-25 MCG/INH IN AEPB: 1.0000 | INHALATION_SPRAY | Freq: Every day | RESPIRATORY_TRACT | 3 refills | Status: DC

## 2021-10-08 MED ORDER — LOSARTAN POTASSIUM-HCTZ 50-12.5 MG PO TABS: 1.0000 | ORAL_TABLET | Freq: Every day | ORAL | 3 refills | Status: DC

## 2021-10-08 NOTE — Progress Notes (Signed)
   Subjective:    Patient ID: [name], male    DOB: March 10, 1956, 65 y.o.   MRN: [id number]  HPI He is here for complete examination.  He has had difficulty recently with memory issues and is being seen by neurology.  Their concern was OSA contributing to that.  He is not have good luck with using the CPAP machine.  He was seen by ENT but does not qualify due to his AHI of being under 15.  His weight is borderline.  Neurology has him on Aricept at the present time.  He is exploring dental appliance options for the OSA.  His asthma seems to be under control with his present medications.  He uses the rescue inhaler usually once or twice per week.  His allergies are under good control.  He does use Zovirax on an as-needed basis for herpes.  He would like further evaluation of his testosterone as the last time it was checked it was below 300.  He says that he is having some energy and stamina issues.  Also has been seen by a retina specialist however I am unsure of exactly the etiology of that.  Does have a previous history of bariatric surgery and did get follow-up concerning that earlier this year.  He is also scheduled for follow-up on his colonoscopy next year.  He does have a remote history of atrial for but no symptoms recently.  Work and marriage as well as home life are going well.  Otherwise family and social history as well as health maintenance and immunizations was reviewed   Review of Systems  All other systems reviewed and are negative.     Objective:   Physical Exam Alert and in no distress. Tympanic membranes and canals are normal. Pharyngeal area is normal. Neck is supple without adenopathy or thyromegaly. Cardiac exam shows a regular sinus rhythm without murmurs or gallops. Lungs are clear to auscultation. Abdominal exam shows no masses or tenderness except for the 3 cm round smooth appliance for the gastric sleeve.       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  Discussed using the CPAP machine more regularly to see if he can get benefit from this.  He will also check with various dentist to see the cost of a dental appliance.  Continue on his allergy and asthma medications.  We will attempt to get the information from the retina specialist.  I will also place him on Lipitor based on his age

## 2021-10-09 LAB — CBC WITH DIFFERENTIAL/PLATELET
Basophils Absolute: 0.1 10*3/uL (ref 0.0–0.2)
Basos: 1 %
EOS (ABSOLUTE): 0.3 10*3/uL (ref 0.0–0.4)
Eos: 5 %
Hematocrit: 41 % (ref 37.5–51.0)
Hemoglobin: 13.2 g/dL (ref 13.0–17.7)
Immature Grans (Abs): 0 10*3/uL (ref 0.0–0.1)
Immature Granulocytes: 0 %
Lymphocytes Absolute: 2.6 10*3/uL (ref 0.7–3.1)
Lymphs: 37 %
MCH: 26.6 pg (ref 26.6–33.0)
MCHC: 32.2 g/dL (ref 31.5–35.7)
MCV: 83 fL (ref 79–97)
Monocytes Absolute: 0.6 10*3/uL (ref 0.1–0.9)
Monocytes: 8 %
Neutrophils Absolute: 3.4 10*3/uL (ref 1.4–7.0)
Neutrophils: 49 %
Platelets: 271 10*3/uL (ref 150–450)
RBC: 4.97 x10E6/uL (ref 4.14–5.80)
RDW: 13.7 % (ref 11.6–15.4)
WBC: 7 10*3/uL (ref 3.4–10.8)

## 2021-10-09 LAB — COMPREHENSIVE METABOLIC PANEL
ALT: 22 IU/L (ref 0–44)
AST: 31 IU/L (ref 0–40)
Albumin/Globulin Ratio: 1.3 (ref 1.2–2.2)
Albumin: 4.3 g/dL (ref 3.8–4.8)
Alkaline Phosphatase: 81 IU/L (ref 44–121)
BUN/Creatinine Ratio: 10 (ref 10–24)
BUN: 13 mg/dL (ref 8–27)
Bilirubin Total: 0.2 mg/dL (ref 0.0–1.2)
CO2: 26 mmol/L (ref 20–29)
Calcium: 9.2 mg/dL (ref 8.6–10.2)
Chloride: 101 mmol/L (ref 96–106)
Creatinine, Ser: 1.31 mg/dL — ABNORMAL HIGH (ref 0.76–1.27)
Globulin, Total: 3.3 g/dL (ref 1.5–4.5)
Glucose: 98 mg/dL (ref 70–99)
Potassium: 4.3 mmol/L (ref 3.5–5.2)
Sodium: 142 mmol/L (ref 134–144)
Total Protein: 7.6 g/dL (ref 6.0–8.5)
eGFR: 60 mL/min/{1.73_m2} (ref >59)

## 2021-10-09 LAB — LIPID PANEL
Chol/HDL Ratio: 3.7 ratio (ref 0.0–5.0)
Cholesterol, Total: 181 mg/dL (ref 100–199)
HDL: 49 mg/dL (ref >39)
LDL Chol Calc (NIH): 115 mg/dL — ABNORMAL HIGH (ref 0–99)
Triglycerides: 93 mg/dL (ref 0–149)
VLDL Cholesterol Cal: 17 mg/dL (ref 5–40)

## 2021-10-12 ENCOUNTER — Encounter: Payer: Self-pay | Admitting: Family Medicine

## 2021-10-13 ENCOUNTER — Ambulatory Visit
Admission: RE | Admit: 2021-10-13 | Discharge: 2021-10-13 | Disposition: A | Discharge status: Home / Self Care | Payer: Managed Care, Other (non HMO) | Source: Ambulatory Visit | Attending: Family Medicine | Admitting: Family Medicine

## 2021-10-13 ENCOUNTER — Other Ambulatory Visit: Payer: Self-pay | Admitting: Family Medicine

## 2021-10-13 ENCOUNTER — Other Ambulatory Visit: Payer: Self-pay

## 2021-10-13 DIAGNOSIS — Z136 Encounter for screening for cardiovascular disorders: Secondary | ICD-10-CM

## 2021-10-19 ENCOUNTER — Encounter: Payer: Self-pay | Admitting: Family Medicine

## 2021-11-03 ENCOUNTER — Encounter: Payer: Self-pay | Admitting: Family Medicine

## 2021-11-03 ENCOUNTER — Other Ambulatory Visit: Payer: Self-pay

## 2021-11-03 ENCOUNTER — Telehealth (INDEPENDENT_AMBULATORY_CARE_PROVIDER_SITE_OTHER): Payer: Managed Care, Other (non HMO) | Admitting: Family Medicine

## 2021-11-03 VITALS — Temp 98.7°F | Ht 73.0 in | Wt 230.0 lb

## 2021-11-03 DIAGNOSIS — J302 Other seasonal allergic rhinitis: Secondary | ICD-10-CM | POA: Diagnosis not present

## 2021-11-03 DIAGNOSIS — I10 Essential (primary) hypertension: Secondary | ICD-10-CM

## 2021-11-03 NOTE — Patient Instructions (Signed)
Stay well hydrated Continue to use your nasal spray every day (it can take up to a week or longer to see the full effect in treating your allergies). Take the xyzal every day. Do not take the nighttime over-the-counter medication (Nyquil, or whatever you have).  Guaifenesin is an expectorant that is helpful in keeping the mucus or phlegm thin.  This helps with cough and chest congestion. You have Mucinex DM at home.  This contains the guaifenesin, as well as the cough suppressant dextromethorphan.  Take that twice daily. STOP DAYQUIL and Nyquil--these overlap in ingredients (they both contains dextromethorphan, and the dayquil has a decongestant which wil raise your blood pressure.  Use Tylenol as needed for headache. You may continue the throat spray if needed for symptoms.

## 2021-11-03 NOTE — Progress Notes (Signed)
Start time: 11:20 End time: 11:44  Virtual Visit via Video Note  I connected with [name] on 11/03/21 by a video enabled telemedicine application and verified that I am speaking with the correct person using two identifiers.  Location: Patient: home Provider: office   I discussed the limitations of evaluation and management by telemedicine and the availability of in person appointments. The patient expressed understanding and agreed to proceed.  History of Present Illness:  Chief Complaint  Patient presents with   Cough    VIRTUAL cough and ST, body aches, stuff head and scratchy throat. His wife had flu last week. He did a covid test today and it was negative.    Wife had influenza A 2 weeks ago, f/b a sinus infection. They were on vacation together.  He didn't get sick until 11/6 (Sunday) when returning from trip.  He had been out in the yard, and that day started with head congestion, eyes watering, sneezing, runny nose.  He started taking Vick's Dayquil and night, as well as aspirin. He has h/o allergies.  Just started using Nasacort.  Hadn't taken any Xyzal in a while, took jus tone dose on Sunday, kept him awake that night.  Denies any fever, chills.  He has some aches, but nothing that started suddenly or significant, thinks age-related.  Headache at temples bilaterally, no sinus pain. Nasal drainage is clear. +itchy throat and coughing.  Dayquil helps. Chloraseptic spray helps his throat. Only took xyzal the first night  Last night his breathing was a little heavy, needed albuterol, which helped.  He was sent home from work today due to coughing/sneezing. COVID test negative today.  PMH, PSH, SH reviewed.  HTN, asthma and allergies  Outpatient Encounter Medications as of 11/03/2021  Medication Sig Note   albuterol (VENTOLIN HFA) 108 (90 Base) MCG/ACT inhaler INHALE 2 PUFFS INTO THE LUNGS EVERY 6 HOURS AS NEEDED FOR WHEEZING 11/03/2021: Used yesterday    aspirin 81 MG tablet Take 81 mg by mouth daily.    atorvastatin (LIPITOR) 20 MG tablet Take 1 tablet (20 mg total) by mouth daily.    Cobalamin Combinations (NEURIVA PLUS PO) Take by mouth.    Cyanocobalamin (VITAMIN B 12 PO) Take 2,000 mcg by mouth daily.    donepezil (ARICEPT) 10 MG tablet Take 1 tablet (10 mg total) by mouth every morning.    fluticasone furoate-vilanterol (BREO ELLIPTA) 200-25 MCG/INH AEPB Inhale 1 puff into the lungs daily.    losartan-hydrochlorothiazide (HYZAAR) 50-12.5 MG tablet Take 1 tablet by mouth daily.    Multiple Vitamin (MULTIVITAMIN) tablet Take 1 tablet by mouth daily.     Testosterone (ANDROGEL PUMP) 20.25 MG/ACT (1.62%) GEL Place 2 Pump onto the skin daily.    acyclovir (ZOVIRAX) 200 MG capsule TAKE 2 CAPSULES BY MOUTH TWICE DAILY FOR PREVENTION (Patient not taking: Reported on 11/03/2021) 11/03/2021: Takes when he has a flare.   levocetirizine (XYZAL) 5 MG tablet Take 1 tablet (5 mg total) by mouth every evening. (Patient not taking: Reported on 11/03/2021) 11/03/2021: Takes in the spring or in the fall, prn.   [DISCONTINUED] Multiple Vitamins-Minerals (ICAPS AREDS 2 PO) Take by mouth.  11/25/2020: Iron   No facility-administered encounter medications on file as of 11/03/2021.   Allergies  Allergen Reactions   Ace Inhibitors Swelling   Codeine Nausea And Vomiting    ROS:  See HPI. No f/c/n/v/d/rashes No urinary complaints. No chest pain   Observations/Objective:  Temp 98.7 F (37.1 C) (Oral)  Ht 6\' 1"  (1.854 m)   Wt 230 lb (104.3 kg)   BMI 30.34 kg/m   Well-appearing elderly male in no distress. No throat-clearing or coughing, sounds congested. Wife is coughing a lot in the background. He is alert, oriented, in good spirits, speaking easily in no distress Exam is limited due to virtual nature of the visit.  Assessment and Plan:  Seasonal allergic rhinitis, unspecified trigger - vs viral illness (not flu or COVID).  Regular use of nasal  steroid and antihistamine. Mucinex DM. Avoid Dayquil/Nyquil  Essential hypertension - been taking Dayquil, hasn't monitored BP.  To stop taking, since going to start Mucinex DM, and overlap of DM ingredient.  Supportive measures reviewed.  Off work today and tomorrow.  If no fever, and not feeling worse, can return (with mask if still coughing) on Friday.  To contact us if feeling worse, may need extended note. If feeling worse, may consider repeat COVID test at home.   Follow Up Instructions:    I discussed the assessment and treatment plan with the patient. The patient was provided an opportunity to ask questions and all were answered. The patient agreed with the plan and demonstrated an understanding of the instructions.   The patient was advised to call back or seek an in-person evaluation if the symptoms worsen or if the condition fails to improve as anticipated.  I spent 27 minutes dedicated to the care of this patient, including pre-visit review of records, face to face time, post-visit ordering of testing and documentation.    Vikki Ports, MD

## 2021-11-04 ENCOUNTER — Telehealth: Payer: Managed Care, Other (non HMO) | Admitting: Family Medicine

## 2021-11-11 ENCOUNTER — Encounter: Payer: Self-pay | Admitting: Family Medicine

## 2021-11-11 ENCOUNTER — Other Ambulatory Visit: Payer: Self-pay

## 2021-11-11 ENCOUNTER — Ambulatory Visit: Payer: Managed Care, Other (non HMO) | Admitting: Family Medicine

## 2021-11-11 VITALS — BP 102/66 | HR 56 | Temp 98.4°F | Wt 233.4 lb

## 2021-11-11 DIAGNOSIS — E291 Testicular hypofunction: Secondary | ICD-10-CM

## 2021-11-11 NOTE — Progress Notes (Signed)
   Subjective:    Patient ID: [name], male    DOB: 17-Jun-1956, 65 y.o.   MRN: [id number]  HPI He is here for a recheck.  With his complete exam he related the fact that he was not using testosterone on a regular basis.  Now he has been using 2 pumps per day regularly.  He states that he cannot really tell a difference.   Review of Systems     Objective:   Physical Exam Alert and in no distress otherwise not examined       Assessment & Plan:  Hypogonadism male - Plan: Testosterone

## 2021-11-12 ENCOUNTER — Telehealth: Payer: Self-pay

## 2021-11-12 DIAGNOSIS — E291 Testicular hypofunction: Secondary | ICD-10-CM

## 2021-11-12 LAB — TESTOSTERONE: Testosterone: 584 ng/dL (ref 264–916)

## 2021-11-12 MED ORDER — TESTOSTERONE 20.25 MG/ACT (1.62%) TD GEL: 2.0000 | Freq: Every day | TRANSDERMAL | 5 refills | Status: DC

## 2021-11-12 NOTE — Telephone Encounter (Signed)
Pt call about refill on his testosterone. Please advise [facility]

## 2021-11-12 NOTE — Telephone Encounter (Signed)
Pt. Called stating he saw you yesterday and you your going to refill his testosterone but he never got a message stating it was refilled anywhere. He said if you could send that in to his mail order its cheaper through that one.

## 2021-11-18 ENCOUNTER — Telehealth: Payer: Self-pay

## 2021-11-18 NOTE — Telephone Encounter (Signed)
P.A. TESTOSTERONE GEL  °

## 2021-11-26 NOTE — Telephone Encounter (Signed)
PA approved.

## 2021-11-29 NOTE — Telephone Encounter (Signed)
Called pt and informed

## 2021-11-30 ENCOUNTER — Telehealth (INDEPENDENT_AMBULATORY_CARE_PROVIDER_SITE_OTHER): Payer: Managed Care, Other (non HMO) | Admitting: Adult Health

## 2021-11-30 DIAGNOSIS — Z9989 Dependence on other enabling machines and devices: Secondary | ICD-10-CM

## 2021-11-30 DIAGNOSIS — Z789 Other specified health status: Secondary | ICD-10-CM | POA: Diagnosis not present

## 2021-11-30 DIAGNOSIS — G4733 Obstructive sleep apnea (adult) (pediatric): Secondary | ICD-10-CM | POA: Diagnosis not present

## 2021-11-30 NOTE — Progress Notes (Signed)
PATIENT: [name] DOB: 08-27-1956  REASON FOR VISIT: follow up HISTORY FROM: patient  Virtual Visit via Video Note  I connected with Nell Range on 11/30/21 at  2:15 PM EST by a video enabled telemedicine application located remotely at [facility] and verified that I am speaking with the correct person using two identifiers who was located at their own home.   I discussed the limitations of evaluation and management by telemedicine and the availability of in person appointments. The patient expressed understanding and agreed to proceed.   PATIENT: [name] DOB: 03/05/1956  REASON FOR VISIT: follow up HISTORY FROM: patient  HISTORY OF PRESENT ILLNESS: Today 11/30/21:  Mr.Ellery is a 65 year old male with a history of obstructive sleep apnea followed by Dr. Brett Fairy.  Patient also sees Dr. Lavell Anchors for memory disturbance.  Patient states that he cannot tolerate the CPAP.  States that most the time he takes it off during the night.  He was sent to Dr. Redmond Baseman for inspire device but did not meet the qualifications due to his AHI being 14.  The patient states that the dental device is too expensive.  HISTORY 09/14/2021; Patient here for follow up on Alzheimer's dementia formally diagnosed by neurocognitive testing Dr. Sima Matas. Follow up testing requeste in 9 months from 05/12/2019 date of diagnosis with Dr. Sima Matas. He got a new cpap machine but having difficu,ty wearing the mask, think it is really important to get his sleep apnea controlled before restesting with dr. Sima Matas.    Patient saw amy Lomax on 06/02/2021, she discussed increase to Aricept10mg  in the future if well tolerated. I will reach out to Dr Dohmeier regarding ability to reset current machine for auto titration based on last HST. Amy was concerned about the age of his CPAP machine, however, he does not feel he can afford a new machine. Not a candidate for Inspire and dental device is too  expensive. We have reviewed memory compensation strategies and adverse effects of untreated OSA again today with patient   HPI 11/2020:  [name] is a 65 y.o. male here as requested by Denita Lung, MD for memory difficulties.  Past medical history hypertension, obstructive sleep apnea, mild persistent asthma, brown tongue, hypogonadism, obesity status post surgery, diverticulosis, A. fib, former smoker. I reviewed Dr. Lanice Shirts notes: At prior appointment he complained of decreased memory, having more difficulty at work and also noted some of his colleagues have commented upon his lack of being as sharp as he has been in the past, he has a history of hypogonadism but did not start taking the AndroGel when it was called in last May, he has had some angioedema in from ACE and switched to losartan, he does have a history of obstructive sleep apnea not on CPAP as he has done well by losing a lot of weight, he does have a history of LAP-BAND surgery, previous history of A. fib, sent for evaluation.   He is here with his wife. He travels a lot. It seems like more and more he can't remember their names or who they are for a few moments or until someone reminds him. He forgets where he puts things. His wife provides much information and she says it was several times they were driving and he panicked and thought he had to turn for 3-5 minutes he remembers these episodes and says he feels like he is lost. Worsening and more noticeable for a year. But ongoing for longer. He  struggles with people's names but then it will pop up. He asks his wife a lot where things are like she put it somewhere different. Patient pays the bills, he set them up automatically, he forgets to pay some bills but pays it when he gets the notice. He gets frustrated when his wife tries to help. He asks the same things over sometimes. He didn't know what day it was today. The things that bothers him the most is that he forgets people's  names. Or he needs to reference someone and he doesn't know the name, if he dwells on it a while he can remember it. 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 He feels he is making more errors. No history of significant alcohol or drug use. He is a former smoker but not heavy socially and that is 40 years ago. He lost a lot of weight and they did another sleep evaluation, he can fall asleep if he sits in a recliner. Been at least 10 years since last sleep test. He does not feel sad or feel depressed but he feels upset and mad that he can;t remember things. He doesn't know a lot about his family. No hallucinations or delusions. He does feel that as he gets older he says things he wouldn't in the past just because he doesn't care anymore what people think. When they go out to eat for example he gets more irritated and he has left the restaurant because of some perceived slight. He gets "short" with people more often.    Reviewed notes, labs and imaging from outside physicians, which showed: See above  REVIEW OF SYSTEMS: Out of a complete 14 system review of symptoms, the patient complains only of the following symptoms, and all other reviewed systems are negative.  ALLERGIES: Allergies  Allergen Reactions   Ace Inhibitors Swelling   Codeine Nausea And Vomiting    HOME MEDICATIONS: Outpatient Medications Prior to Visit  Medication Sig Dispense Refill   acyclovir (ZOVIRAX) 200 MG capsule TAKE 2 CAPSULES BY MOUTH TWICE DAILY FOR PREVENTION (Patient not taking: Reported on 11/03/2021) 360 capsule 1   albuterol (VENTOLIN HFA) 108 (90 Base) MCG/ACT inhaler INHALE 2 PUFFS INTO THE LUNGS EVERY 6 HOURS AS NEEDED FOR WHEEZING 36 g 1   aspirin 81 MG tablet Take 81 mg by mouth daily.     atorvastatin (LIPITOR) 20 MG tablet Take 1 tablet (20 mg total) by mouth daily. 90 tablet 4   Cobalamin Combinations (NEURIVA PLUS PO) Take by mouth.     Cyanocobalamin (VITAMIN B  12 PO) Take 2,000 mcg by mouth daily.     donepezil (ARICEPT) 10 MG tablet Take 1 tablet (10 mg total) by mouth every morning. 90 tablet 6   fluticasone furoate-vilanterol (BREO ELLIPTA) 200-25 MCG/INH AEPB Inhale 1 puff into the lungs daily. 3 each 3   levocetirizine (XYZAL) 5 MG tablet Take 1 tablet (5 mg total) by mouth every evening. 90 tablet 3   losartan-hydrochlorothiazide (HYZAAR) 50-12.5 MG tablet Take 1 tablet by mouth daily. 90 tablet 3   Multiple Vitamin (MULTIVITAMIN) tablet Take 1 tablet by mouth daily.      Testosterone (ANDROGEL PUMP) 20.25 MG/ACT (1.62%) GEL Place 2 Pump onto the skin daily. 75 g 5   No facility-administered medications prior to visit.    PAST MEDICAL HISTORY: Past Medical History:  Diagnosis Date   Allergy    RHINITIS   Asthma    Atrial fibrillation (  [place])    Diverticular hemorrhage    Dyslipidemia    GERD (gastroesophageal reflux disease)    History of colonic polyps    Hypertension    Hypogonadism male    Obesity    Vitamin D deficiency     PAST SURGICAL HISTORY: Past Surgical History:  Procedure Laterality Date   COLONOSCOPY     ENDOVENOUS ABLATION SAPHENOUS VEIN W/ LASER  02/2011   right leg   LAPAROSCOPIC GASTRIC BANDING  2008   EARLE MD   partial thryoidectomy      FAMILY HISTORY: Family History  Problem Relation Age of Onset   Arthritis Mother    Hypertension Mother    Ulcers Mother    Breast cancer Mother    Emphysema Father    Asthma Maternal Aunt    Heart disease Maternal Uncle    Stroke Maternal Grandmother    Heart disease Paternal Grandmother    Colon cancer Neg Hx    Stomach cancer Neg Hx     SOCIAL HISTORY: Social History   Socioeconomic History   Marital status: Married    Spouse name: [name]   Number of children: 1   Years of education: Not on file   Highest education level: Associate degree: occupational, Hotel manager, or vocational program  Occupational History    Comment: [name]   Tobacco Use   Smoking status: Former    Types: Cigarettes    Quit date: 12/26/1997    Years since quitting: 23.9   Smokeless tobacco: Never  Vaping Use   Vaping Use: Never used  Substance and Sexual Activity   Alcohol use: Yes    Alcohol/week: 1.0 standard drink    Types: 1 Glasses of wine per week    Comment: Monthly   Drug use: No   Sexual activity: Yes  Other Topics Concern   Not on file  Social History Narrative   Lives with wife, 1 son and 1 daughter deceased   [place], 3 a day   Social Determinants of Radio broadcast assistant Strain: Not on file  Food Insecurity: Not on file  Transportation Needs: Not on file  Physical Activity: Not on file  Stress: Not on file  Social Connections: Not on file  Intimate Partner Violence: Not on file      PHYSICAL EXAM Generalized: Well developed, in no acute distress   Neurological examination  Mentation: Alert oriented to time, place, history taking. Follows all commands speech and language fluent   DIAGNOSTIC DATA (LABS, IMAGING, TESTING) - I reviewed patient records, labs, notes, testing and imaging myself where available.  Lab Results  Component Value Date   WBC 7.0 10/08/2021   HGB 13.2 10/08/2021   HCT 41.0 10/08/2021   MCV 83 10/08/2021   PLT 271 10/08/2021      Component Value Date/Time   NA 142 10/08/2021 1115   K 4.3 10/08/2021 1115   CL 101 10/08/2021 1115   CO2 26 10/08/2021 1115   GLUCOSE 98 10/08/2021 1115   GLUCOSE 94 09/18/2017 0831   BUN 13 10/08/2021 1115   CREATININE 1.31 (H) 10/08/2021 1115   CREATININE 1.06 09/18/2017 0831   CALCIUM 9.2 10/08/2021 1115   PROT 7.6 10/08/2021 1115   ALBUMIN 4.3 10/08/2021 1115   AST 31 10/08/2021 1115   ALT 22 10/08/2021 1115   ALKPHOS 81 10/08/2021 1115   BILITOT 0.2 10/08/2021 1115   GFRNONAA 65 11/25/2020 0829   GFRAA 76 11/25/2020 0829   Lab Results  Component Value Date   CHOL 181 10/08/2021   HDL 49 10/08/2021   LDLCALC 115 (H) 10/08/2021    TRIG 93 10/08/2021   CHOLHDL 3.7 10/08/2021   Lab Results  Component Value Date   HGBA1C 5.6 05/31/2017   Lab Results  Component Value Date   VITAMINB12 >2000 (H) 09/14/2021   Lab Results  Component Value Date   TSH 2.830 09/16/2020      ASSESSMENT AND PLAN 65 y.o. year old male  has a past medical history of Allergy, Asthma, Atrial fibrillation ([place]), Diverticular hemorrhage, Dyslipidemia, GERD (gastroesophageal reflux disease), History of colonic polyps, Hypertension, Hypogonadism male, Obesity, and Vitamin D deficiency. here with:  1.  Obstructive sleep apnea on CPAP  The patient cannot tolerate CPAP therapy.  He was sent for evaluation for inspire but did not qualify due to his AHI being 14. patient states dental device is too expensive for him.  Advised that I would make Dr. Brett Fairy aware and let him know if she has any different recommendations.  Patient voiced understanding    Ward Givens, MSN, NP-C 11/30/2021, 7:50 AM [facility] [address], [place], [place] [address] [phone]

## 2021-12-01 ENCOUNTER — Other Ambulatory Visit: Payer: Self-pay | Admitting: Family Medicine

## 2021-12-01 DIAGNOSIS — J4520 Mild intermittent asthma, uncomplicated: Secondary | ICD-10-CM

## 2021-12-01 NOTE — Telephone Encounter (Signed)
[facility] is requesting to fill pt albuterol inhaler. Please advise [facility]

## 2021-12-13 ENCOUNTER — Other Ambulatory Visit: Payer: Self-pay | Admitting: Family Medicine

## 2021-12-15 ENCOUNTER — Telehealth: Payer: Self-pay

## 2021-12-15 NOTE — Progress Notes (Signed)
I agree with the assessment and plan, as directed and discussed with NP. Larey Seat, MD

## 2021-12-15 NOTE — Telephone Encounter (Signed)
Pt called to find out if he had to do a cologuard test. Pt is schedule for his colonoscopy and was contacted by Dr. Carlean Purl office advising he will be scheduled next year. Pt was advised to disregard the note from [place]. [place]

## 2021-12-31 ENCOUNTER — Other Ambulatory Visit: Payer: Self-pay | Admitting: Family Medicine

## 2021-12-31 DIAGNOSIS — Z8619 Personal history of other infectious and parasitic diseases: Secondary | ICD-10-CM

## 2021-12-31 NOTE — Telephone Encounter (Signed)
[facility] is requesting to fill pt acyclovir. Please advise KH 

## 2022-02-01 ENCOUNTER — Other Ambulatory Visit: Payer: Self-pay

## 2022-02-01 ENCOUNTER — Encounter: Payer: Managed Care, Other (non HMO) | Attending: Psychology | Admitting: Psychology

## 2022-02-01 ENCOUNTER — Encounter: Payer: Self-pay | Admitting: Psychology

## 2022-02-01 DIAGNOSIS — G4733 Obstructive sleep apnea (adult) (pediatric): Secondary | ICD-10-CM | POA: Insufficient documentation

## 2022-02-01 DIAGNOSIS — F0280 Dementia in other diseases classified elsewhere without behavioral disturbance: Secondary | ICD-10-CM | POA: Insufficient documentation

## 2022-02-01 DIAGNOSIS — R413 Other amnesia: Secondary | ICD-10-CM | POA: Diagnosis not present

## 2022-02-01 DIAGNOSIS — G300 Alzheimer's disease with early onset: Secondary | ICD-10-CM | POA: Diagnosis not present

## 2022-02-01 NOTE — Progress Notes (Signed)
02/01/2022 9 AM-10 AM:  Today's visit was a follow-up visit related to previous neuropsychological testing and preliminary diagnosis of dementia early onset of the Alzheimer's type without significant behavioral disturbance.  This is a 1 year follow-up.  The patient returns along with his wife Roland.  The patient acknowledges apparent mild progression reporting that he is having more memory difficulties and has to work harder to try to remember things when he is trying to get projects done in various activities in life.  The patient's wife reports that she is noticing more memory difficulties and personality changes including being more short tempered and agitated but also more verbal and outgoing that he has been in the past and that he will talk about personal information to people that he would not normally have talked with our provided such information.  The patient is described as also having started ordering things on the Internet and when he gets them he does not remember placing the orders.  The patient is also not been able to tolerate his CPAP/AutoPap but is increasingly willing to give it another significant try.  He tried to qualify for the inspire device but did not meet requirements.  The patient reports that he will try using the CPAP every night.  His wife reports that he did try using every night for period of time but 90% of the time he does not attempt to use it now.  The patient does continue to be followed by neurology for his sleep apnea.  As this may be playing some role in his cognitive difficulties we wanted to give it 1 last try to address.  I have recommended the patient talk with an ENT about other possible surgical interventions and whether here would or would not qualify.  The patient is going to try to use the CPAP device again as well.  We have set the patient up for formal testing in approximately 3 months but if he has any interventions around his sleep apnea I could have a  benefit to him we will adjust the time for the testing.  We will hold off on the testing for about 3 months to see if he can better address his sleep apnea as they could have a clouding or complicating the impact on interpreting of test data.

## 2022-02-13 ENCOUNTER — Encounter: Payer: Self-pay | Admitting: Family Medicine

## 2022-02-14 ENCOUNTER — Other Ambulatory Visit: Payer: Self-pay | Admitting: Family Medicine

## 2022-02-14 NOTE — Telephone Encounter (Signed)
[facility] is requesting to fill pt breo inhaler. Please advise [facility]

## 2022-02-16 ENCOUNTER — Other Ambulatory Visit: Payer: Self-pay

## 2022-02-16 ENCOUNTER — Encounter: Payer: Self-pay | Admitting: Family Medicine

## 2022-02-16 ENCOUNTER — Ambulatory Visit: Payer: Managed Care, Other (non HMO) | Admitting: Family Medicine

## 2022-02-16 VITALS — BP 122/82 | HR 60 | Temp 98.3°F | Ht 72.5 in | Wt 235.0 lb

## 2022-02-16 DIAGNOSIS — G4733 Obstructive sleep apnea (adult) (pediatric): Secondary | ICD-10-CM

## 2022-02-16 DIAGNOSIS — M25561 Pain in right knee: Secondary | ICD-10-CM | POA: Diagnosis not present

## 2022-02-16 DIAGNOSIS — Z9989 Dependence on other enabling machines and devices: Secondary | ICD-10-CM | POA: Diagnosis not present

## 2022-02-16 NOTE — Progress Notes (Signed)
° °  Subjective:    Patient ID: [name], male    DOB: [birth year], 66 y.o.   MRN: [id number]  HPI He originally made the appointment for difficulty with right knee but then proceeded to discuss drying of the nasal mucosa.  He does have a history of OSA and is on CPAP.  He apparently is having difficulty with nasal congestion after he puts the CPAP on.  He describes blowing of air when he opens his mouth.  He apparently does not have a chinstrap. He also complains of a 4-week history of right medial knee pain.  No history of injury or overuse, popping, locking or grinding.   Review of Systems     Objective:   Physical Exam Right knee exam does show tenderness palpation along the medial joint line.  McMurray's testing was negative.  Negative anterior drawer.  Medial and lateral collateral ligaments intact.       Assessment & Plan:  Acute pain of right knee  OSA on CPAP I explained that I thought the knee pain was probably meniscal damage and recommend conservative care with heat, pain med of choice and I will also send him for physical therapy for rehab.  Described further evaluation including x-rays possible injection and referral to orthopedics if continued difficulty. Then discussed the symptoms of his nasal drying.  Explained that what he might need is a chinstrap to keep his mouth closed which should hopefully help eliminate the nasal drying as he is using heated humidity for his CPAP.  He will discuss this with the DME company.

## 2022-02-16 NOTE — Patient Instructions (Signed)
Heat for 20 minutes 3 times per day.  2 Aleve twice per day for the next couple of weeks

## 2022-02-17 ENCOUNTER — Encounter: Payer: Self-pay | Admitting: Family Medicine

## 2022-02-21 NOTE — Telephone Encounter (Signed)
noted 

## 2022-02-24 ENCOUNTER — Ambulatory Visit: Payer: Managed Care, Other (non HMO) | Admitting: Rehabilitative and Restorative Service Providers"

## 2022-02-24 ENCOUNTER — Other Ambulatory Visit: Payer: Self-pay

## 2022-02-24 ENCOUNTER — Encounter: Payer: Self-pay | Admitting: Rehabilitative and Restorative Service Providers"

## 2022-02-24 DIAGNOSIS — R600 Localized edema: Secondary | ICD-10-CM | POA: Diagnosis not present

## 2022-02-24 DIAGNOSIS — M25561 Pain in right knee: Secondary | ICD-10-CM

## 2022-02-24 DIAGNOSIS — R262 Difficulty in walking, not elsewhere classified: Secondary | ICD-10-CM

## 2022-02-24 DIAGNOSIS — M6281 Muscle weakness (generalized): Secondary | ICD-10-CM

## 2022-02-24 NOTE — Therapy (Signed)
?OUTPATIENT PHYSICAL THERAPY LOWER EXTREMITY EVALUATION ? ? ?Patient Name: [name] ?MRN: [id number] ?DOB:18-Aug-1956, 66 y.o., male ?Today's Date: 02/24/2022 ? ? PT End of Session - 02/24/22 9147   ? ? Visit Number 1   ? Number of Visits 12   ? Date for PT Re-Evaluation 04/21/22   ? Authorization - Number of Visits 60   ? Progress Note Due on Visit 10   ? PT Start Time [phone]   ? PT Stop Time 6213   ? PT Time Calculation (min) 47 min   ? Activity Tolerance Patient tolerated treatment well;No increased pain   ? Behavior During Therapy [facility] for tasks assessed/performed   ? ?  ?  ? ?  ? ? ?Past Medical History:  ?Diagnosis Date  ? Allergy   ? RHINITIS  ? Asthma   ? Atrial fibrillation ([place])   ? Diverticular hemorrhage   ? Dyslipidemia   ? GERD (gastroesophageal reflux disease)   ? History of colonic polyps   ? Hypertension   ? Hypogonadism male   ? Obesity   ? Vitamin D deficiency   ? ?Past Surgical History:  ?Procedure Laterality Date  ? COLONOSCOPY    ? ENDOVENOUS ABLATION SAPHENOUS VEIN W/ LASER  02/2011  ? right leg  ? LAPAROSCOPIC GASTRIC BANDING  2008  ? EARLE MD  ? partial thryoidectomy    ? ?Patient Active Problem List  ? Diagnosis Date Noted  ? OSA on CPAP 01/14/2021  ? Memory difficulties 01/14/2021  ? Mild persistent asthma without complication 08/65/7846  ? Erectile dysfunction 12/12/2017  ? History of adenomatous polyp of colon 09/18/2017  ? Former smoker, stopped smoking in distant past 09/18/2017  ? History of diverticulosis 04/04/2016  ? 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 Hypogonadism male 03/26/2012  ? Obesity 10/08/2007  ? Essential hypertension 10/08/2007  ? Allergic rhinitis 10/08/2007  ? History of atrial fibrillation 10/08/2007  ? ? ?PCP: Denita Lung, MD ? ?REFERRING PROVIDER: Denita Lung, MD ? ?REFERRING DIAG: M25.561 (ICD-10-CM) - Acute pain of right knee  ? ? ?THERAPY DIAG:  ?Difficulty in walking, not elsewhere classified ? ?Muscle weakness (generalized) ? ?Localized edema ? ?Acute pain  of right knee ? ?ONSET DATE: About a month ago ? ?SUBJECTIVE:  ? ?SUBJECTIVE STATEMENT: ?[name] notes his medial knee pain has been subsiding since he started taking Aleve a month ago.  He is still concerned about being able to return to yard work and more physically demanding activities without re-aggravating his knee. ? ?PERTINENT HISTORY: ?Asthma, HTN, cardiac history, former smoker ? ?PAIN:  ?Are you having pain? No ?NPRS scale: Over the past week 0-4/10 ?Pain location: R medial knee ?Pain orientation: Right and Medial  ?PAIN TYPE: aching ?Pain description: intermittent  ?Aggravating factors: Sit to stand and with prolonged weight bearing ?Relieving factors: Aleve ? ?PRECAUTIONS: None ? ?WEIGHT BEARING RESTRICTIONS No ? ?FALLS:  ?Has patient fallen in last 6 months? No, Number of falls: 0 ? ?OCCUPATION: Retired ? ?PLOF: Independent ? ?PATIENT GOALS Return to yard work and full function without reaggravation ? ? ?OBJECTIVE:  ? ?DIAGNOSTIC FINDINGS: [name] has noticeable thigh atrophy on his affected R leg.  AROM is good and his symptoms have improved since starting Aleve. ? ?PATIENT SURVEYS:  ?FOTO 51 (Goal 71 in 12 visits) ? ?COGNITION: ? Overall cognitive status: Within functional limits for tasks assessed   ?  ? ?PALPATION: ?No joint line tenderness nor tenderness of pes anserine. ? ?LE AROM/PROM: ? ?  AROM Right ?02/24/2022 Left ?02/24/2022  ?Hip flexion    ?Hip extension    ?Hip abduction    ?Hip adduction    ?Hip internal rotation    ?Hip external rotation    ?Knee flexion 130 131  ?Knee extension -1 0  ?Ankle dorsiflexion    ?Ankle plantarflexion    ?Ankle inversion    ?Ankle eversion    ? (Blank rows = not tested) ? ?LE MMT: ? ?Thigh girth assessment Right ?02/24/2022 Left ?02/24/2022  ?15 cm above superior patellar pole 58 cm 60 cm  ?Hip extension    ?Hip abduction    ?Hip adduction    ?Hip internal rotation    ?Hip external rotation    ?Knee flexion    ?Knee extension    ?Ankle dorsiflexion    ?Ankle plantarflexion     ?Ankle inversion    ?Ankle eversion    ? (Blank rows = not tested) ? ? ?TODAY'S TREATMENT: ? ?Exercises ?Supine Quadricep Sets - 2 sets of 10 5 second hold (toes back, press knees down and tighten thighs ?Seated Straight Leg Raise - 3 sets of 5 ?Tailgate Knee Flexion AROM - 20-30 reps ? ? ? ?PATIENT EDUCATION:  ?Education details: Reviewed exam findings, knee anatomy and HEP. ?Person educated: Patient ?Education method: Explanation, Demonstration, Tactile cues, Verbal cues, and Handouts ?Education comprehension: verbalized understanding, returned demonstration, verbal cues required, tactile cues required, and needs further education ? ? ?HOME EXERCISE PROGRAM: ?Access Code: [email or web address] ?URL: [email or web address] ?Date: 02/24/2022 ?Prepared by: Vista Mink ? ?Exercises ?Supine Quadricep Sets - 2-3 x daily - 7 x weekly - 2-3 sets - 10 reps - 5 second hold ?Seated Straight Leg Raise - 2 x daily - 7 x weekly - 3-5 sets - 5 reps - 3 seconds hold ?Tailgate Knee Flexion AAROM - 3 x daily - 7 x weekly - 1 sets - 20-30 reps ? ? ?ASSESSMENT: ? ?CLINICAL IMPRESSION: ?Patient is a 66 y.o. Male who was seen today for physical therapy evaluation and treatment for R knee pain.  R thigh atrophy and medial joint pain are consistent with OA.  He will benefit from physical therapy focused on quadriceps strengthening to improve WB function, endurance and difficulty with sit to stand.  His prognosis is good with the recommended POC. ? ? ?OBJECTIVE IMPAIRMENTS decreased activity tolerance, decreased endurance, decreased knowledge of condition, difficulty walking, decreased strength, increased edema, impaired perceived functional ability, and pain.  ? ?ACTIVITY LIMITATIONS community activity and yard work.  ? ?PERSONAL FACTORS Asthma, HTN, cardiac history, former smoker are also affecting patient's functional outcome.  ? ? ?REHAB POTENTIAL: Good ? ?CLINICAL DECISION MAKING: Stable/uncomplicated ? ?EVALUATION COMPLEXITY:  Low ? ? ?GOALS: ?Goals reviewed with patient? Yes ? ?SHORT TERM GOALS: ? ?[name] will report no R knee pain with no pain medication needed. ?Baseline: Improved with Aleve. ?Target date: 03/24/2022 ?Goal status: INITIAL ?LONG TERM GOALS: ? ?Improve FOTO to 71 in 12 visits. ?Baseline: 51 ?Target date: 04/07/2022 ?Goal status: INITIAL ? ?2.  Improve R quadriceps strength as assessed by decreased thigh atrophy and return to yard work without pain. ?Baseline: Pain affects yard work and 2 cm of thigh atrophy assessed 15 cm proximal to the superior patellar pole. ?Target date: 04/07/2022 ?Goal status: INITIAL ? ?3.  [name] will be independent with his long-term HEP at DC. ?Baseline: Started today. ?Target date: 04/07/2022 ?Goal status: INITIAL ? ?PLAN: ?PT FREQUENCY: 1-2x/week ? ?PT DURATION: 6 weeks ? ?PLANNED INTERVENTIONS: Therapeutic exercises, Therapeutic activity, Neuromuscular  re-education, Balance training, Gait training, Patient/Family education, Stair training, Electrical stimulation, Cryotherapy, Vasopneumatic device, and Manual therapy ? ?PLAN FOR NEXT SESSION: Review HEP, emphasis on HEP activities with select strengthening activities in the clinic (1X/week visits). ? ? ?Farley Ly, PT ?02/24/2022, 2:25 PM  ?

## 2022-03-10 ENCOUNTER — Encounter: Payer: Self-pay | Admitting: Physical Therapy

## 2022-03-10 ENCOUNTER — Encounter: Payer: Self-pay | Admitting: Family Medicine

## 2022-03-10 ENCOUNTER — Other Ambulatory Visit: Payer: Self-pay

## 2022-03-10 ENCOUNTER — Ambulatory Visit: Payer: Managed Care, Other (non HMO) | Admitting: Physical Therapy

## 2022-03-10 DIAGNOSIS — R262 Difficulty in walking, not elsewhere classified: Secondary | ICD-10-CM | POA: Diagnosis not present

## 2022-03-10 DIAGNOSIS — M6281 Muscle weakness (generalized): Secondary | ICD-10-CM

## 2022-03-10 DIAGNOSIS — R600 Localized edema: Secondary | ICD-10-CM

## 2022-03-10 DIAGNOSIS — M25561 Pain in right knee: Secondary | ICD-10-CM | POA: Diagnosis not present

## 2022-03-10 NOTE — Therapy (Signed)
?OUTPATIENT PHYSICAL THERAPY LOWER EXTREMITY EVALUATION ? ? ?Patient Name: [name] ?MRN: [id number] ?DOB:[birth year], 66 y.o., male ?Today's Date: 03/10/2022 ? ? PT End of Session - 03/10/22 1516   ? ? Visit Number 2   ? Number of Visits 12   ? Date for PT Re-Evaluation 04/21/22   ? Authorization - Number of Visits 60   ? Progress Note Due on Visit 10   ? PT Start Time 1512   ? PT Stop Time 7169   ? PT Time Calculation (min) 43 min   ? Activity Tolerance Patient tolerated treatment well;No increased pain   ? Behavior During Therapy [facility] for tasks assessed/performed   ? ?  ?  ? ?  ? ? ?Past Medical History:  ?Diagnosis Date  ? Allergy   ? RHINITIS  ? Asthma   ? Atrial fibrillation ([place])   ? Diverticular hemorrhage   ? Dyslipidemia   ? GERD (gastroesophageal reflux disease)   ? History of colonic polyps   ? Hypertension   ? Hypogonadism male   ? Obesity   ? Vitamin D deficiency   ? ?Past Surgical History:  ?Procedure Laterality Date  ? COLONOSCOPY    ? ENDOVENOUS ABLATION SAPHENOUS VEIN W/ LASER  02/2011  ? right leg  ? LAPAROSCOPIC GASTRIC BANDING  2008  ? EARLE MD  ? partial thryoidectomy    ? ?Patient Active Problem List  ? Diagnosis Date Noted  ? OSA on CPAP 01/14/2021  ? Memory difficulties 01/14/2021  ? Mild persistent asthma without complication 67/89/3810  ? Erectile dysfunction 12/12/2017  ? History of adenomatous polyp of colon 09/18/2017  ? Former smoker, stopped smoking in distant past 09/18/2017  ? History of diverticulosis 04/04/2016  ? 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 Hypogonadism male 03/26/2012  ? Obesity 10/08/2007  ? Essential hypertension 10/08/2007  ? Allergic rhinitis 10/08/2007  ? History of atrial fibrillation 10/08/2007  ? ? ?PCP: Denita Lung, MD ? ?REFERRING PROVIDER: Denita Lung, MD ? ?REFERRING DIAG: M25.561 (ICD-10-CM) - Acute pain of right knee  ? ? ?THERAPY DIAG:  ?Difficulty in walking, not elsewhere classified ? ?Muscle weakness (generalized) ? ?Localized edema ? ?Acute  pain of right knee ? ?ONSET DATE: About a month ago ? ?SUBJECTIVE:  ? ?SUBJECTIVE STATEMENT: ?[name] notes less overall pain, the exercises are helping and no pain upon arrival today. ? ?PERTINENT HISTORY: ?Asthma, HTN, cardiac history, former smoker ? ?PAIN:  ?Are you having pain? No ?NPRS scale: Over the past week 0-4/10 ?Pain location: R medial knee ?Pain orientation: Right and Medial  ?PAIN TYPE: aching ?Pain description: intermittent  ?Aggravating factors: Sit to stand and with prolonged weight bearing ?Relieving factors: Aleve ? ?PRECAUTIONS: None ? ?WEIGHT BEARING RESTRICTIONS No ? ?FALLS:  ?Has patient fallen in last 6 months? No, Number of falls: 0 ? ?OCCUPATION: Retired ? ?PLOF: Independent ? ?PATIENT GOALS Return to yard work and full function without reaggravation ? ? ?OBJECTIVE:  ? ?DIAGNOSTIC FINDINGS: [name] has noticeable thigh atrophy on his affected R leg.  AROM is good and his symptoms have improved since starting Aleve. ? ?PATIENT SURVEYS:  ?FOTO 51 (Goal 71 in 12 visits) ? ?COGNITION: ? Overall cognitive status: Within functional limits for tasks assessed   ?  ? ?PALPATION: ?No joint line tenderness nor tenderness of pes anserine. ? ?LE AROM/PROM: ? ?AROM Right ?02/24/2022 Left ?02/24/2022  ?Hip flexion    ?Hip extension    ?Hip abduction    ?Hip adduction    ?  Hip internal rotation    ?Hip external rotation    ?Knee flexion 130 131  ?Knee extension -1 0  ?Ankle dorsiflexion    ?Ankle plantarflexion    ?Ankle inversion    ?Ankle eversion    ? (Blank rows = not tested) ? ?LE MMT: ? ?Thigh girth assessment Right ?02/24/2022 Left ?02/24/2022  ?15 cm above superior patellar pole 58 cm 60 cm  ?Hip extension    ?Hip abduction    ?Hip adduction    ?Hip internal rotation    ?Hip external rotation    ?Knee flexion    ?Knee extension    ?Ankle dorsiflexion    ?Ankle plantarflexion    ?Ankle inversion    ?Ankle eversion    ? (Blank rows = not tested) ? ? ?TODAY'S TREATMENT: ?03/10/22 ?Therapeutic Exercise: ? Aerobic:  recumbent bike L3 X 6 min ? Supine: ? Prone: ? Seated: SLR Rt 2X10, seated knee flexion tailgate stretch 10 sec X10 ea ? Standing:6 inch step ups on Rt X 10 fwd and X 10 lateral with one UE support ? Machines:Leg press DL 100# 2X15, then Rt leg only 50# 2X15, leg ext machine 5# up with both down with Rt only 2X15. Hamstring curl machine 25# DL 2X15 ?Neuromuscular Re-education: ?Manual Therapy: ?Therapeutic Activity: ?Self Care: ?Trigger Point Dry Needling:  ?Modalities:  ? ? ?02/24/22 ?Exercises ?Supine Quadricep Sets - 2 sets of 10 5 second hold (toes back, press knees down and tighten thighs ?Seated Straight Leg Raise - 3 sets of 5 ?Tailgate Knee Flexion AROM - 20-30 reps ? ? ? ?PATIENT EDUCATION:  ?Education details: Reviewed exam findings, knee anatomy and HEP. ?Person educated: Patient ?Education method: Explanation, Demonstration, Tactile cues, Verbal cues, and Handouts ?Education comprehension: verbalized understanding, returned demonstration, verbal cues required, tactile cues required, and needs further education ? ? ?HOME EXERCISE PROGRAM: ?Access Code: [email or web address] ?URL: [email or web address] ?Date: 02/24/2022 ?Prepared by: Vista Mink ? ?Exercises ?Supine Quadricep Sets - 2-3 x daily - 7 x weekly - 2-3 sets - 10 reps - 5 second hold ?Seated Straight Leg Raise - 2 x daily - 7 x weekly - 3-5 sets - 5 reps - 3 seconds hold ?Tailgate Knee Flexion AAROM - 3 x daily - 7 x weekly - 1 sets - 20-30 reps ? ? ?ASSESSMENT: ? ?CLINICAL IMPRESSION: ?He is showing good early progress with HEP. I reviewed this again with him with good return demonstration. We used resistance machines today to target more Rt knee strength and he had good tolerance to this.  ? ? ?OBJECTIVE IMPAIRMENTS decreased activity tolerance, decreased endurance, decreased knowledge of condition, difficulty walking, decreased strength, increased edema, impaired perceived functional ability, and pain.  ? ?ACTIVITY LIMITATIONS community  activity and yard work.  ? ?PERSONAL FACTORS Asthma, HTN, cardiac history, former smoker are also affecting patient's functional outcome.  ? ? ?REHAB POTENTIAL: Good ? ?CLINICAL DECISION MAKING: Stable/uncomplicated ? ?EVALUATION COMPLEXITY: Low ? ? ?GOALS: ?Goals reviewed with patient? Yes ? ?SHORT TERM GOALS: ? ?[name] will report no R knee pain with no pain medication needed. ?Baseline: Improved with Aleve. ?Target date: 04/07/2022 ?Goal status: INITIAL ?LONG TERM GOALS: ? ?Improve FOTO to 71 in 12 visits. ?Baseline: 51 ?Target date: 04/21/2022 ?Goal status: INITIAL ? ?2.  Improve R quadriceps strength as assessed by decreased thigh atrophy and return to yard work without pain. ?Baseline: Pain affects yard work and 2 cm of thigh atrophy assessed 15 cm proximal to the superior patellar pole. ?Target date: 04/21/2022 ?Goal  status: INITIAL ? ?3.  [name] will be independent with his long-term HEP at DC. ?Baseline: Started today. ?Target date: 04/21/2022 ?Goal status: INITIAL ? ?PLAN: ?PT FREQUENCY: 1-2x/week ? ?PT DURATION: 6 weeks ? ?PLANNED INTERVENTIONS: Therapeutic exercises, Therapeutic activity, Neuromuscular re-education, Balance training, Gait training, Patient/Family education, Stair training, Electrical stimulation, Cryotherapy, Vasopneumatic device, and Manual therapy ? ?PLAN FOR NEXT SESSION: update HEP PRN, emphasis on HEP activities with select strengthening activities in the clinic (1X/week visits). ? ? ?Debbe Odea, PT,DPT ?03/10/2022, 3:21 PM  ?

## 2022-03-17 ENCOUNTER — Ambulatory Visit: Payer: Managed Care, Other (non HMO) | Admitting: Rehabilitative and Restorative Service Providers"

## 2022-03-17 ENCOUNTER — Other Ambulatory Visit: Payer: Self-pay

## 2022-03-17 ENCOUNTER — Encounter: Payer: Self-pay | Admitting: Rehabilitative and Restorative Service Providers"

## 2022-03-17 DIAGNOSIS — M6281 Muscle weakness (generalized): Secondary | ICD-10-CM | POA: Diagnosis not present

## 2022-03-17 DIAGNOSIS — M25561 Pain in right knee: Secondary | ICD-10-CM

## 2022-03-17 DIAGNOSIS — R262 Difficulty in walking, not elsewhere classified: Secondary | ICD-10-CM | POA: Diagnosis not present

## 2022-03-17 DIAGNOSIS — R600 Localized edema: Secondary | ICD-10-CM | POA: Diagnosis not present

## 2022-03-17 NOTE — Therapy (Signed)
?OUTPATIENT PHYSICAL THERAPY LOWER EXTREMITY EVALUATION ? ? ?Patient Name: [name] ?MRN: [id number] ?DOB:Jul 07, 1956, 66 y.o., male ?Today's Date: 03/17/2022 ? ? PT End of Session - 03/17/22 1521   ? ? Visit Number 3   ? Number of Visits 12   ? Date for PT Re-Evaluation 04/21/22   ? Authorization - Number of Visits 60   ? Progress Note Due on Visit 10   ? PT Start Time 1515   ? PT Stop Time 4650   ? PT Time Calculation (min) 40 min   ? Activity Tolerance Patient tolerated treatment well;No increased pain   ? Behavior During Therapy [facility] for tasks assessed/performed   ? ?  ?  ? ?  ? ? ? ?Past Medical History:  ?Diagnosis Date  ? Allergy   ? RHINITIS  ? Asthma   ? Atrial fibrillation ([place])   ? Diverticular hemorrhage   ? Dyslipidemia   ? GERD (gastroesophageal reflux disease)   ? History of colonic polyps   ? Hypertension   ? Hypogonadism male   ? Obesity   ? Vitamin D deficiency   ? ?Past Surgical History:  ?Procedure Laterality Date  ? COLONOSCOPY    ? ENDOVENOUS ABLATION SAPHENOUS VEIN W/ LASER  02/2011  ? right leg  ? LAPAROSCOPIC GASTRIC BANDING  2008  ? EARLE MD  ? partial thryoidectomy    ? ?Patient Active Problem List  ? Diagnosis Date Noted  ? OSA on CPAP 01/14/2021  ? Memory difficulties 01/14/2021  ? Mild persistent asthma without complication 35/46/5681  ? Erectile dysfunction 12/12/2017  ? History of adenomatous polyp of colon 09/18/2017  ? Former smoker, stopped smoking in distant past 09/18/2017  ? History of diverticulosis 04/04/2016  ? 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 Hypogonadism male 03/26/2012  ? Obesity 10/08/2007  ? Essential hypertension 10/08/2007  ? Allergic rhinitis 10/08/2007  ? History of atrial fibrillation 10/08/2007  ? ? ?PCP: Denita Lung, MD ? ?REFERRING PROVIDER: Denita Lung, MD ? ?REFERRING DIAG: M25.561 (ICD-10-CM) - Acute pain of right knee  ? ? ?THERAPY DIAG:  ?Difficulty in walking, not elsewhere classified ? ?Muscle weakness (generalized) ? ?Localized edema ? ?Acute  pain of right knee ? ?ONSET DATE: About a month ago ? ?SUBJECTIVE:  ? ?SUBJECTIVE STATEMENT: ?[name] notes less overall pain and improved function since starting PT. ? ?PERTINENT HISTORY: ?Asthma, HTN, cardiac history, former smoker ? ?PAIN:  ?Are you having pain? No ?NPRS scale: Over the past week 0-4/10 ?Pain location: R medial knee ?Pain orientation: Right and Medial  ?PAIN TYPE: aching ?Pain description: intermittent  ?Aggravating factors: Sit to stand and with prolonged weight bearing ?Relieving factors: Aleve ? ?PRECAUTIONS: None ? ?WEIGHT BEARING RESTRICTIONS No ? ?FALLS:  ?Has patient fallen in last 6 months? No, Number of falls: 0 ? ?OCCUPATION: Retired ? ?PLOF: Independent ? ?PATIENT GOALS Return to yard work and full function without reaggravation ? ? ?OBJECTIVE:  ? ?DIAGNOSTIC FINDINGS: [name] has noticeable thigh atrophy on his affected R leg.  AROM is good and his symptoms have improved since starting Aleve. ? ?PATIENT SURVEYS:  ?FOTO 51 (Goal 71 in 12 visits) ? ?COGNITION: ? Overall cognitive status: Within functional limits for tasks assessed   ?  ? ?PALPATION: ?No joint line tenderness nor tenderness of pes anserine. ? ?LE AROM/PROM: ? ?AROM Right ?02/24/2022 Left ?02/24/2022  ?Hip flexion    ?Hip extension    ?Hip abduction    ?Hip adduction    ?Hip  internal rotation    ?Hip external rotation    ?Knee flexion 130 131  ?Knee extension -1 0  ?Ankle dorsiflexion    ?Ankle plantarflexion    ?Ankle inversion    ?Ankle eversion    ? (Blank rows = not tested) ? ?LE MMT: ? ?Thigh girth assessment Right ?02/24/2022 Left ?02/24/2022  ?15 cm above superior patellar pole 58 cm 60 cm  ?Hip extension    ?Hip abduction    ?Hip adduction    ?Hip internal rotation    ?Hip external rotation    ?Knee flexion    ?Knee extension    ?Ankle dorsiflexion    ?Ankle plantarflexion    ?Ankle inversion    ?Ankle eversion    ? (Blank rows = not tested) ? ? ?TODAY'S TREATMENT: ?03/17/2022 ? ?Aerobic: Recumbent bike L3 X 5 min Seat  9 ? ?Supine: Quadriceps sets 10X 5 seconds ? ?Seated: SLR 3 sets of 5 (toes back, press knee down and tighten thigh) ?Seated tailgate knee flexion AROM 1 minute ?Knee extension machine (90-40 degrees with slow eccentrics) 35# 15X ? ?Functional Activities (for stairs and sit to stand): Leg press DL 100# 15X, then R leg only 50# 15X slow eccentrics ? ?4 inch step up/down B X 10 slow eccentrics with one UE support ? ?Neuromuscular Re-education: ? ? ?03/10/22 ?Therapeutic Exercise: ? Aerobic: Recumbent bike L3 X 6 min ? Supine: Quadriceps sets 10X 5 seconds ? Prone: ? Seated: SLR 3 sets of 5 ? Seated tailgate knee flexion AROM 1 minutes ?  ?Machines: Leg press DL 100# 2X15, then Rt leg only 50# 2X15, leg ext machine 5# up with both down with Rt only 2X15. Hamstring curl machine 25# DL 2X15 ? ?Neuromuscular Re-education: ?Manual Therapy: ?Therapeutic Activity: ?Self Care: ?Trigger Point Dry Needling:  ?Modalities:  ? ? ?02/24/22 ?Exercises ?Supine Quadricep Sets - 2 sets of 10 5 second hold (toes back, press knees down and tighten thighs ?Seated Straight Leg Raise - 3 sets of 5 ?Tailgate Knee Flexion AROM - 20-30 reps ? ? ?PATIENT EDUCATION:  ?Education details: Reviewed exam findings, knee anatomy and HEP. ?Person educated: Patient ?Education method: Explanation, Demonstration, Tactile cues, Verbal cues, and Handouts ?Education comprehension: verbalized understanding, returned demonstration, verbal cues required, tactile cues required, and needs further education ? ? ?HOME EXERCISE PROGRAM: ?Access Code: [email or web address] ?URL: [email or web address] ?Date: 02/24/2022 ?Prepared by: Vista Mink ? ?Exercises ?Supine Quadricep Sets - 2-3 x daily - 7 x weekly - 2-3 sets - 10 reps - 5 second hold ?Seated Straight Leg Raise - 2 x daily - 7 x weekly - 3-5 sets - 5 reps - 3 seconds hold ?Tailgate Knee Flexion AAROM - 3 x daily - 7 x weekly - 1 sets - 20-30 reps ? ? ?ASSESSMENT: ? ?CLINICAL IMPRESSION: ?[name] reports good  early subjective pain progress.  Significant weakness is noted with R quadriceps strengthening (shaking, fatigue) and R quadriceps strengthening remains a high priority with [facility] physical therapy.  Continue current plan to meet LTGs. ? ? ?OBJECTIVE IMPAIRMENTS decreased activity tolerance, decreased endurance, decreased knowledge of condition, difficulty walking, decreased strength, increased edema, impaired perceived functional ability, and pain.  ? ?ACTIVITY LIMITATIONS community activity and yard work.  ? ?PERSONAL FACTORS Asthma, HTN, cardiac history, former smoker are also affecting patient's functional outcome.  ? ? ?REHAB POTENTIAL: Good ? ?CLINICAL DECISION MAKING: Stable/uncomplicated ? ?EVALUATION COMPLEXITY: Low ? ? ?GOALS: ?Goals reviewed with patient? Yes ? ?SHORT TERM GOALS: ? ?[name] will report no R  knee pain with no pain medication needed. ?Baseline: Improved with Aleve. ?Target date: 04/14/2022 ?Goal status: INITIAL ?LONG TERM GOALS: ? ?Improve FOTO to 71 in 12 visits. ?Baseline: 51 ?Target date: 04/28/2022 ?Goal status: INITIAL ? ?2.  Improve R quadriceps strength as assessed by decreased thigh atrophy and return to yard work without pain. ?Baseline: Pain affects yard work and 2 cm of thigh atrophy assessed 15 cm proximal to the superior patellar pole. ?Target date: 04/28/2022 ?Goal status: INITIAL ? ?3.  [name] will be independent with his long-term HEP at DC. ?Baseline: Started today. ?Target date: 04/28/2022 ?Goal status: INITIAL ? ?PLAN: ?PT FREQUENCY: 1-2x/week ? ?PT DURATION: 6 weeks ? ?PLANNED INTERVENTIONS: Therapeutic exercises, Therapeutic activity, Neuromuscular re-education, Balance training, Gait training, Patient/Family education, Stair training, Electrical stimulation, Cryotherapy, Vasopneumatic device, and Manual therapy ? ?PLAN FOR NEXT SESSION: emphasis on HEP activities with select strengthening and functional activities in the clinic (1X/week visits). ? ? ?Farley Ly, PT,  MPT ?03/17/2022, 3:59 PM  ?

## 2022-03-24 ENCOUNTER — Ambulatory Visit: Payer: Managed Care, Other (non HMO) | Admitting: Rehabilitative and Restorative Service Providers"

## 2022-03-24 ENCOUNTER — Encounter: Payer: Self-pay | Admitting: Rehabilitative and Restorative Service Providers"

## 2022-03-24 DIAGNOSIS — M25561 Pain in right knee: Secondary | ICD-10-CM | POA: Diagnosis not present

## 2022-03-24 DIAGNOSIS — R262 Difficulty in walking, not elsewhere classified: Secondary | ICD-10-CM

## 2022-03-24 DIAGNOSIS — M6281 Muscle weakness (generalized): Secondary | ICD-10-CM

## 2022-03-24 DIAGNOSIS — R600 Localized edema: Secondary | ICD-10-CM

## 2022-03-24 NOTE — Therapy (Signed)
?OUTPATIENT PHYSICAL THERAPY LOWER EXTREMITY EVALUATION ? ? ?Patient Name: [name] ?MRN: [id number] ?DOB:[birth year], 66 y.o., male ?Today's Date: 03/24/2022 ? ? PT End of Session - 03/24/22 1558   ? ? Visit Number 4   ? Number of Visits 12   ? Date for PT Re-Evaluation 04/21/22   ? Authorization - Number of Visits 60   ? Progress Note Due on Visit 10   ? PT Start Time 3664   ? PT Stop Time 1600   ? PT Time Calculation (min) 43 min   ? Activity Tolerance Patient tolerated treatment well;No increased pain   ? Behavior During Therapy [facility] for tasks assessed/performed   ? ?  ?  ? ?  ? ? ? ? ?Past Medical History:  ?Diagnosis Date  ? Allergy   ? RHINITIS  ? Asthma   ? Atrial fibrillation ([place])   ? Diverticular hemorrhage   ? Dyslipidemia   ? GERD (gastroesophageal reflux disease)   ? History of colonic polyps   ? Hypertension   ? Hypogonadism male   ? Obesity   ? Vitamin D deficiency   ? ?Past Surgical History:  ?Procedure Laterality Date  ? COLONOSCOPY    ? ENDOVENOUS ABLATION SAPHENOUS VEIN W/ LASER  02/2011  ? right leg  ? LAPAROSCOPIC GASTRIC BANDING  2008  ? EARLE MD  ? partial thryoidectomy    ? ?Patient Active Problem List  ? Diagnosis Date Noted  ? OSA on CPAP 01/14/2021  ? Memory difficulties 01/14/2021  ? Mild persistent asthma without complication 40/34/7425  ? Erectile dysfunction 12/12/2017  ? History of adenomatous polyp of colon 09/18/2017  ? Former smoker, stopped smoking in distant past 09/18/2017  ? History of diverticulosis 04/04/2016  ? 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 Hypogonadism male 03/26/2012  ? Obesity 10/08/2007  ? Essential hypertension 10/08/2007  ? Allergic rhinitis 10/08/2007  ? History of atrial fibrillation 10/08/2007  ? ? ?PCP: Denita Lung, MD ? ?REFERRING PROVIDER: Denita Lung, MD ? ?REFERRING DIAG: M25.561 (ICD-10-CM) - Acute pain of right knee  ? ? ?THERAPY DIAG:  ?Difficulty in walking, not elsewhere classified ? ?Muscle weakness (generalized) ? ?Localized edema ? ?Acute  pain of right knee ? ?ONSET DATE: About a month ago ? ?SUBJECTIVE:  ? ?SUBJECTIVE STATEMENT: ?[name] notes less overall pain and improved function since starting PT.  He has not taken an Aleve "in a while."  He also reports doing more yard work. ? ?PERTINENT HISTORY: ?Asthma, HTN, cardiac history, former smoker ? ?PAIN:  ?Are you having pain? No ?NPRS scale: Over the past week 0-2/10 ?Pain location: R medial knee ?Pain orientation: Right and Medial  ?PAIN TYPE: aching ?Pain description: intermittent  ?Aggravating factors: Sit to stand and with prolonged weight bearing ?Relieving factors: Aleve ? ?PRECAUTIONS: None ? ?WEIGHT BEARING RESTRICTIONS No ? ?FALLS:  ?Has patient fallen in last 6 months? No, Number of falls: 0 ? ?OCCUPATION: Retired ? ?PLOF: Independent ? ?PATIENT GOALS Return to yard work and full function without reaggravation ? ? ?OBJECTIVE:  ? ?DIAGNOSTIC FINDINGS: [name] has noticeable thigh atrophy on his affected R leg.  AROM is good and his symptoms have improved since starting Aleve. ? ?PATIENT SURVEYS:  ?FOTO 91 (Goal 71 in 12 visits, was 55) ? ?COGNITION: ? Overall cognitive status: Within functional limits for tasks assessed   ?  ? ?PALPATION: ?No joint line tenderness nor tenderness of pes anserine. ? ?LE AROM/PROM: ? ?AROM Right ?02/24/2022 Left ?02/24/2022  ?  Hip flexion    ?Hip extension    ?Hip abduction    ?Hip adduction    ?Hip internal rotation    ?Hip external rotation    ?Knee flexion 130 131  ?Knee extension -1 0  ?Ankle dorsiflexion    ?Ankle plantarflexion    ?Ankle inversion    ?Ankle eversion    ? (Blank rows = not tested) ? ?LE MMT: ? ?Thigh girth assessment Right ?02/24/2022 Left ?02/24/2022  ?15 cm above superior patellar pole 58 cm 60 cm  ?Hip extension    ?Hip abduction    ?Hip adduction    ?Hip internal rotation    ?Hip external rotation    ?Knee flexion    ?Knee extension    ?Ankle dorsiflexion    ?Ankle plantarflexion    ?Ankle inversion    ?Ankle eversion    ? (Blank rows = not  tested) ? ? ?TODAY'S TREATMENT: ?03/24/2022 ? ?Aerobic: Recumbent bike L3 X 8 min Seat 9  Level 6 ? ?Supine: Quadriceps sets 10X 5 seconds ? ?Seated: SLR 3 sets of 5 (toes back, press knee down and tighten thigh) ? ?Seated tailgate knee flexion AROM 1 minute ? ?Knee extension machine (90-40 degrees with slow eccentrics) 35# 15X ? ?Functional Activities (for stairs and sit to stand): Leg press DL 100# 15X, then R leg only 50# 15X slow eccentrics ? ?4 inch step up/down B X 10 slow eccentrics with one UE support ? ?Neuromuscular Re-education: Single-leg balance 3X 20 seconds ? ? ?03/17/2022 ? ?Aerobic: Recumbent bike L3 X 5 min Seat 9 ? ?Supine: Quadriceps sets 10X 5 seconds ? ?Seated: SLR 3 sets of 5 (toes back, press knee down and tighten thigh) ?Seated tailgate knee flexion AROM 1 minute ?Knee extension machine (90-40 degrees with slow eccentrics) 35# 15X ? ?Functional Activities (for stairs and sit to stand): Leg press DL 100# 15X, then R leg only 50# 15X slow eccentrics ? ?4 inch step up/down B X 10 slow eccentrics with one UE support ? ?Neuromuscular Re-education: ? ? ?03/10/22 ?Therapeutic Exercise: ? Aerobic: Recumbent bike L3 X 6 min ? Supine: Quadriceps sets 10X 5 seconds ? Prone: ? Seated: SLR 3 sets of 5 ? Seated tailgate knee flexion AROM 1 minutes ?  ?Machines: Leg press DL 100# 2X15, then Rt leg only 50# 2X15, leg ext machine 5# up with both down with Rt only 2X15. Hamstring curl machine 25# DL 2X15 ? ?Neuromuscular Re-education: ?Manual Therapy: ?Therapeutic Activity: ?Self Care: ?Trigger Point Dry Needling:  ?Modalities:  ? ? ?PATIENT EDUCATION:  ?Education details: Reviewed exam findings, knee anatomy and HEP. ?Person educated: Patient ?Education method: Explanation, Demonstration, Tactile cues, Verbal cues, and Handouts ?Education comprehension: verbalized understanding, returned demonstration, verbal cues required, tactile cues required, and needs further education ? ? ?HOME EXERCISE PROGRAM: ?Access  Code: [email or web address] ?URL: [email or web address] ?Date: 02/24/2022 ?Prepared by: Vista Mink ? ?Exercises ?Supine Quadricep Sets - 2-3 x daily - 7 x weekly - 2-3 sets - 10 reps - 5 second hold ?Seated Straight Leg Raise - 2 x daily - 7 x weekly - 3-5 sets - 5 reps - 3 seconds hold ?Tailgate Knee Flexion AAROM - 3 x daily - 7 x weekly - 1 sets - 20-30 reps ? ? ?ASSESSMENT: ? ?CLINICAL IMPRESSION: ?[name] has had minimal R knee pain over the past week, even with an increase in activities.  He has demonstrated independence with his HEP and appears ready to transfer into independent PT.  His HEP was  updated and [name] requested DC from supervised PT. ? ?OBJECTIVE IMPAIRMENTS decreased activity tolerance, decreased endurance, decreased knowledge of condition, difficulty walking, decreased strength, increased edema, impaired perceived functional ability, and pain.  ? ?ACTIVITY LIMITATIONS community activity and yard work.  ? ?PERSONAL FACTORS Asthma, HTN, cardiac history, former smoker are also affecting patient's functional outcome.  ? ? ?REHAB POTENTIAL: Good ? ?CLINICAL DECISION MAKING: Stable/uncomplicated ? ?EVALUATION COMPLEXITY: Low ? ? ?GOALS: ?Goals reviewed with patient? Yes ? ?SHORT TERM GOALS: ? ?[name] will report no R knee pain with no pain medication needed. ?Baseline: Improved with Aleve. ?Target date: 04/21/2022 ?Goal status: Met ?LONG TERM GOALS: ? ?Improve FOTO to 71 in 12 visits. ?Baseline: 51 ?Target date: 05/05/2022 ?Goal status: Met ? ?2.  Improve R quadriceps strength as assessed by decreased thigh atrophy and return to yard work without pain. ?Baseline: Pain affects yard work and 2 cm of thigh atrophy assessed 15 cm proximal to the superior patellar pole. ?Target date: 05/05/2022 ?Goal status: Met ? ?3.  [name] will be independent with his long-term HEP at DC. ?Baseline: Started today. ?Target date: 05/05/2022 ?Goal status: Met ? ?PLAN: ?PT FREQUENCY: 1-2x/week ? ?PT DURATION: 6 weeks ? ?PLANNED  INTERVENTIONS: Therapeutic exercises, Therapeutic activity, Neuromuscular re-education, Balance training, Gait training, Patient/Family education, Stair training, Electrical stimulation, Cryotherapy, Vaso

## 2022-05-12 ENCOUNTER — Telehealth: Payer: Self-pay | Admitting: *Deleted

## 2022-05-12 NOTE — Telephone Encounter (Signed)
[name] is asking for a phone call from Dr Sima Matas to discuss a potential study he would like to participate in and needs to talk about paperwork that Dr Sima Matas would need to sign. Please call at [phone]

## 2022-05-30 ENCOUNTER — Telehealth: Payer: Self-pay | Admitting: Family Medicine

## 2022-05-30 DIAGNOSIS — E291 Testicular hypofunction: Secondary | ICD-10-CM

## 2022-05-30 MED ORDER — TESTOSTERONE 20.25 MG/ACT (1.62%) TD GEL: 2.0000 | Freq: Every day | TRANSDERMAL | 1 refills | Status: DC

## 2022-05-30 NOTE — Telephone Encounter (Signed)
Pt called and is requesting a refill on his testosterone please send to the [place], [place]

## 2022-05-31 ENCOUNTER — Ambulatory Visit: Payer: Managed Care, Other (non HMO)

## 2022-06-09 ENCOUNTER — Ambulatory Visit: Payer: Managed Care, Other (non HMO) | Admitting: Psychology

## 2022-06-13 ENCOUNTER — Other Ambulatory Visit: Payer: Self-pay | Admitting: Family Medicine

## 2022-07-06 ENCOUNTER — Ambulatory Visit: Payer: Managed Care, Other (non HMO) | Admitting: Psychology

## 2022-07-20 ENCOUNTER — Ambulatory Visit: Payer: Managed Care, Other (non HMO) | Admitting: Family Medicine

## 2022-07-20 ENCOUNTER — Encounter: Payer: Self-pay | Admitting: Family Medicine

## 2022-07-20 VITALS — BP 120/80 | HR 56 | Temp 98.2°F | Ht 72.0 in | Wt 235.8 lb

## 2022-07-20 DIAGNOSIS — E611 Iron deficiency: Secondary | ICD-10-CM

## 2022-07-20 LAB — POCT HEMOGLOBIN: Hemoglobin: 12.9 g/dL (ref 11–14.6)

## 2022-07-20 NOTE — Progress Notes (Signed)
   Subjective:    Patient ID: [name], male    DOB: [birth year], 66 y.o.   MRN: [id number]  HPI He is here for a consult concerning low iron.  He gives blood regularly in the last 3 times he gave blood they would not accept it because his studies were too low.  He is taking an iron supplement   Review of Systems     Objective:   Physical Exam Alert and in no distress otherwise not examined       Assessment & Plan:  Low iron - Plan: Hemoglobin, CBC with Differential/Platelet, Iron, TIBC and Ferritin Panel

## 2022-07-21 LAB — CBC WITH DIFFERENTIAL/PLATELET
Basophils Absolute: 0.1 10*3/uL (ref 0.0–0.2)
Basos: 1 %
EOS (ABSOLUTE): 0.4 10*3/uL (ref 0.0–0.4)
Eos: 4 %
Hematocrit: 40.9 % (ref 37.5–51.0)
Hemoglobin: 13 g/dL (ref 13.0–17.7)
Immature Grans (Abs): 0 10*3/uL (ref 0.0–0.1)
Immature Granulocytes: 0 %
Lymphocytes Absolute: 2.1 10*3/uL (ref 0.7–3.1)
Lymphs: 24 %
MCH: 27.1 pg (ref 26.6–33.0)
MCHC: 31.8 g/dL (ref 31.5–35.7)
MCV: 85 fL (ref 79–97)
Monocytes Absolute: 0.5 10*3/uL (ref 0.1–0.9)
Monocytes: 6 %
Neutrophils Absolute: 5.9 10*3/uL (ref 1.4–7.0)
Neutrophils: 65 %
Platelets: 240 10*3/uL (ref 150–450)
RBC: 4.8 x10E6/uL (ref 4.14–5.80)
RDW: 15.5 % — ABNORMAL HIGH (ref 11.6–15.4)
WBC: 8.9 10*3/uL (ref 3.4–10.8)

## 2022-07-21 LAB — IRON,TIBC AND FERRITIN PANEL
Ferritin: 35 ng/mL (ref 30–400)
Iron Saturation: 16 % (ref 15–55)
Iron: 56 ug/dL (ref 38–169)
Total Iron Binding Capacity: 351 ug/dL (ref 250–450)
UIBC: 295 ug/dL (ref 111–343)

## 2022-08-02 ENCOUNTER — Telehealth: Payer: Self-pay | Admitting: Family Medicine

## 2022-08-02 NOTE — Telephone Encounter (Signed)
Called pt and he advised that he has enough to last until his appt. [place]

## 2022-08-02 NOTE — Telephone Encounter (Signed)
Pharmacy sent refill for atorvastatin Hyzaar Please send to the [place], [place]

## 2022-08-05 ENCOUNTER — Encounter: Payer: Self-pay | Admitting: Family Medicine

## 2022-08-05 ENCOUNTER — Ambulatory Visit: Payer: Managed Care, Other (non HMO) | Admitting: Family Medicine

## 2022-08-05 VITALS — BP 122/80 | HR 68 | Temp 97.9°F | Wt 239.4 lb

## 2022-08-05 DIAGNOSIS — M25561 Pain in right knee: Secondary | ICD-10-CM

## 2022-08-05 DIAGNOSIS — S90222A Contusion of left lesser toe(s) with damage to nail, initial encounter: Secondary | ICD-10-CM

## 2022-08-05 NOTE — Progress Notes (Signed)
   Subjective:    Patient ID: [name], male    DOB: [birth year], 66 y.o.   MRN: [id number]  HPI He complains of difficulty with some right knee pain when he kneels on his right knee over the patella.  He noted that the other day he had some drainage from that area and the pain has diminished.  He also notes that his left great toe has some discoloration medially.   Review of Systems     Objective:   Physical Exam Exam of the right knee does show a healing lesion of the patella.  Is not red hot or tender. Exam of the left great toe does show a discoloration to the medial aspect of the toenail in a linear pattern.  It questionably goes to the nailbed.      Assessment & Plan:  Acute pain of right knee  Contusion of toenail of left foot, initial encounter No therapy needed for the knee since it is getting better.  He will return here in 1 month for complete exam and I will reevaluate the toenail.  If the discoloration is still present, I will reevaluate and possibly refer to dermatology.

## 2022-08-08 ENCOUNTER — Other Ambulatory Visit: Payer: Self-pay | Admitting: *Deleted

## 2022-08-08 DIAGNOSIS — R413 Other amnesia: Secondary | ICD-10-CM

## 2022-08-08 MED ORDER — DONEPEZIL HCL 10 MG PO TABS: 10.0000 mg | ORAL_TABLET | ORAL | 0 refills | Status: DC

## 2022-08-15 ENCOUNTER — Telehealth: Payer: Self-pay | Admitting: Family Medicine

## 2022-08-15 ENCOUNTER — Other Ambulatory Visit: Payer: Self-pay

## 2022-08-15 MED ORDER — ATORVASTATIN CALCIUM 20 MG PO TABS: 20.0000 mg | ORAL_TABLET | Freq: Every day | ORAL | 1 refills | Status: DC

## 2022-08-15 MED ORDER — LEVOCETIRIZINE DIHYDROCHLORIDE 5 MG PO TABS: 5.0000 mg | ORAL_TABLET | Freq: Every evening | ORAL | 1 refills | Status: DC

## 2022-08-15 NOTE — Telephone Encounter (Signed)
Fax refill request from [facility]  Atorvastatin 20 mg 90 day supply   Levocetirizine dihydrochloride  '5mg'$  90 day supply

## 2022-08-31 ENCOUNTER — Encounter: Payer: Self-pay | Admitting: Internal Medicine

## 2022-09-28 ENCOUNTER — Other Ambulatory Visit: Payer: Self-pay | Admitting: Neurology

## 2022-09-28 DIAGNOSIS — R413 Other amnesia: Secondary | ICD-10-CM

## 2022-10-04 ENCOUNTER — Encounter: Payer: Self-pay | Admitting: Internal Medicine

## 2022-10-17 ENCOUNTER — Encounter: Payer: Self-pay | Admitting: Internal Medicine

## 2022-10-20 ENCOUNTER — Ambulatory Visit (INDEPENDENT_AMBULATORY_CARE_PROVIDER_SITE_OTHER): Payer: Managed Care, Other (non HMO) | Admitting: Family Medicine

## 2022-10-20 VITALS — BP 122/80 | HR 56 | Ht 73.5 in | Wt 235.4 lb

## 2022-10-20 DIAGNOSIS — G4733 Obstructive sleep apnea (adult) (pediatric): Secondary | ICD-10-CM

## 2022-10-20 DIAGNOSIS — Z23 Encounter for immunization: Secondary | ICD-10-CM

## 2022-10-20 DIAGNOSIS — J309 Allergic rhinitis, unspecified: Secondary | ICD-10-CM

## 2022-10-20 DIAGNOSIS — Z0000 Encounter for general adult medical examination without abnormal findings: Secondary | ICD-10-CM | POA: Diagnosis not present

## 2022-10-20 DIAGNOSIS — E291 Testicular hypofunction: Secondary | ICD-10-CM

## 2022-10-20 DIAGNOSIS — I10 Essential (primary) hypertension: Secondary | ICD-10-CM | POA: Diagnosis not present

## 2022-10-20 DIAGNOSIS — E66811 Obesity, class 1: Secondary | ICD-10-CM

## 2022-10-20 DIAGNOSIS — Z125 Encounter for screening for malignant neoplasm of prostate: Secondary | ICD-10-CM

## 2022-10-20 DIAGNOSIS — E6609 Other obesity due to excess calories: Secondary | ICD-10-CM

## 2022-10-20 DIAGNOSIS — Z860101 Personal history of adenomatous and serrated colon polyps: Secondary | ICD-10-CM

## 2022-10-20 DIAGNOSIS — E785 Hyperlipidemia, unspecified: Secondary | ICD-10-CM

## 2022-10-20 DIAGNOSIS — Z86010 Personal history of colonic polyps: Secondary | ICD-10-CM

## 2022-10-20 DIAGNOSIS — Z9884 Bariatric surgery status: Secondary | ICD-10-CM

## 2022-10-20 DIAGNOSIS — N529 Male erectile dysfunction, unspecified: Secondary | ICD-10-CM

## 2022-10-20 DIAGNOSIS — Z6831 Body mass index (BMI) 31.0-31.9, adult: Secondary | ICD-10-CM

## 2022-10-20 DIAGNOSIS — J4530 Mild persistent asthma, uncomplicated: Secondary | ICD-10-CM | POA: Diagnosis not present

## 2022-10-20 DIAGNOSIS — Z8679 Personal history of other diseases of the circulatory system: Secondary | ICD-10-CM

## 2022-10-20 MED ORDER — TESTOSTERONE 20.25 MG/ACT (1.62%) TD GEL: 2.0000 | Freq: Every day | TRANSDERMAL | 1 refills | Status: DC

## 2022-10-20 MED ORDER — ATORVASTATIN CALCIUM 20 MG PO TABS: 20.0000 mg | ORAL_TABLET | Freq: Every day | ORAL | 1 refills | Status: DC

## 2022-10-20 MED ORDER — FLUTICASONE FUROATE-VILANTEROL 200-25 MCG/ACT IN AEPB: INHALATION_SPRAY | RESPIRATORY_TRACT | 3 refills | Status: DC

## 2022-10-20 MED ORDER — LOSARTAN POTASSIUM-HCTZ 50-12.5 MG PO TABS: 1.0000 | ORAL_TABLET | Freq: Every day | ORAL | 3 refills | Status: DC

## 2022-10-20 NOTE — Progress Notes (Signed)
   Subjective:    Patient ID: [name], male    DOB: 02/13/1956, 66 y.o.   MRN: [id number]  HPI He is here for complete examination.  He was recently laid off and this has had a profound effect on him.  He is now trying to figure out what he is going to do for the next 4 to 5 years.  He continues on his blood pressure medication and is having no difficulty with that.  His allergies seem to be under good control.  Continues on Lipitor without difficulty.  He does have underlying memory issues and has also had difficulty with OSA and getting a CPAP that works correctly.  Apparently He Does Not Qualify for Inspire due to his weight.  He did have some neuropsych testing but was given a $500 bill that he does not plan to pay.  He is taking Aricept 10 mg.  His allergies are under good control on Breo.  He continues on testosterone for treatment of his underlying low testosterone.  He does have erectile dysfunction but presently is not interested in taking any medication.  He does have a previous history of adenomatous colonic polyp and does need follow-up on it.  Has remote history of atrial fibs.  His weight is stable.  He has had Lap-Band surgery and did get follow-up within the last year.   Review of Systems  All other systems reviewed and are negative.      Objective:   Physical Exam Alert and in no distress. Tympanic membranes and canals are normal. Pharyngeal area is normal. Neck is supple without adenopathy or thyromegaly. Cardiac exam shows a regular sinus rhythm without murmurs or gallops. Lungs are clear to auscultation.  Abdominal exam shows a lesion in the right upper quadrant which is the port for the Lap-Band.        Assessment & Plan:  Routine general medical examination at a health care facility - Plan: CBC with Differential/Platelet, Comprehensive metabolic panel, Lipid panel  Essential hypertension - Plan: CBC with Differential/Platelet, Comprehensive metabolic  panel  Allergic rhinitis, unspecified seasonality, unspecified trigger  Mild persistent asthma without complication  OSA on CPAP  Hypogonadism male - Plan: Testosterone  Erectile dysfunction, unspecified erectile dysfunction type  History of adenomatous polyp of colon - Plan: Ambulatory referral to Gastroenterology  History of atrial fibrillation  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

## 2022-10-21 LAB — TESTOSTERONE: Testosterone: 274 ng/dL (ref 264–916)

## 2022-10-21 LAB — CBC WITH DIFFERENTIAL/PLATELET
Basophils Absolute: 0 10*3/uL (ref 0.0–0.2)
Basos: 1 %
EOS (ABSOLUTE): 0.4 10*3/uL (ref 0.0–0.4)
Eos: 6 %
Hematocrit: 43.8 % (ref 37.5–51.0)
Hemoglobin: 14.4 g/dL (ref 13.0–17.7)
Immature Grans (Abs): 0 10*3/uL (ref 0.0–0.1)
Immature Granulocytes: 0 %
Lymphocytes Absolute: 2.2 10*3/uL (ref 0.7–3.1)
Lymphs: 35 %
MCH: 29.3 pg (ref 26.6–33.0)
MCHC: 32.9 g/dL (ref 31.5–35.7)
MCV: 89 fL (ref 79–97)
Monocytes Absolute: 0.5 10*3/uL (ref 0.1–0.9)
Monocytes: 7 %
Neutrophils Absolute: 3.2 10*3/uL (ref 1.4–7.0)
Neutrophils: 51 %
Platelets: 234 10*3/uL (ref 150–450)
RBC: 4.92 x10E6/uL (ref 4.14–5.80)
RDW: 12.9 % (ref 11.6–15.4)
WBC: 6.3 10*3/uL (ref 3.4–10.8)

## 2022-10-21 LAB — PSA: Prostate Specific Ag, Serum: 0.7 ng/mL (ref 0.0–4.0)

## 2022-10-21 LAB — COMPREHENSIVE METABOLIC PANEL
ALT: 30 IU/L (ref 0–44)
AST: 33 IU/L (ref 0–40)
Albumin/Globulin Ratio: 1.3 (ref 1.2–2.2)
Albumin: 4.1 g/dL (ref 3.9–4.9)
Alkaline Phosphatase: 86 IU/L (ref 44–121)
BUN/Creatinine Ratio: 10 (ref 10–24)
BUN: 13 mg/dL (ref 8–27)
Bilirubin Total: 0.3 mg/dL (ref 0.0–1.2)
CO2: 26 mmol/L (ref 20–29)
Calcium: 9.1 mg/dL (ref 8.6–10.2)
Chloride: 100 mmol/L (ref 96–106)
Creatinine, Ser: 1.25 mg/dL (ref 0.76–1.27)
Globulin, Total: 3.1 g/dL (ref 1.5–4.5)
Glucose: 98 mg/dL (ref 70–99)
Potassium: 4.1 mmol/L (ref 3.5–5.2)
Sodium: 141 mmol/L (ref 134–144)
Total Protein: 7.2 g/dL (ref 6.0–8.5)
eGFR: 64 mL/min/{1.73_m2} (ref >59)

## 2022-10-21 LAB — LIPID PANEL
Chol/HDL Ratio: 2.8 ratio (ref 0.0–5.0)
Cholesterol, Total: 141 mg/dL (ref 100–199)
HDL: 50 mg/dL (ref >39)
LDL Chol Calc (NIH): 76 mg/dL (ref 0–99)
Triglycerides: 76 mg/dL (ref 0–149)
VLDL Cholesterol Cal: 15 mg/dL (ref 5–40)

## 2022-10-28 ENCOUNTER — Other Ambulatory Visit: Payer: Self-pay | Admitting: Family Medicine

## 2022-10-28 DIAGNOSIS — E785 Hyperlipidemia, unspecified: Secondary | ICD-10-CM

## 2022-11-15 ENCOUNTER — Telehealth: Payer: Self-pay | Admitting: Family Medicine

## 2022-11-15 NOTE — Telephone Encounter (Signed)
Pt has changed insurance and it no longer covers the breo inhaler. He wants to know is it ok if he uses his "emergency inhaler" or does he need to find out from his insurance another inhaler to use.

## 2022-11-22 ENCOUNTER — Other Ambulatory Visit: Payer: Self-pay | Admitting: Family Medicine

## 2022-11-22 DIAGNOSIS — J4520 Mild intermittent asthma, uncomplicated: Secondary | ICD-10-CM

## 2022-11-22 NOTE — Telephone Encounter (Signed)
Is this okay to refill? 

## 2022-11-29 ENCOUNTER — Telehealth: Payer: Self-pay | Admitting: Family Medicine

## 2022-11-29 DIAGNOSIS — E785 Hyperlipidemia, unspecified: Secondary | ICD-10-CM

## 2022-11-29 DIAGNOSIS — E291 Testicular hypofunction: Secondary | ICD-10-CM

## 2022-11-29 DIAGNOSIS — J4530 Mild persistent asthma, uncomplicated: Secondary | ICD-10-CM

## 2022-11-29 DIAGNOSIS — J4520 Mild intermittent asthma, uncomplicated: Secondary | ICD-10-CM

## 2022-11-29 DIAGNOSIS — I10 Essential (primary) hypertension: Secondary | ICD-10-CM

## 2022-11-29 MED ORDER — TESTOSTERONE 20.25 MG/ACT (1.62%) TD GEL: 2.0000 | Freq: Every day | TRANSDERMAL | 1 refills | Status: DC

## 2022-11-29 MED ORDER — ALBUTEROL SULFATE HFA 108 (90 BASE) MCG/ACT IN AERS: 2.0000 | INHALATION_SPRAY | Freq: Four times a day (QID) | RESPIRATORY_TRACT | 1 refills | Status: DC | PRN

## 2022-11-29 MED ORDER — LEVOCETIRIZINE DIHYDROCHLORIDE 5 MG PO TABS: ORAL_TABLET | ORAL | 3 refills | Status: DC

## 2022-11-29 MED ORDER — LOSARTAN POTASSIUM-HCTZ 50-12.5 MG PO TABS: 1.0000 | ORAL_TABLET | Freq: Every day | ORAL | 3 refills | Status: DC

## 2022-11-29 MED ORDER — FLUTICASONE FUROATE-VILANTEROL 200-25 MCG/ACT IN AEPB: INHALATION_SPRAY | RESPIRATORY_TRACT | 3 refills | Status: DC

## 2022-11-29 MED ORDER — ATORVASTATIN CALCIUM 20 MG PO TABS: 20.0000 mg | ORAL_TABLET | Freq: Every day | ORAL | 1 refills | Status: DC

## 2022-11-29 NOTE — Telephone Encounter (Signed)
Sahas would like all of his medications to be transferred to  [place], [place]

## 2022-11-30 ENCOUNTER — Telehealth: Payer: Self-pay | Admitting: Family Medicine

## 2022-11-30 DIAGNOSIS — J4520 Mild intermittent asthma, uncomplicated: Secondary | ICD-10-CM

## 2022-11-30 DIAGNOSIS — E785 Hyperlipidemia, unspecified: Secondary | ICD-10-CM

## 2022-11-30 DIAGNOSIS — I10 Essential (primary) hypertension: Secondary | ICD-10-CM

## 2022-11-30 DIAGNOSIS — E291 Testicular hypofunction: Secondary | ICD-10-CM

## 2022-11-30 MED ORDER — LOSARTAN POTASSIUM-HCTZ 50-12.5 MG PO TABS: 1.0000 | ORAL_TABLET | Freq: Every day | ORAL | 3 refills | Status: DC

## 2022-11-30 MED ORDER — ALBUTEROL SULFATE HFA 108 (90 BASE) MCG/ACT IN AERS: 2.0000 | INHALATION_SPRAY | Freq: Four times a day (QID) | RESPIRATORY_TRACT | 1 refills | Status: DC | PRN

## 2022-11-30 MED ORDER — ATORVASTATIN CALCIUM 20 MG PO TABS: 20.0000 mg | ORAL_TABLET | Freq: Every day | ORAL | 1 refills | Status: DC

## 2022-11-30 MED ORDER — LEVOCETIRIZINE DIHYDROCHLORIDE 5 MG PO TABS: ORAL_TABLET | ORAL | 3 refills | Status: DC

## 2022-11-30 MED ORDER — TESTOSTERONE 20.25 MG/ACT (1.62%) TD GEL: 2.0000 | Freq: Every day | TRANSDERMAL | 1 refills | Status: DC

## 2022-11-30 NOTE — Telephone Encounter (Signed)
Pt called in again and stated he provided the wrong pharmacy yesterday and needs his medications transferred to   [facility], [place]

## 2022-12-03 ENCOUNTER — Telehealth: Payer: Self-pay | Admitting: Family Medicine

## 2022-12-03 NOTE — Telephone Encounter (Signed)
P.A. TESTOSTERONE GEL  °

## 2022-12-08 ENCOUNTER — Other Ambulatory Visit: Payer: Self-pay | Admitting: Family Medicine

## 2022-12-08 DIAGNOSIS — I10 Essential (primary) hypertension: Secondary | ICD-10-CM

## 2022-12-09 ENCOUNTER — Other Ambulatory Visit: Payer: Self-pay | Admitting: Adult Health

## 2022-12-09 DIAGNOSIS — R413 Other amnesia: Secondary | ICD-10-CM

## 2022-12-13 MED ORDER — DONEPEZIL HCL 10 MG PO TABS: 10.0000 mg | ORAL_TABLET | ORAL | 0 refills | Status: DC

## 2022-12-15 NOTE — Telephone Encounter (Signed)
This has been approved. Will fax approval to pharmacy  [facility] 12/03/2022-12/03/23

## 2022-12-16 ENCOUNTER — Telehealth: Payer: Self-pay | Admitting: Internal Medicine

## 2022-12-16 DIAGNOSIS — E291 Testicular hypofunction: Secondary | ICD-10-CM

## 2022-12-16 MED ORDER — DULERA 200-5 MCG/ACT IN AERO: 2.0000 | INHALATION_SPRAY | Freq: Two times a day (BID) | RESPIRATORY_TRACT | 3 refills | Status: DC

## 2022-12-16 NOTE — Telephone Encounter (Signed)
Pt called and states that his Breo and Testosterone are too costly and needs something else sent in.   BREO is not covered by insurance but will cover: Advair HFA, Anoro Ellipta, Arnuity Ellipta, Bevespi, DUlera, Stiolto Respimat. Please advise

## 2022-12-20 MED ORDER — DULERA 200-5 MCG/ACT IN AERO: 2.0000 | INHALATION_SPRAY | Freq: Two times a day (BID) | RESPIRATORY_TRACT | 3 refills | Status: DC

## 2022-12-20 NOTE — Addendum Note (Signed)
Addended by: Minette Headland A on: 12/20/2022 11:52 AM   Modules accepted: Orders

## 2022-12-20 NOTE — Telephone Encounter (Signed)
Per [name] from previous message. Please advise  but there is still the issue with Breo Elippta inhaler.  Is there something else that can be used for this medication? The pharmacist Wells Guiles) at [facility] in [place] mentioned "Advair"

## 2022-12-30 NOTE — Telephone Encounter (Signed)
approved

## 2023-01-04 ENCOUNTER — Other Ambulatory Visit: Payer: Self-pay | Admitting: Adult Health

## 2023-01-04 DIAGNOSIS — R413 Other amnesia: Secondary | ICD-10-CM

## 2023-01-27 ENCOUNTER — Other Ambulatory Visit: Payer: Self-pay | Admitting: Adult Health

## 2023-01-27 DIAGNOSIS — R413 Other amnesia: Secondary | ICD-10-CM

## 2023-01-30 ENCOUNTER — Encounter: Payer: Self-pay | Admitting: Family Medicine

## 2023-01-30 DIAGNOSIS — R413 Other amnesia: Secondary | ICD-10-CM

## 2023-01-30 MED ORDER — DONEPEZIL HCL 10 MG PO TABS: 10.0000 mg | ORAL_TABLET | ORAL | 1 refills | Status: DC

## 2023-02-01 ENCOUNTER — Other Ambulatory Visit: Payer: Self-pay | Admitting: Family Medicine

## 2023-02-01 DIAGNOSIS — R413 Other amnesia: Secondary | ICD-10-CM

## 2023-02-01 MED ORDER — DONEPEZIL HCL 10 MG PO TABS: ORAL_TABLET | ORAL | 0 refills | Status: DC

## 2023-02-06 ENCOUNTER — Other Ambulatory Visit: Payer: Self-pay | Admitting: Family Medicine

## 2023-03-05 ENCOUNTER — Other Ambulatory Visit: Payer: Self-pay | Admitting: Family Medicine

## 2023-03-05 DIAGNOSIS — E291 Testicular hypofunction: Secondary | ICD-10-CM

## 2023-03-06 ENCOUNTER — Telehealth: Payer: Self-pay | Admitting: Family Medicine

## 2023-03-06 NOTE — Telephone Encounter (Signed)
Called pt as Dr Redmond School wants testosterone labs.  Pt will call back and let us know when he will be in the area.

## 2023-03-15 ENCOUNTER — Other Ambulatory Visit: Payer: Self-pay

## 2023-03-15 ENCOUNTER — Other Ambulatory Visit: Payer: Medicare Other

## 2023-03-15 DIAGNOSIS — E291 Testicular hypofunction: Secondary | ICD-10-CM

## 2023-03-16 LAB — TESTOSTERONE: Testosterone: 255 ng/dL — ABNORMAL LOW (ref 264–916)

## 2023-03-28 ENCOUNTER — Telehealth: Payer: Self-pay | Admitting: Family Medicine

## 2023-03-28 NOTE — Telephone Encounter (Signed)
[facility] has provided you with a temporary supply of Donepezil Tab 10 mg.

## 2023-04-01 ENCOUNTER — Encounter: Payer: Self-pay | Admitting: Family Medicine

## 2023-04-04 ENCOUNTER — Other Ambulatory Visit: Payer: Self-pay | Admitting: Family Medicine

## 2023-04-04 DIAGNOSIS — R413 Other amnesia: Secondary | ICD-10-CM

## 2023-04-04 DIAGNOSIS — J4520 Mild intermittent asthma, uncomplicated: Secondary | ICD-10-CM

## 2023-04-04 NOTE — Telephone Encounter (Signed)
Patient advised. Appointment scheduled for tomorrow 04/05/2023 at 1:30pm.       Changes Requested   galantamine (RAZADYNE) 12 MG tablet       Changed from: donepezil (ARICEPT) 10 MG tablet   All pharmacy suggested alternatives are listed below    Possible duplicate: Hover to review recent actions on this medication   Sig: N/A   Disp: Not specified    Refills: 0   Start: 04/04/2023   Class: Normal   Non-formulary For: Memory loss   Last ordered: 2 months ago (02/01/2023) by Ronnald Nian, MD   Last refill: 04/04/2023   Rx #: [id number]   Pharmacy comment: Alternative Requested:NEED PRIOR AUTHORIZATION.  All Pharmacy Suggested Alternatives:  galantamine (RAZADYNE) 12 MG tablet donepezil (ARICEPT ODT) 10 MG disintegrating tablet galantamine (RAZADYNE ER) 24 MG 24 hr capsule  To prescribe one of the alternatives listed above, open the encounter and click Replace

## 2023-04-05 ENCOUNTER — Other Ambulatory Visit: Payer: Self-pay | Admitting: Family Medicine

## 2023-04-05 ENCOUNTER — Ambulatory Visit (INDEPENDENT_AMBULATORY_CARE_PROVIDER_SITE_OTHER): Payer: Medicare Other | Admitting: Family Medicine

## 2023-04-05 ENCOUNTER — Encounter: Payer: Self-pay | Admitting: Family Medicine

## 2023-04-05 VITALS — BP 118/78 | HR 85 | Wt 227.4 lb

## 2023-04-05 DIAGNOSIS — J4520 Mild intermittent asthma, uncomplicated: Secondary | ICD-10-CM | POA: Diagnosis not present

## 2023-04-05 DIAGNOSIS — R413 Other amnesia: Secondary | ICD-10-CM

## 2023-04-05 DIAGNOSIS — G4733 Obstructive sleep apnea (adult) (pediatric): Secondary | ICD-10-CM | POA: Diagnosis not present

## 2023-04-05 DIAGNOSIS — E785 Hyperlipidemia, unspecified: Secondary | ICD-10-CM

## 2023-04-05 MED ORDER — DONEPEZIL HCL 10 MG PO TABS
ORAL_TABLET | ORAL | 0 refills | Status: DC
Start: 2023-04-05 — End: 2023-04-05

## 2023-04-05 MED ORDER — ALBUTEROL SULFATE HFA 108 (90 BASE) MCG/ACT IN AERS
2.0000 | INHALATION_SPRAY | Freq: Four times a day (QID) | RESPIRATORY_TRACT | 1 refills | Status: DC | PRN
Start: 2023-04-05 — End: 2024-01-11

## 2023-04-05 MED ORDER — GALANTAMINE HYDROBROMIDE ER 8 MG PO CP24
8.0000 mg | ORAL_CAPSULE | Freq: Every day | ORAL | 1 refills | Status: DC
Start: 2023-04-05 — End: 2023-04-28

## 2023-04-05 NOTE — Telephone Encounter (Signed)
Refill request pt. Comes in today at 1:30

## 2023-04-05 NOTE — Progress Notes (Signed)
   Subjective:    Patient ID: [name], male    DOB: 1956-04-27, 67 y.o.   MRN: [id number]  HPI He is here for consult concerning memory loss.  He has been on Aricept for quite some time but his insurance will no longer pay for it.  Recommend switching to another medication.  He states that he thinks the Aricept did indeed help with his memory.  He also has OSA and was apparently on CPAP a long time ago but did not tolerate it and for the last several years has not been on this.   Review of Systems     Objective:   Physical Exam Alert and in no distress otherwise not examined       Assessment & Plan:  Memory loss - Plan: DISCONTINUED: donepezil (ARICEPT) 10 MG tablet  OSA (obstructive sleep apnea)  Mild intermittent asthma without complication - Plan: albuterol (VENTOLIN HFA) 108 (90 Base) MCG/ACT inhaler I will switch him to Razadyne 8 mg.  He is to call me in 1 month to let me know how he is doing on that and I will increase if needed.  Discussed possible side effects from this medication and he will keep me informed concerning this.  He is comfortable with that approach. At the end of the encounter he mentioned the fact that he needed a refill on his albuterol inhaler.

## 2023-04-05 NOTE — Patient Instructions (Signed)
Leave a message on MyChart in about a month and let me know what your thoughts are on this medicine

## 2023-04-18 ENCOUNTER — Telehealth: Payer: Self-pay

## 2023-04-18 NOTE — Telephone Encounter (Signed)
Received letter advising pt's PA for testosterone has been approved 12/26/22-12/26/23.

## 2023-04-25 ENCOUNTER — Telehealth: Payer: Self-pay | Admitting: Family Medicine

## 2023-04-25 NOTE — Telephone Encounter (Signed)
Contacted Lillia Pauls to schedule their annual wellness visit. Welcome to Medicare visit Due by 10/27/23. Rudell Cobb AWV direct phone # [phone]  WTM before 10/27/23 per [facility]

## 2023-04-28 ENCOUNTER — Other Ambulatory Visit: Payer: Self-pay | Admitting: Family Medicine

## 2023-05-19 ENCOUNTER — Encounter: Payer: Self-pay | Admitting: Internal Medicine

## 2023-06-03 ENCOUNTER — Other Ambulatory Visit: Payer: Self-pay | Admitting: Family Medicine

## 2023-06-03 DIAGNOSIS — E785 Hyperlipidemia, unspecified: Secondary | ICD-10-CM

## 2023-07-07 ENCOUNTER — Encounter: Payer: Self-pay | Admitting: Internal Medicine

## 2023-07-20 ENCOUNTER — Ambulatory Visit (AMBULATORY_SURGERY_CENTER): Payer: Medicare Other | Admitting: *Deleted

## 2023-07-20 VITALS — Ht 73.5 in | Wt 220.0 lb

## 2023-07-20 DIAGNOSIS — Z86010 Personal history of colonic polyps: Secondary | ICD-10-CM

## 2023-07-20 NOTE — Progress Notes (Signed)
Pt's name and DOB verified at the beginning of the pre-visit.  Pt denies any difficulty with ambulating,sitting, laying down or rolling side to side Gave both [facility] main # and MD on call # prior to instructions.  No egg or soy allergy known to patient  No issues known to pt with past sedation with any surgeries or procedures Pt denies having issues being intubated Pt has no issues moving head neck or swallowing No FH of Malignant Hyperthermia Pt is not on diet pills Pt is not on home 02  Pt is not on blood thinners  Pt denies issues with constipation  Pt is not on dialysis Pt has HX of AFIB no issues per pt  Pt denies any upcoming cardiac testing Pt encouraged to use to use Singlecare or Goodrx to reduce cost  Patient's chart reviewed by Cathlyn Parsons CNRA prior to pre-visit and patient appropriate for the [facility].  Pre-visit completed and red dot placed by patient's name on their procedure day (on provider's schedule).  . Visit by phone Pt states weight is  Instructed pt why it is important to and  to call if they have any changes in health or new medications. Directed them to the # given and on instructions.   Pt states they will.  Instructions reviewed with pt and pt states understanding. Instructed to review again prior to procedure. Pt states they will.  Instructions sent by mail with coupon and by my chart

## 2023-07-23 IMAGING — US US ABDOMINAL AORTA SCREENING AAA
1 series · 9 of 9 positions shown · non-contrast
Comparison: None.

CLINICAL DATA: Male between 65-75 years of age with a smoking
history.

EXAM:
US ABDOMINAL AORTA MEDICARE SCREENING
TECHNIQUE: Ultrasound examination of the abdominal aorta was performed as a
screening evaluation for abdominal aortic aneurysm.

[Series 1: us abdominal aorta screening aaa · 0.28mm/px · 9 of 9 slices shown]
[im 1/9]
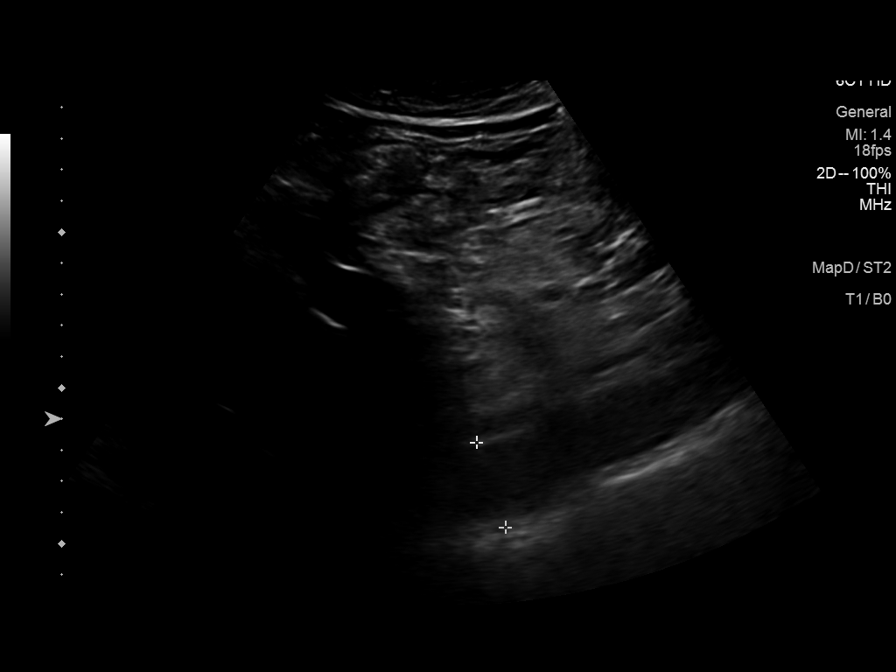
[im 2/9]
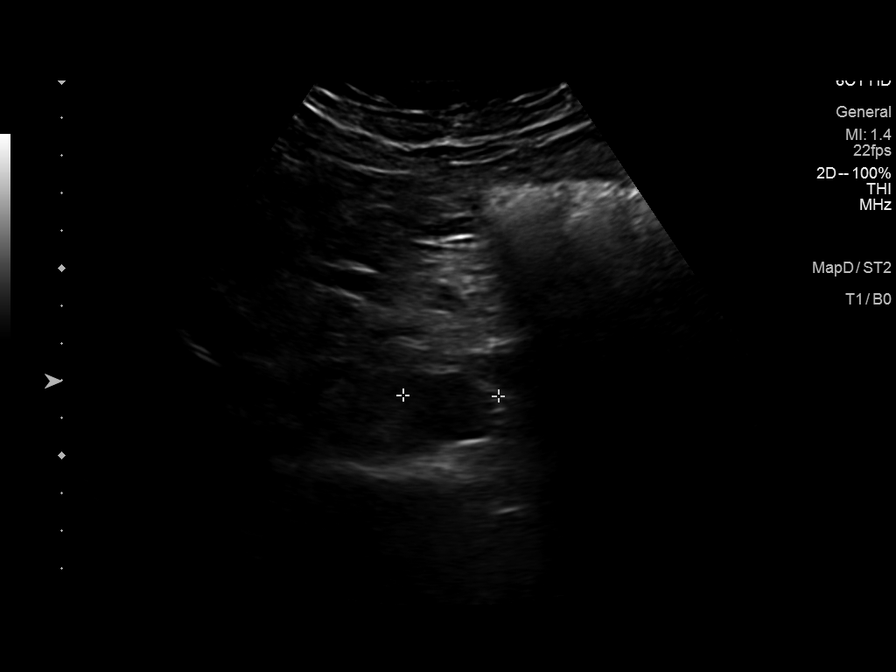
[im 3/9]
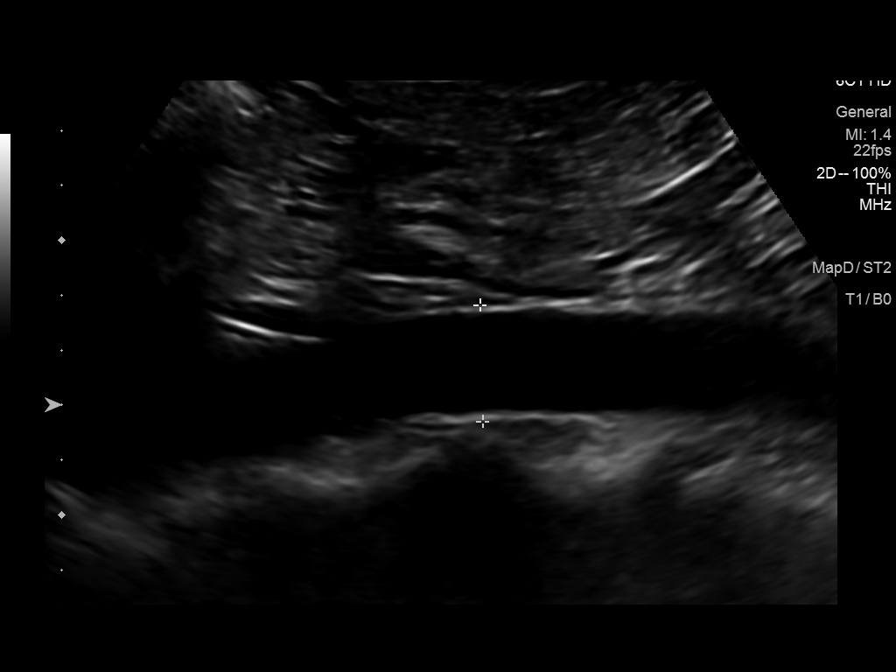
[im 4/9]
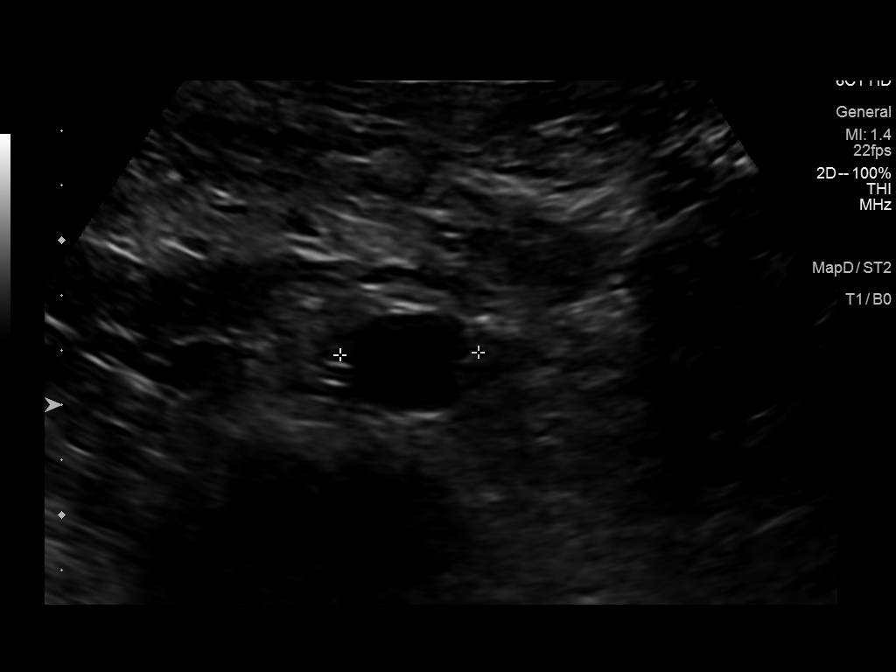
[im 5/9]
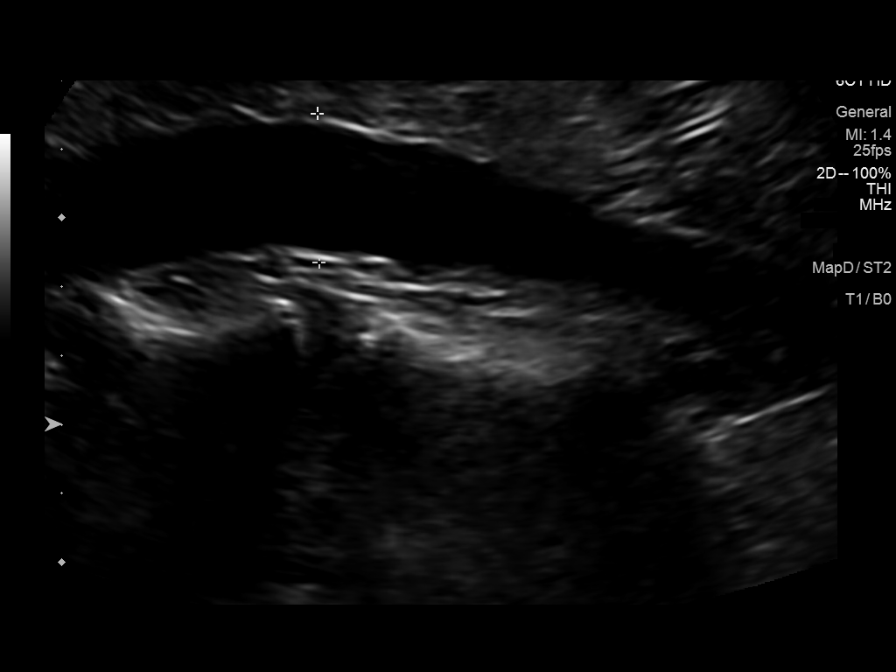
[im 6/9]
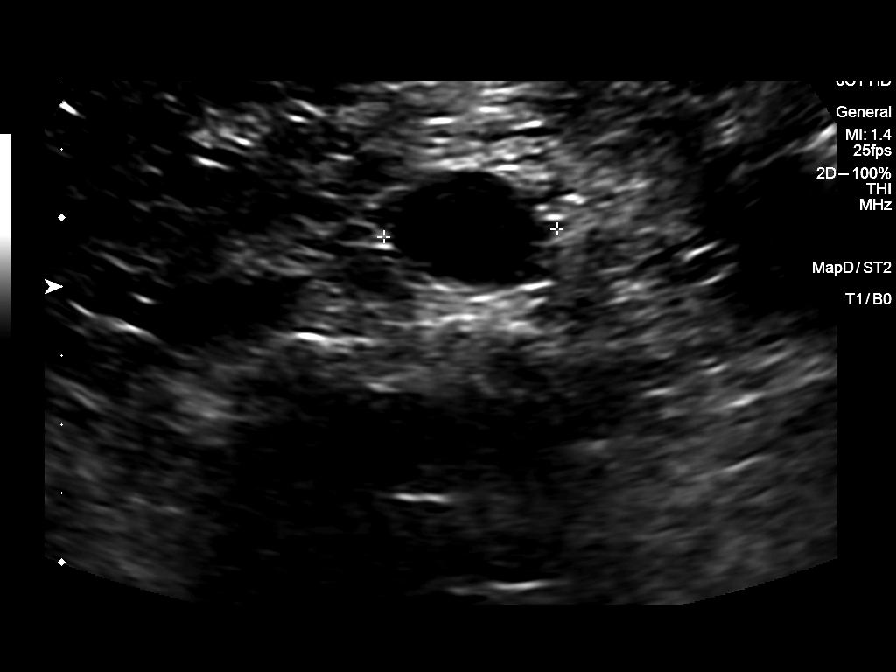
[im 7/9]
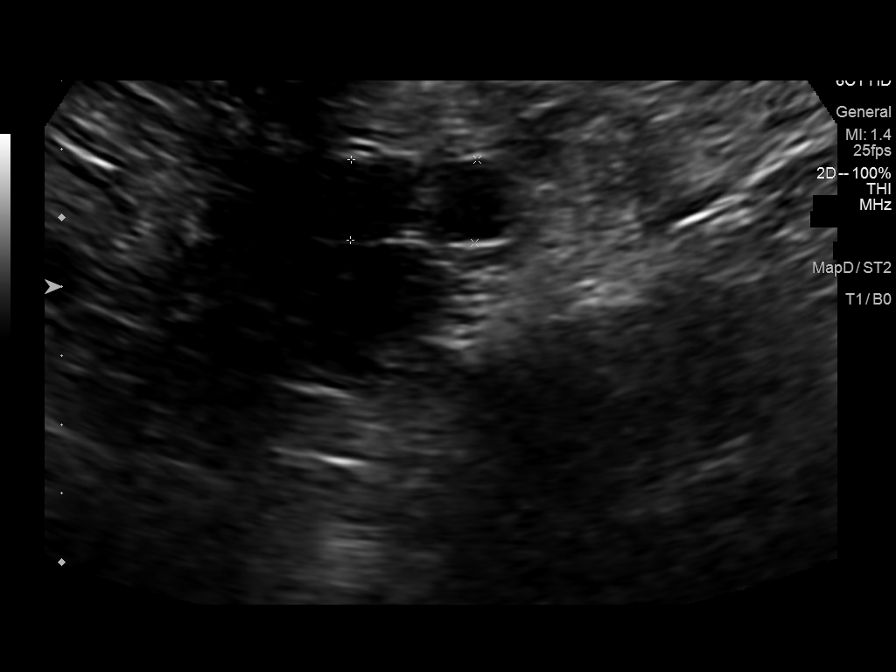
[im 8/9]
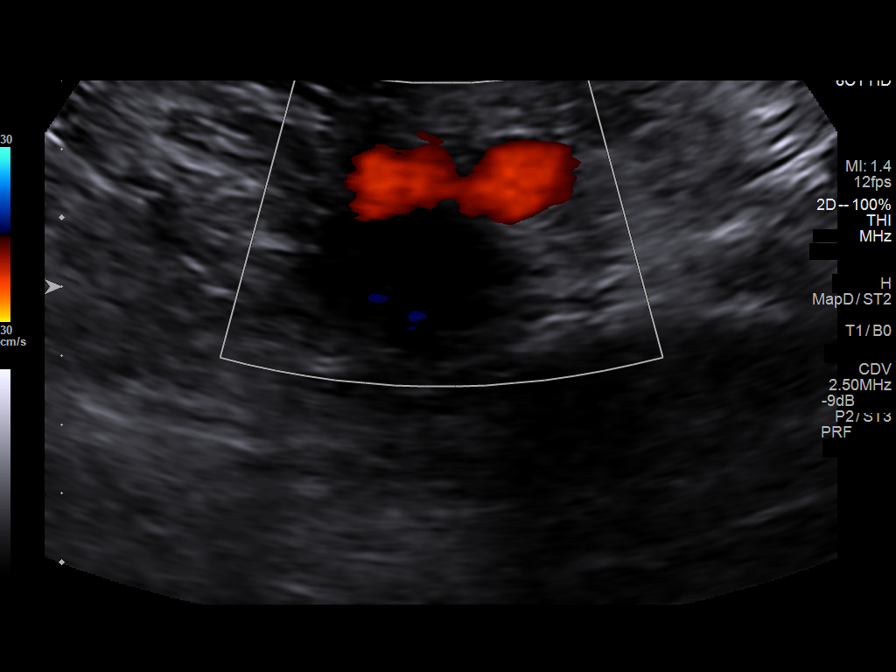
[im 9/9]
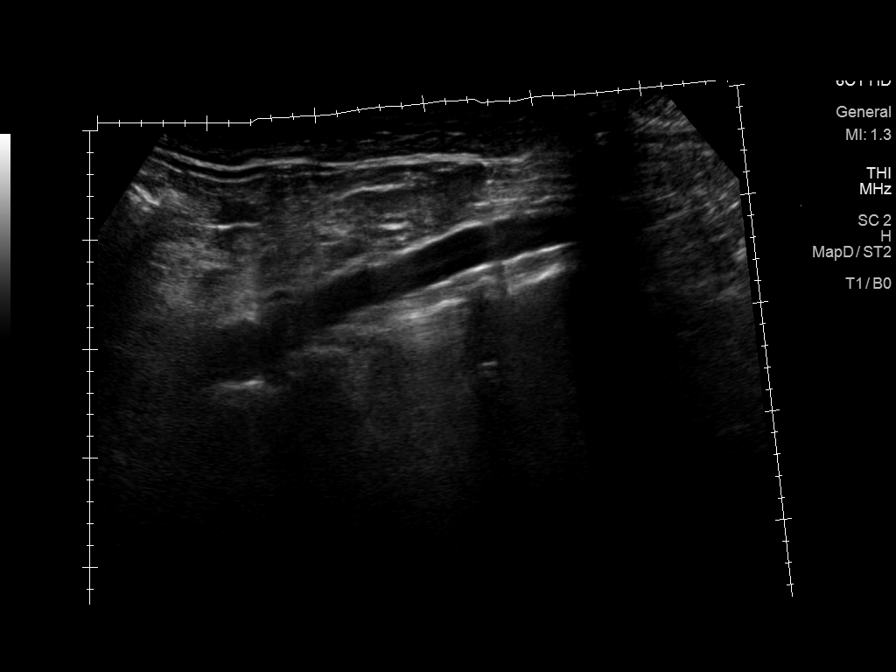

[9 of 9 positions shown; findings below may reference images not displayed]

FINDINGS: Abdominal aortic measurements as follows:

Proximal:  2.5 x 2.7 cm

Mid:  2.1 x 2.5 cm

Distal:  2.2 x 2.5 cm

There is some mild calcified plaque in the distal abdominal aorta.
IMPRESSION: No evidence of abdominal aortic aneurysm. Mild aortic
atherosclerosis identified by ultrasound.

## 2023-08-09 ENCOUNTER — Ambulatory Visit (AMBULATORY_SURGERY_CENTER): Payer: Medicare Other | Admitting: Internal Medicine

## 2023-08-09 ENCOUNTER — Encounter: Payer: Self-pay | Admitting: Internal Medicine

## 2023-08-09 VITALS — BP 106/68 | HR 48 | Temp 98.6°F | Resp 15 | Ht 73.5 in | Wt 220.0 lb

## 2023-08-09 DIAGNOSIS — D120 Benign neoplasm of cecum: Secondary | ICD-10-CM | POA: Diagnosis not present

## 2023-08-09 DIAGNOSIS — Z09 Encounter for follow-up examination after completed treatment for conditions other than malignant neoplasm: Secondary | ICD-10-CM

## 2023-08-09 DIAGNOSIS — Z86010 Personal history of colonic polyps: Secondary | ICD-10-CM

## 2023-08-09 MED ORDER — SODIUM CHLORIDE 0.9 % IV SOLN
500.0000 mL | Freq: Once | INTRAVENOUS | Status: DC
Start: 2023-08-09 — End: 2023-08-09

## 2023-08-09 NOTE — Progress Notes (Signed)
Called to room to assist during endoscopic procedure.  Patient ID and intended procedure confirmed with present staff. Received instructions for my participation in the procedure from the performing physician.  

## 2023-08-09 NOTE — Patient Instructions (Addendum)
Please read handouts provided. Continue present medications. Await pathology results.   YOU HAD AN ENDOSCOPIC PROCEDURE TODAY AT THE [facility]:   Refer to the procedure report that was given to you for any specific questions about what was found during the examination.  If the procedure report does not answer your questions, please call your gastroenterologist to clarify.  If you requested that your care partner not be given the details of your procedure findings, then the procedure report has been included in a sealed envelope for you to review at your convenience later.  YOU SHOULD EXPECT: Some feelings of bloating in the abdomen. Passage of more gas than usual.  Walking can help get rid of the air that was put into your GI tract during the procedure and reduce the bloating. If you had a lower endoscopy (such as a colonoscopy or flexible sigmoidoscopy) you may notice spotting of blood in your stool or on the toilet paper. If you underwent a bowel prep for your procedure, you may not have a normal bowel movement for a few days.  Please Note:  You might notice some irritation and congestion in your nose or some drainage.  This is from the oxygen used during your procedure.  There is no need for concern and it should clear up in a day or so.  SYMPTOMS TO REPORT IMMEDIATELY:  Following lower endoscopy (colonoscopy or flexible sigmoidoscopy):  Excessive amounts of blood in the stool  Significant tenderness or worsening of abdominal pains  Swelling of the abdomen that is new, acute  Fever of 100F or higher  For urgent or emergent issues, a gastroenterologist can be reached at any hour by calling [phone]. Do not use MyChart messaging for urgent concerns.    DIET:  We do recommend a small meal at first, but then you may proceed to your regular diet.  Drink plenty of fluids but you should avoid alcoholic beverages for 24 hours.  ACTIVITY:  You should plan to take it easy for  the rest of today and you should NOT DRIVE or use heavy machinery until tomorrow (because of the sedation medicines used during the test).    FOLLOW UP: Our staff will call the number listed on your records the next business day following your procedure.  We will call around 7:15- 8:00 am to check on you and address any questions or concerns that you may have regarding the information given to you following your procedure. If we do not reach you, we will leave a message.     If any biopsies were taken you will be contacted by phone or by letter within the next 1-3 weeks.  Please call us at [phone] if you have not heard about the biopsies in 3 weeks.    SIGNATURES/CONFIDENTIALITY: You and/or your care partner have signed paperwork which will be entered into your electronic medical record.  These signatures attest to the fact that that the information above on your After Visit Summary has been reviewed and is understood.  Full responsibility of the confidentiality of this discharge information lies with you and/or your care-partner.I found and removed one tiny polyp that looks benign.  You  have diverticulosis - thickened muscle rings and pouches in the colon wall. Please read the handout about this condition.  I will let you know pathology results and when to have another routine colonoscopy by mail and/or My Chart.  I appreciate the opportunity to care for you. Iva Boop,  MD, [facility]

## 2023-08-09 NOTE — Progress Notes (Signed)
[facility] History and Physical   Primary Care Physician:  Ronnald Nian, MD   Reason for Procedure:   Hx colon polyps  Plan:    colonoscopy     HPI: [name] is a 67 y.o. male for surveillance exam  2 diminutive adenomas 2008, no polyps 2013, diminutive adenoma 2017 repeat 2024 per current guidelines Past Medical History:  Diagnosis Date   Allergy    RHINITIS   Asthma    Atrial fibrillation (HCC)    Diverticular hemorrhage    Dyslipidemia    GERD (gastroesophageal reflux disease)    History of colonic polyps    Hyperlipidemia    Hypertension    Hypogonadism male    Obesity    Vitamin D deficiency     Past Surgical History:  Procedure Laterality Date   COLONOSCOPY     ENDOVENOUS ABLATION SAPHENOUS VEIN W/ LASER  02/2011   right leg   LAPAROSCOPIC GASTRIC BANDING  2008   EARLE MD   partial thryoidectomy      Prior to Admission medications   Medication Sig Start Date End Date Taking? Authorizing Provider  aspirin 81 MG tablet Take 81 mg by mouth daily.   Yes [provider]  atorvastatin (LIPITOR) 20 MG tablet TAKE 1 TABLET BY MOUTH EVERY DAY 06/05/23  Yes Ronnald Nian, MD  Cobalamin Combinations (NEURIVA PLUS PO) Take by mouth.   Yes [provider]  Cyanocobalamin (VITAMIN B 12 PO) Take 2,000 mcg by mouth daily.   Yes [provider]  Fe Fum-DSS-C-E-B12-IF-FA (FERRO-PLEX HEMATINIC PO) Take 85 mg by mouth.   Yes [provider]  Ferrous Gluconate-C-Folic Acid (IRON-C PO) Take 65 mg by mouth.   Yes [provider]  galantamine (RAZADYNE ER) 8 MG 24 hr capsule TAKE 1 CAPSULE BY MOUTH DAILY WITH BREAKFAST. 04/28/23  Yes Ronnald Nian, MD  levocetirizine (XYZAL) 5 MG tablet TAKE 1 TABLET EVERY EVENING Patient taking differently: TAKE 1 TABLET EVERY EVENING As needed 02/06/23  Yes Ronnald Nian, MD  losartan-hydrochlorothiazide (HYZAAR) 50-12.5 MG tablet Take 1 tablet by mouth daily. 11/30/22  Yes  Ronnald Nian, MD  Multiple Vitamin (MULTIVITAMIN) tablet Take 1 tablet by mouth daily.    Yes [provider]  Testosterone 20.25 MG/ACT (1.62%) GEL PLACE 2 PUMP ONTO THE SKIN DAILY. 03/06/23  Yes Ronnald Nian, MD  acyclovir (ZOVIRAX) 200 MG capsule TAKE 2 CAPSULES BY MOUTH TWICE DAILY FOR PREVENTION Patient taking differently: TAKE 2 CAPSULES BY MOUTH TWICE DAILY FOR PREVENTION As needed 12/31/21   Ronnald Nian, MD  albuterol (VENTOLIN HFA) 108 (90 Base) MCG/ACT inhaler TAKE 2 PUFFS BY MOUTH EVERY 6 HOURS AS NEEDED FOR WHEEZE OR SHORTNESS OF BREATH 04/05/23   Ronnald Nian, MD  albuterol (VENTOLIN HFA) 108 (90 Base) MCG/ACT inhaler Inhale 2 puffs into the lungs every 6 (six) hours as needed for wheezing or shortness of breath. 04/05/23   Ronnald Nian, MD  BREO ELLIPTA 200-25 MCG/ACT AEPB  12/26/16   [provider]    Current Outpatient Medications  Medication Sig Dispense Refill   aspirin 81 MG tablet Take 81 mg by mouth daily.     atorvastatin (LIPITOR) 20 MG tablet TAKE 1 TABLET BY MOUTH EVERY DAY 90 tablet 1   Cobalamin Combinations (NEURIVA PLUS PO) Take by mouth.     Cyanocobalamin (VITAMIN B 12 PO) Take 2,000 mcg by mouth daily.     Fe Fum-DSS-C-E-B12-IF-FA (FERRO-PLEX HEMATINIC PO)  Take 85 mg by mouth.     Ferrous Gluconate-C-Folic Acid (IRON-C PO) Take 65 mg by mouth.     galantamine (RAZADYNE ER) 8 MG 24 hr capsule TAKE 1 CAPSULE BY MOUTH DAILY WITH BREAKFAST. 90 capsule 1   levocetirizine (XYZAL) 5 MG tablet TAKE 1 TABLET EVERY EVENING (Patient taking differently: TAKE 1 TABLET EVERY EVENING As needed) 90 tablet 1   losartan-hydrochlorothiazide (HYZAAR) 50-12.5 MG tablet Take 1 tablet by mouth daily. 90 tablet 3   Multiple Vitamin (MULTIVITAMIN) tablet Take 1 tablet by mouth daily.      Testosterone 20.25 MG/ACT (1.62%) GEL PLACE 2 PUMP ONTO THE SKIN DAILY. 225 g 1   acyclovir (ZOVIRAX) 200 MG capsule TAKE 2 CAPSULES BY MOUTH TWICE DAILY FOR PREVENTION  (Patient taking differently: TAKE 2 CAPSULES BY MOUTH TWICE DAILY FOR PREVENTION As needed) 360 capsule 1   albuterol (VENTOLIN HFA) 108 (90 Base) MCG/ACT inhaler TAKE 2 PUFFS BY MOUTH EVERY 6 HOURS AS NEEDED FOR WHEEZE OR SHORTNESS OF BREATH 8.5 each 1   albuterol (VENTOLIN HFA) 108 (90 Base) MCG/ACT inhaler Inhale 2 puffs into the lungs every 6 (six) hours as needed for wheezing or shortness of breath. 20.1 g 1   BREO ELLIPTA 200-25 MCG/ACT AEPB      Current Facility-Administered Medications  Medication Dose Route Frequency Provider Last Rate Last Admin   0.9 %  sodium chloride infusion  500 mL Intravenous Once Iva Boop, MD        Allergies as of 08/09/2023 - Review Complete 08/09/2023  Allergen Reaction Noted   Ace inhibitors Swelling 09/16/2020   Codeine Nausea And Vomiting     Family History  Problem Relation Age of Onset   Arthritis Mother    Hypertension Mother    Ulcers Mother    Breast cancer Mother    Emphysema Father    Asthma Maternal Aunt    Heart disease Maternal Uncle    Stroke Maternal Grandmother    Heart disease Paternal Grandmother    Colon cancer Neg Hx    Stomach cancer Neg Hx    Colon polyps Neg Hx    Esophageal cancer Neg Hx    Rectal cancer Neg Hx     Social History   Socioeconomic History   Marital status: Married    Spouse name: Andrey Campanile   Number of children: 1   Years of education: Not on file   Highest education level: Associate degree: occupational, Scientist, product/process development, or vocational program  Occupational History    Comment: [employer]  Tobacco Use   Smoking status: Former    Current packs/day: 0.00    Types: Cigarettes    Quit date: 12/26/1997    Years since quitting: 25.6   Smokeless tobacco: Never  Vaping Use   Vaping status: Never Used  Substance and Sexual Activity   Alcohol use: Yes    Alcohol/week: 1.0 standard drink of alcohol    Types: 1 Glasses of wine per week    Comment: Monthly   Drug use: No   Sexual  activity: Yes  Other Topics Concern   Not on file  Social History Narrative   Lives with wife, 1 son and 1 daughter deceased   56, 3 a day   Social Determinants of Health   Financial Resource Strain: Not on file  Food Insecurity: Not on file  Transportation Needs: Not on file  Physical Activity: Not on file  Stress: Not on file  Social Connections: Not on  file  Intimate Partner Violence: Not on file    Review of Systems:  All other review of systems negative except as mentioned in the HPI.  Physical Exam: Vital signs BP 123/69   Pulse (!) 52   Temp 98.6 F (37 C) (Temporal)   Ht 6' 1.5" (1.867 m)   Wt 220 lb (99.8 kg)   SpO2 95%   BMI 28.63 kg/m   General:   Alert,  Well-developed, well-nourished, pleasant and cooperative in NAD Lungs:  Clear throughout to auscultation.   Heart:  Regular rate and rhythm; no murmurs, clicks, rubs,  or gallops. Abdomen:  Soft, nontender and nondistended. Normal bowel sounds.   Neuro/Psych:  Alert and cooperative. Normal mood and affect. A and O x 3   @Karman Biswell  Sena Slate, MD, [facility] [phone] (pager) 08/09/2023 7:51 AM@

## 2023-08-09 NOTE — Progress Notes (Signed)
VS completed by [facility]   Pt's states no medical or surgical changes since previsit or office visit.

## 2023-08-09 NOTE — Progress Notes (Signed)
Vss nad trans nto pacu 

## 2023-08-09 NOTE — Op Note (Signed)
[facility] Patient Name: [name] Procedure Date: 08/09/2023 7:17 AM MRN: [id number] Endoscopist: Iva Boop , MD, [id number] Age: 67 Referring MD:  Date of Birth: 11-Dec-1956 Gender: Male Account #: [id number] Procedure:                Colonoscopy Indications:              Surveillance: Personal history of adenomatous                            polyps on last colonoscopy > 5 years ago, Last                            colonoscopy: 2017 Medicines:                Monitored Anesthesia Care Procedure:                Pre-Anesthesia Assessment:                           - Prior to the procedure, a History and Physical                            was performed, and patient medications and                            allergies were reviewed. The patient's tolerance of                            previous anesthesia was also reviewed. The risks                            and benefits of the procedure and the sedation                            options and risks were discussed with the patient.                            All questions were answered, and informed consent                            was obtained. Prior Anticoagulants: The patient has                            taken no anticoagulant or antiplatelet agents. ASA                            Grade Assessment: III - A patient with severe                            systemic disease. After reviewing the risks and                            benefits, the patient was deemed in satisfactory  condition to undergo the procedure.                           After obtaining informed consent, the colonoscope                            was passed under direct vision. Throughout the                            procedure, the patient's blood pressure, pulse, and                            oxygen saturations were monitored continuously. The                            CF [id number] #[id number] was introduced through the  anus                            and advanced to the the cecum, identified by                            appendiceal orifice and ileocecal valve. The                            colonoscopy was performed without difficulty. The                            patient tolerated the procedure well. The quality                            of the bowel preparation was good. The ileocecal                            valve, appendiceal orifice, and rectum were                            photographed. Scope In: 8:00:47 AM Scope Out: 8:14:22 AM Scope Withdrawal Time: 0 hours 10 minutes 13 seconds  Total Procedure Duration: 0 hours 13 minutes 35 seconds  Findings:                 The perianal and digital rectal examinations were                            normal.                           A diminutive polyp was found in the cecum. The                            polyp was sessile. The polyp was removed with a                            cold snare. Resection and retrieval were complete.  Verification of patient identification for the                            specimen was done.                           Many diverticula were found in the sigmoid colon                            and descending colon. There was narrowing of the                            colon in association with the diverticular opening.                           The exam was otherwise without abnormality on                            direct and retroflexion views. Complications:            No immediate complications. Estimated Blood Loss:     Estimated blood loss was minimal. Impression:               - One diminutive polyp in the cecum, removed with a                            cold snare. Resected and retrieved.                           - Severe diverticulosis in the sigmoid colon and in                            the descending colon. There was narrowing of the                            colon in association  with the diverticular opening.                           - The examination was otherwise normal on direct                            and retroflexion views.                           - Personal history of colonic polyps. 2 diminutive                            adenomas 2008, no polyps 2013, diminutive adenoma                            2017 Recommendation:           - Patient has a contact number available for                            emergencies. The  signs and symptoms of potential                            delayed complications were discussed with the                            patient. Return to normal activities tomorrow.                            Written discharge instructions were provided to the                            patient.                           - Resume previous diet.                           - Continue present medications.                           - Repeat colonoscopy is recommended for                            surveillance. The colonoscopy date will be                            determined after pathology results from today's                            exam become available for review. Iva Boop, MD 08/09/2023 8:25:45 AM This report has been signed electronically.

## 2023-08-10 ENCOUNTER — Telehealth: Payer: Self-pay | Admitting: *Deleted

## 2023-08-10 NOTE — Telephone Encounter (Signed)
  Follow up Call-     08/09/2023    7:32 AM  Call back number  Post procedure Call Back phone  # [phone]  Permission to leave phone message Yes     Patient questions:  Do you have a fever, pain , or abdominal swelling? No. Pain Score  0 *  Have you tolerated food without any problems? Yes.    Have you been able to return to your normal activities? Yes.    Do you have any questions about your discharge instructions: Diet   No. Medications  No. Follow up visit  No.  Do you have questions or concerns about your Care? No.  Actions: * If pain score is 4 or above: No action needed, pain <4.

## 2023-08-13 ENCOUNTER — Encounter: Payer: Self-pay | Admitting: Internal Medicine

## 2023-08-13 DIAGNOSIS — Z86010 Personal history of colonic polyps: Secondary | ICD-10-CM

## 2023-10-18 ENCOUNTER — Other Ambulatory Visit: Payer: Self-pay | Admitting: Family Medicine

## 2023-10-18 NOTE — Telephone Encounter (Signed)
Pt requesting refill on galantamine but he is wanting to take it 3 times a day now, he says you all have discussed this at previous visit. [phone] - [address],  - [phone]

## 2023-10-19 ENCOUNTER — Telehealth: Payer: Self-pay | Admitting: Family Medicine

## 2023-10-19 NOTE — Telephone Encounter (Signed)
Pt left message needs refill galantamine

## 2023-10-20 MED ORDER — GALANTAMINE HYDROBROMIDE ER 8 MG PO CP24: 8.0000 mg | ORAL_CAPSULE | Freq: Every day | ORAL | 1 refills | Status: DC

## 2023-10-20 NOTE — Telephone Encounter (Signed)
Pt called requesting a refill on galantamine , says he is currently out of medication. [phone] - [address], [place] - [phone]

## 2023-10-21 ENCOUNTER — Other Ambulatory Visit: Payer: Self-pay | Admitting: Family Medicine

## 2023-10-21 DIAGNOSIS — I10 Essential (primary) hypertension: Secondary | ICD-10-CM

## 2023-10-23 ENCOUNTER — Other Ambulatory Visit: Payer: Self-pay | Admitting: Family Medicine

## 2023-10-23 DIAGNOSIS — E785 Hyperlipidemia, unspecified: Secondary | ICD-10-CM

## 2023-10-26 ENCOUNTER — Ambulatory Visit: Payer: Medicare Other | Admitting: Family Medicine

## 2023-11-15 ENCOUNTER — Encounter: Payer: Self-pay | Admitting: Family Medicine

## 2023-11-15 ENCOUNTER — Ambulatory Visit (INDEPENDENT_AMBULATORY_CARE_PROVIDER_SITE_OTHER): Payer: Medicare Other | Admitting: Family Medicine

## 2023-11-15 VITALS — BP 124/82 | HR 56 | Ht 72.5 in | Wt 220.8 lb

## 2023-11-15 DIAGNOSIS — E6609 Other obesity due to excess calories: Secondary | ICD-10-CM

## 2023-11-15 DIAGNOSIS — J4530 Mild persistent asthma, uncomplicated: Secondary | ICD-10-CM

## 2023-11-15 DIAGNOSIS — Z23 Encounter for immunization: Secondary | ICD-10-CM

## 2023-11-15 DIAGNOSIS — I700 Atherosclerosis of aorta: Secondary | ICD-10-CM | POA: Insufficient documentation

## 2023-11-15 DIAGNOSIS — Z125 Encounter for screening for malignant neoplasm of prostate: Secondary | ICD-10-CM

## 2023-11-15 DIAGNOSIS — G4733 Obstructive sleep apnea (adult) (pediatric): Secondary | ICD-10-CM

## 2023-11-15 DIAGNOSIS — I10 Essential (primary) hypertension: Secondary | ICD-10-CM

## 2023-11-15 DIAGNOSIS — Z8679 Personal history of other diseases of the circulatory system: Secondary | ICD-10-CM

## 2023-11-15 DIAGNOSIS — E291 Testicular hypofunction: Secondary | ICD-10-CM

## 2023-11-15 DIAGNOSIS — J301 Allergic rhinitis due to pollen: Secondary | ICD-10-CM

## 2023-11-15 DIAGNOSIS — E785 Hyperlipidemia, unspecified: Secondary | ICD-10-CM | POA: Diagnosis not present

## 2023-11-15 DIAGNOSIS — Z860101 Personal history of adenomatous and serrated colon polyps: Secondary | ICD-10-CM

## 2023-11-15 DIAGNOSIS — Z1211 Encounter for screening for malignant neoplasm of colon: Secondary | ICD-10-CM

## 2023-11-15 DIAGNOSIS — R413 Other amnesia: Secondary | ICD-10-CM

## 2023-11-15 MED ORDER — ATORVASTATIN CALCIUM 20 MG PO TABS: 20.0000 mg | ORAL_TABLET | Freq: Every day | ORAL | 0 refills | Status: DC

## 2023-11-15 MED ORDER — LEVOCETIRIZINE DIHYDROCHLORIDE 5 MG PO TABS: ORAL_TABLET | ORAL | 1 refills | Status: DC

## 2023-11-15 MED ORDER — LOSARTAN POTASSIUM-HCTZ 50-12.5 MG PO TABS: 1.0000 | ORAL_TABLET | Freq: Every day | ORAL | 0 refills | Status: DC

## 2023-11-15 NOTE — Progress Notes (Signed)
[name] is a 67 y.o. male who presents for annual wellness visit and follow-up on chronic medical conditions.  He continues to complain of memory issues.  Most recent medication has had no real good benefit from it.  He wants to pursue this further.  He does have OSA but has not been using the CPAP stating he cannot use it since it tends to come off during the middle of the night.  He does not qualify for Inspire .he does continue on testosterone.  He also continues on Lipitor and does have a history of aortic atherosclerosis.  He has been seen by GI and does have an adenomatous polyp and is scheduled for routine follow-up on that.  Continues on his allergy/asthma medications. Immunizations and Health Maintenance Immunization History  Administered Date(s) Administered   Fluad Quad(high Dose 65+) 10/08/2021, 10/20/2022   Influenza Split 01/12/2012, 10/26/2012, 09/20/2013   Influenza Whole 02/08/2010   Influenza,inj,Quad PF,6+ Mos 12/23/2014, 10/07/2015, 09/02/2016, 09/18/2017, 10/04/2018   Influenza-Unspecified 08/26/2019, 09/03/2020, 09/03/2022, 09/07/2023   PFIZER(Purple Top)SARS-COV-2 Vaccination 04/15/2020, 04/26/2020   Pfizer Covid-19 Vaccine Bivalent Booster 50yrs & up 03/01/2022   Pfizer(Comirnaty)Fall Seasonal Vaccine 12 years and older 10/20/2022   Pneumococcal Conjugate-13 09/16/2020   Pneumococcal Polysaccharide-23 10/08/2021   Tdap 12/17/2009, 09/20/2013   Zoster Recombinant(Shingrix) 09/18/2017, 02/28/2018   Zoster, Live 09/02/2016   Health Maintenance Due  Topic Date Due   Fecal DNA (Cologuard)  Never done   COVID-19 Vaccine (5 - 2023-24 season) 08/27/2023   DTaP/Tdap/Td (3 - Td or Tdap) 09/21/2023    Last colonoscopy: 08/09/23 Last PSA: 10/23 Exercise: no exercise does yard work  Other doctors caring for patient include: Dr. Leone Stokes GI and Dr. Lucia Stokes Neurology.  Advanced Directives: Does Patient Have a Medical Advance Directive?: No Would patient like information  on creating a medical advance directive?: No - Patient declined  Depression screen:  See questionnaire below.        11/15/2023    2:04 PM 10/20/2022    8:39 AM 10/08/2021    8:40 AM 09/16/2020    9:36 AM 09/18/2017    8:35 AM  Depression screen PHQ 2/9  Decreased Interest 0 0 0 0 0  Down, Depressed, Hopeless 0 0 0 3 0  PHQ - 2 Score 0 0 0 3 0  Altered sleeping    0   Tired, decreased energy    0   Change in appetite    0   Feeling bad or failure about yourself     0   Trouble concentrating    3   Moving slowly or fidgety/restless    0   Suicidal thoughts    0   PHQ-9 Score    6   Difficult doing work/chores    Somewhat difficult     Fall Screen: See Questionaire below.      11/15/2023    2:02 PM 04/05/2023    1:40 PM 10/20/2022    8:39 AM 10/08/2021    8:39 AM  Fall Risk   Falls in the past year? 1 0 0 0  Number falls in past yr: 0 0 0 0  Injury with Fall? 1 0 0 0  Comment lt. finger and hand hurting     Risk for fall due to : No Fall Risks No Fall Risks No Fall Risks History of fall(s)  Follow up Falls evaluation completed Falls evaluation completed Falls evaluation completed Falls evaluation completed    ADL screen:  See questionnaire below.  Functional Status Survey: Is the patient deaf or have difficulty hearing?: No Does the patient have difficulty seeing, even when wearing glasses/contacts?: No Does the patient have difficulty concentrating, remembering, or making decisions?: Yes Does the patient have difficulty walking or climbing stairs?: No Does the patient have difficulty dressing or bathing?: No Does the patient have difficulty doing errands alone such as visiting a doctor's office or shopping?: No   Review of Systems  Constitutional: -, -unexpected weight change, -anorexia, -fatigue Allergy: -sneezing, -itching, -congestion Dermatology: denies changing moles, rash, lumps ENT: -runny nose, -ear pain, -sore throat,  Cardiology:  -chest pain,  -palpitations, -orthopnea, Respiratory: -cough, -shortness of breath, -dyspnea on exertion, -wheezing,  Gastroenterology: -abdominal pain, -nausea, -vomiting, -diarrhea, -constipation, -dysphagia Hematology: -bleeding or bruising problems Musculoskeletal: -arthralgias, -myalgias, -joint swelling, -back pain, - Ophthalmology: -vision changes,  Urology: -dysuria, -difficulty urinating,  -urinary frequency, -urgency, incontinence Neurology: -, -numbness, , -memory loss, -falls, -dizziness    PHYSICAL EXAM:  BP 124/82   Pulse (!) 56   Ht 6' 0.5" (1.842 m)   Wt 220 lb 12.8 oz (100.2 kg)   BMI 29.53 kg/m   General Appearance: Alert, cooperative, no distress, appears stated age Head: Normocephalic, without obvious abnormality, atraumatic Eyes: PERRL, conjunctiva/corneas clear, EOM's intact,  Ears: Normal TM's and external ear canals Nose: Nares normal, mucosa normal, no drainage or sinus   tenderness Throat: Lips, mucosa, and tongue normal; teeth and gums normal Neck: Supple, no lymphadenopathy, thyroid:no enlargement/tenderness/nodules; no carotid bruit or JVD Lungs: Clear to auscultation bilaterally without wheezes, rales or ronchi; respirations unlabored Heart: Regular rate and rhythm, S1 and S2 normal, no murmur, rub or gallop Skin: Skin color, texture, turgor normal, no rashes or lesions Lymph nodes: Cervical, supraclavicular, and axillary nodes normal Neurologic: CNII-XII intact, normal strength, sensation and gait; reflexes 2+ and symmetric throughout   Psych: Normal mood, affect, hygiene and grooming  ASSESSMENT/PLAN: Essential hypertension - Plan: CBC with Differential/Platelet, Comprehensive metabolic panel, losartan-hydrochlorothiazide (HYZAAR) 50-12.5 MG tablet  Hypogonadism male  Hyperlipidemia, unspecified hyperlipidemia type - Plan: Lipid panel, atorvastatin (LIPITOR) 20 MG tablet  Screening for prostate cancer  Seasonal allergic rhinitis due to pollen  History of  adenomatous polyp of colon  History of atrial fibrillation  Mild persistent asthma without complication  Obesity due to excess calories with serious comorbidity, unspecified class  OSA (obstructive sleep apnea)  Memory loss - Plan: Ambulatory referral to Neurology  Aortic atherosclerosis (HCC)   He wants to pursue further evaluation and treatment of his memory issues and I will therefore refer him back to neurology to get their input into this.  Otherwise continue on his present medication regimen.  Will need to retest his testosterone since he has not been taking it this on a regular basis. Immunization recommendations discussed.  Colonoscopy recommendations reviewed.   Medicare Attestation I have personally reviewed: The patient's medical and social history Their use of alcohol, tobacco or illicit drugs Their current medications and supplements The patient's functional ability including ADLs,fall risks, home safety risks, cognitive, and hearing and visual impairment Diet and physical activities Evidence for depression or mood disorders  The patient's weight, height, and BMI have been recorded in the chart.  I have made referrals, counseling, and provided education to the patient based on review of the above and I have provided the patient with a written personalized care plan for preventive services.     Sharlot Gowda, MD   11/15/2023

## 2023-11-16 LAB — CBC WITH DIFFERENTIAL/PLATELET
Basophils Absolute: 0 10*3/uL (ref 0.0–0.2)
Basos: 1 %
EOS (ABSOLUTE): 0.2 10*3/uL (ref 0.0–0.4)
Eos: 4 %
Hematocrit: 38.3 % (ref 37.5–51.0)
Hemoglobin: 12.2 g/dL — ABNORMAL LOW (ref 13.0–17.7)
Immature Grans (Abs): 0 10*3/uL (ref 0.0–0.1)
Immature Granulocytes: 0 %
Lymphocytes Absolute: 2.1 10*3/uL (ref 0.7–3.1)
Lymphs: 35 %
MCH: 26.9 pg (ref 26.6–33.0)
MCHC: 31.9 g/dL (ref 31.5–35.7)
MCV: 85 fL (ref 79–97)
Monocytes Absolute: 0.5 10*3/uL (ref 0.1–0.9)
Monocytes: 8 %
Neutrophils Absolute: 3.2 10*3/uL (ref 1.4–7.0)
Neutrophils: 52 %
Platelets: 249 10*3/uL (ref 150–450)
RBC: 4.53 x10E6/uL (ref 4.14–5.80)
RDW: 12.8 % (ref 11.6–15.4)
WBC: 6.1 10*3/uL (ref 3.4–10.8)

## 2023-11-16 LAB — COMPREHENSIVE METABOLIC PANEL
ALT: 16 IU/L (ref 0–44)
AST: 28 [IU]/L (ref 0–40)
Albumin: 4.1 g/dL (ref 3.9–4.9)
Alkaline Phosphatase: 81 IU/L (ref 44–121)
BUN/Creatinine Ratio: 12 (ref 10–24)
BUN: 14 mg/dL (ref 8–27)
Bilirubin Total: 0.6 mg/dL (ref 0.0–1.2)
CO2: 25 mmol/L (ref 20–29)
Calcium: 9 mg/dL (ref 8.6–10.2)
Chloride: 103 mmol/L (ref 96–106)
Creatinine, Ser: 1.19 mg/dL (ref 0.76–1.27)
Globulin, Total: 3 g/dL (ref 1.5–4.5)
Glucose: 89 mg/dL (ref 70–99)
Potassium: 3.8 mmol/L (ref 3.5–5.2)
Sodium: 141 mmol/L (ref 134–144)
Total Protein: 7.1 g/dL (ref 6.0–8.5)
eGFR: 67 mL/min/{1.73_m2} (ref >59)

## 2023-11-16 LAB — LIPID PANEL
Chol/HDL Ratio: 2.6 ratio (ref 0.0–5.0)
Cholesterol, Total: 136 mg/dL (ref 100–199)
HDL: 52 mg/dL (ref >39)
LDL Chol Calc (NIH): 71 mg/dL (ref 0–99)
Triglycerides: 65 mg/dL (ref 0–149)
VLDL Cholesterol Cal: 13 mg/dL (ref 5–40)

## 2023-12-25 ENCOUNTER — Telehealth: Payer: Self-pay | Admitting: Family Medicine

## 2023-12-25 MED ORDER — GALANTAMINE HYDROBROMIDE ER 8 MG PO CP24: 16.0000 mg | ORAL_CAPSULE | Freq: Every day | ORAL | 1 refills | Status: DC

## 2023-12-25 MED ORDER — GALANTAMINE HYDROBROMIDE ER 16 MG PO CP24: 16.0000 mg | ORAL_CAPSULE | Freq: Every day | ORAL | 1 refills | Status: AC

## 2023-12-25 NOTE — Telephone Encounter (Signed)
Pt called about galantamine rx, he states you have him taking 2 a day but rx still showing one, needs new rx  [facility]

## 2024-01-11 ENCOUNTER — Telehealth: Payer: Self-pay | Admitting: Family Medicine

## 2024-01-11 ENCOUNTER — Other Ambulatory Visit: Payer: Self-pay

## 2024-01-11 DIAGNOSIS — E785 Hyperlipidemia, unspecified: Secondary | ICD-10-CM

## 2024-01-11 DIAGNOSIS — J4520 Mild intermittent asthma, uncomplicated: Secondary | ICD-10-CM

## 2024-01-11 DIAGNOSIS — E291 Testicular hypofunction: Secondary | ICD-10-CM

## 2024-01-11 DIAGNOSIS — I10 Essential (primary) hypertension: Secondary | ICD-10-CM

## 2024-01-11 MED ORDER — ALBUTEROL SULFATE HFA 108 (90 BASE) MCG/ACT IN AERS: 2.0000 | INHALATION_SPRAY | Freq: Four times a day (QID) | RESPIRATORY_TRACT | 1 refills | Status: DC | PRN

## 2024-01-11 MED ORDER — ATORVASTATIN CALCIUM 20 MG PO TABS: 20.0000 mg | ORAL_TABLET | Freq: Every day | ORAL | 1 refills | Status: DC

## 2024-01-11 MED ORDER — TESTOSTERONE 20.25 MG/ACT (1.62%) TD GEL: 2.0000 | Freq: Every day | TRANSDERMAL | 1 refills | Status: DC

## 2024-01-11 MED ORDER — LOSARTAN POTASSIUM-HCTZ 50-12.5 MG PO TABS: 1.0000 | ORAL_TABLET | Freq: Every day | ORAL | 1 refills | Status: DC

## 2024-01-11 MED ORDER — LEVOCETIRIZINE DIHYDROCHLORIDE 5 MG PO TABS: ORAL_TABLET | ORAL | 1 refills | Status: DC

## 2024-01-11 NOTE — Telephone Encounter (Signed)
Pt is needing all  medications to be sent to [facility] [address], [place] - [address] and any future refills.

## 2024-01-12 ENCOUNTER — Other Ambulatory Visit: Payer: Self-pay | Admitting: Family Medicine

## 2024-01-16 ENCOUNTER — Telehealth: Payer: Self-pay | Admitting: Family Medicine

## 2024-01-16 DIAGNOSIS — E291 Testicular hypofunction: Secondary | ICD-10-CM

## 2024-01-16 MED ORDER — TESTOSTERONE 20.25 MG/ACT (1.62%) TD GEL: 2.0000 | Freq: Every day | TRANSDERMAL | 1 refills | Status: AC

## 2024-01-16 NOTE — Telephone Encounter (Signed)
[facility] [address] [place]  called   Testosterone sent to wrong pharmacy  needs sent to this location Please resend to this location

## 2024-01-17 ENCOUNTER — Telehealth: Payer: Self-pay

## 2024-01-17 ENCOUNTER — Other Ambulatory Visit (HOSPITAL_COMMUNITY): Payer: Self-pay

## 2024-01-17 NOTE — Telephone Encounter (Signed)
Pharmacy Patient Advocate Encounter  Received notification from [facility] via verbal P/A [phone])  that Prior Authorization/Non-formulary Prior Auth drug for Testosterone 1.62% gel has been APPROVED from 1.1.25 to 1.22.26. Ran test claim, Copay is $143.08 for a 38month supply per pts pref'd pharmacy- [facility]. This test claim was processed through [facility]- copay amounts may vary at other pharmacies due to pharmacy/plan contracts, or as the patient moves through the different stages of their insurance plan.   This is likely due to a deductible. The pt may have this filled at any time at his convenience if he finds this affordable .   PA #/Case ID/Reference #: [id number]

## 2024-01-18 ENCOUNTER — Other Ambulatory Visit: Payer: Self-pay | Admitting: Family Medicine

## 2024-01-18 ENCOUNTER — Other Ambulatory Visit: Payer: Self-pay

## 2024-01-18 DIAGNOSIS — J4520 Mild intermittent asthma, uncomplicated: Secondary | ICD-10-CM

## 2024-01-18 DIAGNOSIS — E785 Hyperlipidemia, unspecified: Secondary | ICD-10-CM

## 2024-01-18 DIAGNOSIS — I10 Essential (primary) hypertension: Secondary | ICD-10-CM

## 2024-01-18 MED ORDER — ALBUTEROL SULFATE HFA 108 (90 BASE) MCG/ACT IN AERS: 2.0000 | INHALATION_SPRAY | Freq: Four times a day (QID) | RESPIRATORY_TRACT | 1 refills | Status: AC | PRN

## 2024-01-18 MED ORDER — FLUTICASONE FUROATE-VILANTEROL 200-25 MCG/ACT IN AEPB: 1.0000 | INHALATION_SPRAY | Freq: Every day | RESPIRATORY_TRACT | 3 refills | Status: DC

## 2024-01-18 MED ORDER — LOSARTAN POTASSIUM-HCTZ 50-12.5 MG PO TABS: 1.0000 | ORAL_TABLET | Freq: Every day | ORAL | 1 refills | Status: AC

## 2024-01-18 MED ORDER — ATORVASTATIN CALCIUM 20 MG PO TABS: 20.0000 mg | ORAL_TABLET | Freq: Every day | ORAL | 1 refills | Status: AC

## 2024-01-18 MED ORDER — LEVOCETIRIZINE DIHYDROCHLORIDE 5 MG PO TABS: ORAL_TABLET | ORAL | 1 refills | Status: AC

## 2024-01-19 ENCOUNTER — Other Ambulatory Visit: Payer: Self-pay | Admitting: Family Medicine

## 2024-01-19 DIAGNOSIS — E785 Hyperlipidemia, unspecified: Secondary | ICD-10-CM

## 2024-01-23 ENCOUNTER — Telehealth: Payer: Self-pay

## 2024-01-23 NOTE — Telephone Encounter (Signed)
Pt called stating Breo inhaler is now $600+. Would like to be sent in something else if you think he still needs it.

## 2024-01-24 NOTE — Telephone Encounter (Signed)
Pt notified and will call insurance

## 2024-02-20 ENCOUNTER — Encounter: Payer: Self-pay | Admitting: Internal Medicine

## 2024-02-28 ENCOUNTER — Telehealth: Payer: Self-pay

## 2024-02-28 MED ORDER — FLUTICASONE-SALMETEROL 250-50 MCG/ACT IN AEPB: 1.0000 | INHALATION_SPRAY | Freq: Two times a day (BID) | RESPIRATORY_TRACT | 3 refills | Status: DC

## 2024-02-28 NOTE — Telephone Encounter (Signed)
 Copied from CRM [id number]. Topic: Clinical - Prescription Issue >> Feb 28, 2024 12:27 PM [name] wrote: Reason for CRM: Patient is needing to see this this inhaler would be able to replace the one sent over previously. He said this one is in the range of $40 while the other was hundreds of dollars. Inhaler is  Fluticasone/salmeterol and if possible [facility] [place]

## 2024-06-25 ENCOUNTER — Other Ambulatory Visit: Payer: Self-pay | Admitting: Family Medicine

## 2024-11-26 ENCOUNTER — Telehealth: Payer: Self-pay | Admitting: Family Medicine

## 2024-11-26 NOTE — Telephone Encounter (Signed)
 Copied from CRM #[id number]. Topic: General - Other >> Nov 26, 2024 10:27 AM Donna BRAVO wrote: Reason for CRM: new provider is and under their care. Adrien Gaskins PA-C  and Stefano Evangelist [facility] ph [phone]

## 2024-11-27 ENCOUNTER — Ambulatory Visit: Payer: Medicare Other | Admitting: Family Medicine

## 2024-11-27 DIAGNOSIS — I10 Essential (primary) hypertension: Secondary | ICD-10-CM

## 2024-11-27 DIAGNOSIS — E291 Testicular hypofunction: Secondary | ICD-10-CM

## 2024-11-27 DIAGNOSIS — J301 Allergic rhinitis due to pollen: Secondary | ICD-10-CM

## 2024-11-27 DIAGNOSIS — Z8719 Personal history of other diseases of the digestive system: Secondary | ICD-10-CM

## 2024-11-27 DIAGNOSIS — I700 Atherosclerosis of aorta: Secondary | ICD-10-CM

## 2024-11-27 DIAGNOSIS — Z860101 Personal history of adenomatous and serrated colon polyps: Secondary | ICD-10-CM

## 2024-11-27 DIAGNOSIS — G4733 Obstructive sleep apnea (adult) (pediatric): Secondary | ICD-10-CM

## 2024-11-27 DIAGNOSIS — Z8679 Personal history of other diseases of the circulatory system: Secondary | ICD-10-CM

## 2024-11-27 DIAGNOSIS — J4530 Mild persistent asthma, uncomplicated: Secondary | ICD-10-CM

## 2024-11-27 DIAGNOSIS — Z0000 Encounter for general adult medical examination without abnormal findings: Secondary | ICD-10-CM

## 2024-11-27 DIAGNOSIS — Z9884 Bariatric surgery status: Secondary | ICD-10-CM

## 2024-11-27 DIAGNOSIS — N5201 Erectile dysfunction due to arterial insufficiency: Secondary | ICD-10-CM
